# Patient Record
Sex: Female | Born: 1955 | Race: White | Hispanic: No | Marital: Married | Smoking: Former smoker
Health system: Southern US, Community
[De-identification: ages and names within clinical notes are randomized; demographics above are authoritative.]

## PROBLEM LIST (undated history)

## (undated) DIAGNOSIS — R011 Cardiac murmur, unspecified: Secondary | ICD-10-CM

## (undated) DIAGNOSIS — I219 Acute myocardial infarction, unspecified: Secondary | ICD-10-CM

## (undated) DIAGNOSIS — I2510 Atherosclerotic heart disease of native coronary artery without angina pectoris: Secondary | ICD-10-CM

## (undated) DIAGNOSIS — I208 Other forms of angina pectoris: Secondary | ICD-10-CM

## (undated) HISTORY — PX: STENT PLACEMENT VASCULAR (ARMC HX): HXRAD1737

## (undated) HISTORY — PX: ABDOMINAL HYSTERECTOMY: SHX81

## (undated) HISTORY — PX: BACK SURGERY: SHX140

---

## 2001-02-18 ENCOUNTER — Encounter: Payer: Self-pay | Admitting: Orthopaedic Surgery

## 2001-02-18 ENCOUNTER — Encounter: Admission: RE | Admit: 2001-02-18 | Discharge: 2001-02-18 | Payer: Self-pay | Admitting: Orthopaedic Surgery

## 2001-03-04 ENCOUNTER — Encounter: Admission: RE | Admit: 2001-03-04 | Discharge: 2001-03-04 | Payer: Self-pay | Admitting: Orthopaedic Surgery

## 2001-03-04 ENCOUNTER — Encounter: Payer: Self-pay | Admitting: Orthopaedic Surgery

## 2001-03-25 ENCOUNTER — Encounter: Payer: Self-pay | Admitting: Orthopaedic Surgery

## 2001-03-25 ENCOUNTER — Encounter: Admission: RE | Admit: 2001-03-25 | Discharge: 2001-03-25 | Payer: Self-pay | Admitting: Orthopaedic Surgery

## 2001-09-09 ENCOUNTER — Encounter: Payer: Self-pay | Admitting: Neurosurgery

## 2001-09-09 ENCOUNTER — Encounter: Admission: RE | Admit: 2001-09-09 | Discharge: 2001-09-09 | Payer: Self-pay | Admitting: Neurosurgery

## 2001-11-06 ENCOUNTER — Encounter: Payer: Self-pay | Admitting: Neurosurgery

## 2001-11-11 ENCOUNTER — Inpatient Hospital Stay (HOSPITAL_COMMUNITY): Admission: RE | Admit: 2001-11-11 | Discharge: 2001-11-12 | Payer: Self-pay | Admitting: Neurosurgery

## 2001-11-11 ENCOUNTER — Encounter: Payer: Self-pay | Admitting: Neurosurgery

## 2001-12-24 ENCOUNTER — Encounter: Payer: Self-pay | Admitting: Neurosurgery

## 2001-12-24 ENCOUNTER — Encounter: Admission: RE | Admit: 2001-12-24 | Discharge: 2001-12-24 | Payer: Self-pay | Admitting: Neurosurgery

## 2002-02-24 ENCOUNTER — Encounter: Admission: RE | Admit: 2002-02-24 | Discharge: 2002-02-24 | Payer: Self-pay | Admitting: Neurosurgery

## 2002-02-24 ENCOUNTER — Encounter: Payer: Self-pay | Admitting: Neurosurgery

## 2002-03-21 ENCOUNTER — Emergency Department (HOSPITAL_COMMUNITY): Admission: EM | Admit: 2002-03-21 | Discharge: 2002-03-21 | Payer: Self-pay | Admitting: Emergency Medicine

## 2002-03-24 ENCOUNTER — Emergency Department (HOSPITAL_COMMUNITY): Admission: EM | Admit: 2002-03-24 | Discharge: 2002-03-24 | Payer: Self-pay | Admitting: *Deleted

## 2002-03-24 ENCOUNTER — Encounter: Payer: Self-pay | Admitting: *Deleted

## 2002-04-30 ENCOUNTER — Inpatient Hospital Stay (HOSPITAL_COMMUNITY): Admission: EM | Admit: 2002-04-30 | Discharge: 2002-05-01 | Payer: Self-pay | Admitting: Cardiology

## 2002-04-30 HISTORY — PX: CORONARY ANGIOPLASTY: SHX604

## 2002-05-05 ENCOUNTER — Inpatient Hospital Stay (HOSPITAL_COMMUNITY): Admission: EM | Admit: 2002-05-05 | Discharge: 2002-05-06 | Payer: Self-pay | Admitting: Internal Medicine

## 2002-07-09 ENCOUNTER — Encounter: Admission: RE | Admit: 2002-07-09 | Discharge: 2002-07-09 | Payer: Self-pay | Admitting: Neurosurgery

## 2002-07-09 ENCOUNTER — Encounter: Payer: Self-pay | Admitting: Neurosurgery

## 2003-05-13 ENCOUNTER — Encounter: Admission: RE | Admit: 2003-05-13 | Discharge: 2003-05-13 | Payer: Self-pay | Admitting: Orthopedic Surgery

## 2003-12-16 ENCOUNTER — Ambulatory Visit (HOSPITAL_COMMUNITY): Admission: RE | Admit: 2003-12-16 | Discharge: 2003-12-16 | Payer: Self-pay | Admitting: Orthopedic Surgery

## 2004-05-09 ENCOUNTER — Encounter
Admission: RE | Admit: 2004-05-09 | Discharge: 2004-05-09 | Payer: Self-pay | Admitting: Physical Medicine and Rehabilitation

## 2010-07-07 ENCOUNTER — Ambulatory Visit
Admission: RE | Admit: 2010-07-07 | Discharge: 2010-07-07 | Disposition: A | Discharge status: Home / Self Care | Payer: Self-pay | Source: Ambulatory Visit | Attending: Neurosurgery | Admitting: Neurosurgery

## 2010-07-07 ENCOUNTER — Other Ambulatory Visit: Payer: Self-pay | Admitting: Neurosurgery

## 2010-07-07 DIAGNOSIS — M549 Dorsalgia, unspecified: Secondary | ICD-10-CM

## 2021-08-27 ENCOUNTER — Encounter (HOSPITAL_COMMUNITY)
Admission: EM | Disposition: A | Discharge status: Rehabilitation Facility | Payer: Self-pay | Source: Home / Self Care | Attending: Neurosurgery

## 2021-08-27 ENCOUNTER — Inpatient Hospital Stay (HOSPITAL_COMMUNITY): Payer: Medicare Other

## 2021-08-27 ENCOUNTER — Encounter (HOSPITAL_COMMUNITY): Payer: Self-pay | Admitting: Anesthesiology

## 2021-08-27 ENCOUNTER — Emergency Department (HOSPITAL_COMMUNITY): Payer: Medicare Other

## 2021-08-27 ENCOUNTER — Encounter (HOSPITAL_COMMUNITY): Payer: Self-pay | Admitting: Certified Registered Nurse Anesthetist

## 2021-08-27 ENCOUNTER — Encounter (HOSPITAL_COMMUNITY): Payer: Self-pay

## 2021-08-27 ENCOUNTER — Inpatient Hospital Stay (HOSPITAL_COMMUNITY): Payer: Medicare Other | Admitting: Anesthesiology

## 2021-08-27 ENCOUNTER — Other Ambulatory Visit: Payer: Self-pay

## 2021-08-27 ENCOUNTER — Inpatient Hospital Stay (HOSPITAL_COMMUNITY)
Admission: EM | Admit: 2021-08-27 | Discharge: 2021-09-12 | DRG: 020 | Disposition: A | Discharge status: Rehabilitation Facility | Payer: Medicare Other | Attending: Neurosurgery | Admitting: Neurosurgery

## 2021-08-27 DIAGNOSIS — Z880 Allergy status to penicillin: Secondary | ICD-10-CM

## 2021-08-27 DIAGNOSIS — E8729 Other acidosis: Secondary | ICD-10-CM | POA: Diagnosis not present

## 2021-08-27 DIAGNOSIS — Z9911 Dependence on respirator [ventilator] status: Secondary | ICD-10-CM

## 2021-08-27 DIAGNOSIS — I6201 Nontraumatic acute subdural hemorrhage: Secondary | ICD-10-CM | POA: Diagnosis present

## 2021-08-27 DIAGNOSIS — R410 Disorientation, unspecified: Secondary | ICD-10-CM

## 2021-08-27 DIAGNOSIS — D539 Nutritional anemia, unspecified: Secondary | ICD-10-CM | POA: Diagnosis not present

## 2021-08-27 DIAGNOSIS — E669 Obesity, unspecified: Secondary | ICD-10-CM | POA: Diagnosis present

## 2021-08-27 DIAGNOSIS — E878 Other disorders of electrolyte and fluid balance, not elsewhere classified: Secondary | ICD-10-CM | POA: Diagnosis present

## 2021-08-27 DIAGNOSIS — R1310 Dysphagia, unspecified: Secondary | ICD-10-CM | POA: Diagnosis present

## 2021-08-27 DIAGNOSIS — E875 Hyperkalemia: Secondary | ICD-10-CM | POA: Diagnosis not present

## 2021-08-27 DIAGNOSIS — Z7982 Long term (current) use of aspirin: Secondary | ICD-10-CM

## 2021-08-27 DIAGNOSIS — E162 Hypoglycemia, unspecified: Secondary | ICD-10-CM | POA: Diagnosis not present

## 2021-08-27 DIAGNOSIS — F5101 Primary insomnia: Secondary | ICD-10-CM | POA: Diagnosis not present

## 2021-08-27 DIAGNOSIS — I209 Angina pectoris, unspecified: Secondary | ICD-10-CM

## 2021-08-27 DIAGNOSIS — R7989 Other specified abnormal findings of blood chemistry: Secondary | ICD-10-CM | POA: Diagnosis not present

## 2021-08-27 DIAGNOSIS — Z9181 History of falling: Secondary | ICD-10-CM

## 2021-08-27 DIAGNOSIS — G936 Cerebral edema: Secondary | ICD-10-CM | POA: Diagnosis present

## 2021-08-27 DIAGNOSIS — I6032 Nontraumatic subarachnoid hemorrhage from left posterior communicating artery: Secondary | ICD-10-CM | POA: Diagnosis present

## 2021-08-27 DIAGNOSIS — S065XAA Traumatic subdural hemorrhage with loss of consciousness status unknown, initial encounter: Secondary | ICD-10-CM

## 2021-08-27 DIAGNOSIS — R0602 Shortness of breath: Secondary | ICD-10-CM | POA: Diagnosis not present

## 2021-08-27 DIAGNOSIS — T17908A Unspecified foreign body in respiratory tract, part unspecified causing other injury, initial encounter: Secondary | ICD-10-CM

## 2021-08-27 DIAGNOSIS — J690 Pneumonitis due to inhalation of food and vomit: Secondary | ICD-10-CM | POA: Diagnosis not present

## 2021-08-27 DIAGNOSIS — J9601 Acute respiratory failure with hypoxia: Secondary | ICD-10-CM

## 2021-08-27 DIAGNOSIS — I639 Cerebral infarction, unspecified: Secondary | ICD-10-CM | POA: Diagnosis not present

## 2021-08-27 DIAGNOSIS — R569 Unspecified convulsions: Secondary | ICD-10-CM | POA: Diagnosis present

## 2021-08-27 DIAGNOSIS — G8191 Hemiplegia, unspecified affecting right dominant side: Secondary | ICD-10-CM | POA: Diagnosis present

## 2021-08-27 DIAGNOSIS — Z79899 Other long term (current) drug therapy: Secondary | ICD-10-CM

## 2021-08-27 DIAGNOSIS — D6959 Other secondary thrombocytopenia: Secondary | ICD-10-CM | POA: Diagnosis not present

## 2021-08-27 DIAGNOSIS — I609 Nontraumatic subarachnoid hemorrhage, unspecified: Secondary | ICD-10-CM | POA: Diagnosis present

## 2021-08-27 DIAGNOSIS — R1312 Dysphagia, oropharyngeal phase: Secondary | ICD-10-CM | POA: Diagnosis not present

## 2021-08-27 DIAGNOSIS — G40909 Epilepsy, unspecified, not intractable, without status epilepticus: Secondary | ICD-10-CM | POA: Diagnosis not present

## 2021-08-27 DIAGNOSIS — Z6829 Body mass index (BMI) 29.0-29.9, adult: Secondary | ICD-10-CM

## 2021-08-27 DIAGNOSIS — J9621 Acute and chronic respiratory failure with hypoxia: Secondary | ICD-10-CM | POA: Diagnosis not present

## 2021-08-27 DIAGNOSIS — R197 Diarrhea, unspecified: Secondary | ICD-10-CM | POA: Diagnosis not present

## 2021-08-27 DIAGNOSIS — G9349 Other encephalopathy: Secondary | ICD-10-CM | POA: Diagnosis present

## 2021-08-27 DIAGNOSIS — G441 Vascular headache, not elsewhere classified: Secondary | ICD-10-CM | POA: Diagnosis not present

## 2021-08-27 DIAGNOSIS — R001 Bradycardia, unspecified: Secondary | ICD-10-CM | POA: Diagnosis not present

## 2021-08-27 DIAGNOSIS — G935 Compression of brain: Secondary | ICD-10-CM | POA: Diagnosis present

## 2021-08-27 DIAGNOSIS — S066XAA Traumatic subarachnoid hemorrhage with loss of consciousness status unknown, initial encounter: Secondary | ICD-10-CM

## 2021-08-27 DIAGNOSIS — R630 Anorexia: Secondary | ICD-10-CM | POA: Diagnosis not present

## 2021-08-27 DIAGNOSIS — R739 Hyperglycemia, unspecified: Secondary | ICD-10-CM | POA: Diagnosis present

## 2021-08-27 DIAGNOSIS — D638 Anemia in other chronic diseases classified elsewhere: Secondary | ICD-10-CM | POA: Diagnosis present

## 2021-08-27 DIAGNOSIS — I608 Other nontraumatic subarachnoid hemorrhage: Secondary | ICD-10-CM | POA: Diagnosis not present

## 2021-08-27 DIAGNOSIS — I619 Nontraumatic intracerebral hemorrhage, unspecified: Secondary | ICD-10-CM

## 2021-08-27 DIAGNOSIS — G479 Sleep disorder, unspecified: Secondary | ICD-10-CM | POA: Diagnosis not present

## 2021-08-27 DIAGNOSIS — E876 Hypokalemia: Secondary | ICD-10-CM | POA: Diagnosis not present

## 2021-08-27 DIAGNOSIS — F17210 Nicotine dependence, cigarettes, uncomplicated: Secondary | ICD-10-CM | POA: Diagnosis present

## 2021-08-27 DIAGNOSIS — R29733 NIHSS score 33: Secondary | ICD-10-CM | POA: Diagnosis present

## 2021-08-27 DIAGNOSIS — I610 Nontraumatic intracerebral hemorrhage in hemisphere, subcortical: Secondary | ICD-10-CM | POA: Diagnosis not present

## 2021-08-27 DIAGNOSIS — R451 Restlessness and agitation: Secondary | ICD-10-CM | POA: Diagnosis not present

## 2021-08-27 DIAGNOSIS — Z781 Physical restraint status: Secondary | ICD-10-CM

## 2021-08-27 DIAGNOSIS — Z885 Allergy status to narcotic agent status: Secondary | ICD-10-CM

## 2021-08-27 DIAGNOSIS — R443 Hallucinations, unspecified: Secondary | ICD-10-CM | POA: Diagnosis not present

## 2021-08-27 DIAGNOSIS — G4089 Other seizures: Secondary | ICD-10-CM | POA: Diagnosis not present

## 2021-08-27 DIAGNOSIS — F05 Delirium due to known physiological condition: Secondary | ICD-10-CM | POA: Diagnosis not present

## 2021-08-27 DIAGNOSIS — I611 Nontraumatic intracerebral hemorrhage in hemisphere, cortical: Secondary | ICD-10-CM | POA: Diagnosis not present

## 2021-08-27 DIAGNOSIS — L270 Generalized skin eruption due to drugs and medicaments taken internally: Secondary | ICD-10-CM | POA: Diagnosis not present

## 2021-08-27 HISTORY — PX: IR ANGIOGRAM FOLLOW UP STUDY: IMG697

## 2021-08-27 HISTORY — PX: RADIOLOGY WITH ANESTHESIA: SHX6223

## 2021-08-27 HISTORY — PX: IR ANGIO INTRA EXTRACRAN SEL COM CAROTID INNOMINATE UNI R MOD SED: IMG5359

## 2021-08-27 HISTORY — PX: IR TRANSCATH/EMBOLIZ: IMG695

## 2021-08-27 HISTORY — PX: CRANIOTOMY: SHX93

## 2021-08-27 HISTORY — DX: Other forms of angina pectoris: I20.8

## 2021-08-27 HISTORY — DX: Nontraumatic intracerebral hemorrhage, unspecified: I61.9

## 2021-08-27 HISTORY — PX: IR CT HEAD LTD: IMG2386

## 2021-08-27 LAB — RAPID URINE DRUG SCREEN, HOSP PERFORMED
Amphetamines: NOT DETECTED
Barbiturates: NOT DETECTED
Benzodiazepines: NOT DETECTED
Cocaine: NOT DETECTED
Opiates: NOT DETECTED
Tetrahydrocannabinol: NOT DETECTED

## 2021-08-27 LAB — COMPREHENSIVE METABOLIC PANEL
ALT: 23 U/L (ref 0–44)
AST: 29 U/L (ref 15–41)
Albumin: 4 g/dL (ref 3.5–5.0)
Alkaline Phosphatase: 79 U/L (ref 38–126)
Anion gap: 11 (ref 5–15)
BUN: 15 mg/dL (ref 8–23)
CO2: 22 mmol/L (ref 22–32)
Calcium: 8.7 mg/dL — ABNORMAL LOW (ref 8.9–10.3)
Chloride: 104 mmol/L (ref 98–111)
Creatinine, Ser: 0.77 mg/dL (ref 0.44–1.00)
GFR, Estimated: >60 mL/min (ref >60)
Glucose, Bld: 246 mg/dL — ABNORMAL HIGH (ref 70–99)
Potassium: 3.7 mmol/L (ref 3.5–5.1)
Sodium: 137 mmol/L (ref 135–145)
Total Bilirubin: 0.7 mg/dL (ref 0.3–1.2)
Total Protein: 6.9 g/dL (ref 6.5–8.1)

## 2021-08-27 LAB — CBC WITH DIFFERENTIAL/PLATELET
Abs Immature Granulocytes: 0.11 10*3/uL — ABNORMAL HIGH (ref 0.00–0.07)
Basophils Absolute: 0.1 10*3/uL (ref 0.0–0.1)
Basophils Relative: 0 %
Eosinophils Absolute: 0 10*3/uL (ref 0.0–0.5)
Eosinophils Relative: 0 %
HCT: 46.7 % — ABNORMAL HIGH (ref 36.0–46.0)
Hemoglobin: 15.7 g/dL — ABNORMAL HIGH (ref 12.0–15.0)
Immature Granulocytes: 1 %
Lymphocytes Relative: 12 %
Lymphs Abs: 1.4 10*3/uL (ref 0.7–4.0)
MCH: 32.2 pg (ref 26.0–34.0)
MCHC: 33.6 g/dL (ref 30.0–36.0)
MCV: 95.7 fL (ref 80.0–100.0)
Monocytes Absolute: 0.4 10*3/uL (ref 0.1–1.0)
Monocytes Relative: 3 %
Neutro Abs: 9.3 10*3/uL — ABNORMAL HIGH (ref 1.7–7.7)
Neutrophils Relative %: 84 %
Platelets: 233 10*3/uL (ref 150–400)
RBC: 4.88 MIL/uL (ref 3.87–5.11)
RDW: 12.4 % (ref 11.5–15.5)
WBC: 11.2 10*3/uL — ABNORMAL HIGH (ref 4.0–10.5)
nRBC: 0 % (ref 0.0–0.2)

## 2021-08-27 LAB — CBG MONITORING, ED
Glucose-Capillary: 214 mg/dL — ABNORMAL HIGH (ref 70–99)
Glucose-Capillary: 193 mg/dL — ABNORMAL HIGH (ref 70–99)
Glucose-Capillary: 154 mg/dL — ABNORMAL HIGH (ref 70–99)

## 2021-08-27 LAB — URINALYSIS, ROUTINE W REFLEX MICROSCOPIC
Bacteria, UA: NONE SEEN
Bilirubin Urine: NEGATIVE
Glucose, UA: 150 mg/dL — AB
Ketones, ur: 20 mg/dL — AB
Leukocytes,Ua: NEGATIVE
Nitrite: NEGATIVE
Protein, ur: 30 mg/dL — AB
Specific Gravity, Urine: 1.019 (ref 1.005–1.030)
pH: 6 (ref 5.0–8.0)

## 2021-08-27 LAB — TROPONIN I (HIGH SENSITIVITY)
Troponin I (High Sensitivity): 5 ng/L (ref <18)
Troponin I (High Sensitivity): 8 ng/L (ref <18)

## 2021-08-27 LAB — POCT ACTIVATED CLOTTING TIME: Activated Clotting Time: 197 seconds

## 2021-08-27 LAB — TYPE AND SCREEN
ABO/RH(D): B POS
Antibody Screen: NEGATIVE

## 2021-08-27 LAB — SODIUM: Sodium: 138 mmol/L (ref 135–145)

## 2021-08-27 LAB — PROTIME-INR
INR: 1.3 — ABNORMAL HIGH (ref 0.8–1.2)
Prothrombin Time: 16.4 seconds — ABNORMAL HIGH (ref 11.4–15.2)

## 2021-08-27 LAB — HIV ANTIBODY (ROUTINE TESTING W REFLEX): HIV Screen 4th Generation wRfx: NONREACTIVE

## 2021-08-27 SURGERY — RADIOLOGY WITH ANESTHESIA
Anesthesia: General

## 2021-08-27 SURGERY — IR WITH ANESTHESIA
Anesthesia: Choice

## 2021-08-27 SURGERY — CRANIOTOMY HEMATOMA EVACUATION SUBDURAL
Anesthesia: General | Site: Head | Laterality: Left

## 2021-08-27 MED ORDER — SODIUM CHLORIDE 3 % IV BOLUS
250.0000 mL | Freq: Once | INTRAVENOUS | Status: AC
  Administered 2021-08-27: 250 mL via INTRAVENOUS
  Filled 2021-08-27: qty 500

## 2021-08-27 MED ORDER — CLEVIDIPINE BUTYRATE 0.5 MG/ML IV EMUL: 0.0000 mg/h | INTRAVENOUS | Status: DC

## 2021-08-27 MED ORDER — STROKE: EARLY STAGES OF RECOVERY BOOK
Freq: Once | Status: AC
  Filled 2021-08-27: qty 1

## 2021-08-27 MED ORDER — FENTANYL CITRATE PF 50 MCG/ML IJ SOSY: 50.0000 ug | PREFILLED_SYRINGE | INTRAMUSCULAR | Status: DC | PRN

## 2021-08-27 MED ORDER — NIMODIPINE 6 MG/ML PO SOLN
60.0000 mg | ORAL | Status: DC
  Administered 2021-08-28 (×2): 60 mg
  Filled 2021-08-27 (×2): qty 10

## 2021-08-27 MED ORDER — IOHEXOL 350 MG/ML SOLN
100.0000 mL | Freq: Once | INTRAVENOUS | Status: AC | PRN
  Administered 2021-08-27: 100 mL via INTRAVENOUS

## 2021-08-27 MED ORDER — ONDANSETRON HCL 4 MG/2ML IJ SOLN
INTRAMUSCULAR | Status: AC
  Filled 2021-08-27: qty 2

## 2021-08-27 MED ORDER — HEPARIN SODIUM (PORCINE) 1000 UNIT/ML IJ SOLN
INTRAMUSCULAR | Status: DC | PRN
  Administered 2021-08-27 (×2): 1000 [IU] via INTRAVENOUS

## 2021-08-27 MED ORDER — ONDANSETRON HCL 4 MG/2ML IJ SOLN
INTRAMUSCULAR | Status: DC | PRN
  Administered 2021-08-27: 4 mg via INTRAVENOUS

## 2021-08-27 MED ORDER — ROCURONIUM BROMIDE 10 MG/ML (PF) SYRINGE
PREFILLED_SYRINGE | INTRAVENOUS | Status: AC
  Filled 2021-08-27: qty 10

## 2021-08-27 MED ORDER — SUCCINYLCHOLINE CHLORIDE 200 MG/10ML IV SOSY
PREFILLED_SYRINGE | INTRAVENOUS | Status: AC
  Administered 2021-08-27: 120 mg via INTRAVENOUS
  Filled 2021-08-27: qty 10

## 2021-08-27 MED ORDER — MANNITOL 20 % IV SOLN
80.0000 g | Freq: Once | INTRAVENOUS | Status: DC
  Filled 2021-08-27: qty 500

## 2021-08-27 MED ORDER — KETAMINE HCL 50 MG/5ML IJ SOSY
PREFILLED_SYRINGE | INTRAMUSCULAR | Status: AC
  Filled 2021-08-27: qty 5

## 2021-08-27 MED ORDER — FENTANYL CITRATE PF 50 MCG/ML IJ SOSY
50.0000 ug | PREFILLED_SYRINGE | Freq: Once | INTRAMUSCULAR | Status: AC
  Administered 2021-08-27: 50 ug via INTRAVENOUS
  Filled 2021-08-27: qty 1

## 2021-08-27 MED ORDER — CLEVIDIPINE BUTYRATE 0.5 MG/ML IV EMUL
INTRAVENOUS | Status: AC
  Filled 2021-08-27: qty 50

## 2021-08-27 MED ORDER — PROPOFOL 10 MG/ML IV BOLUS
INTRAVENOUS | Status: DC | PRN
  Administered 2021-08-27: 50 mg via INTRAVENOUS

## 2021-08-27 MED ORDER — PANTOPRAZOLE 2 MG/ML SUSPENSION
40.0000 mg | Freq: Every day | ORAL | Status: DC
  Filled 2021-08-27: qty 20

## 2021-08-27 MED ORDER — SODIUM CHLORIDE 3 % IV SOLN
INTRAVENOUS | Status: AC
  Filled 2021-08-27: qty 500

## 2021-08-27 MED ORDER — IOHEXOL 300 MG/ML  SOLN
100.0000 mL | Freq: Once | INTRAMUSCULAR | Status: AC | PRN
  Administered 2021-08-27: 10 mL via INTRA_ARTERIAL

## 2021-08-27 MED ORDER — LEVETIRACETAM IN NACL 1000 MG/100ML IV SOLN
1000.0000 mg | Freq: Two times a day (BID) | INTRAVENOUS | Status: DC
  Administered 2021-08-28 – 2021-09-02 (×11): 1000 mg via INTRAVENOUS
  Filled 2021-08-27 (×11): qty 100

## 2021-08-27 MED ORDER — FENTANYL CITRATE (PF) 250 MCG/5ML IJ SOLN
INTRAMUSCULAR | Status: AC
  Filled 2021-08-27: qty 5

## 2021-08-27 MED ORDER — LORAZEPAM 2 MG/ML IJ SOLN
INTRAMUSCULAR | Status: AC
  Filled 2021-08-27: qty 1

## 2021-08-27 MED ORDER — LIDOCAINE-EPINEPHRINE 1 %-1:100000 IJ SOLN
INTRAMUSCULAR | Status: DC | PRN
  Administered 2021-08-27: 10 mL

## 2021-08-27 MED ORDER — ROCURONIUM BROMIDE 10 MG/ML (PF) SYRINGE
PREFILLED_SYRINGE | INTRAVENOUS | Status: DC | PRN
  Administered 2021-08-27: 50 mg via INTRAVENOUS
  Administered 2021-08-27: 30 mg via INTRAVENOUS
  Administered 2021-08-27: 50 mg via INTRAVENOUS

## 2021-08-27 MED ORDER — IOHEXOL 300 MG/ML  SOLN
100.0000 mL | Freq: Once | INTRAMUSCULAR | Status: AC | PRN
  Administered 2021-08-27: 60 mL via INTRA_ARTERIAL

## 2021-08-27 MED ORDER — PROPOFOL 1000 MG/100ML IV EMUL
5.0000 ug/kg/min | INTRAVENOUS | Status: DC
  Administered 2021-08-27: 5 ug/kg/min via INTRAVENOUS
  Administered 2021-08-28: 40 ug/kg/min via INTRAVENOUS
  Administered 2021-08-28 (×2): 70 ug/kg/min via INTRAVENOUS
  Administered 2021-08-28: 20 ug/kg/min via INTRAVENOUS
  Administered 2021-08-28: 70 ug/kg/min via INTRAVENOUS
  Administered 2021-08-28: 40 ug/kg/min via INTRAVENOUS
  Administered 2021-08-29: 50 ug/kg/min via INTRAVENOUS
  Filled 2021-08-27 (×9): qty 100

## 2021-08-27 MED ORDER — GELATIN ABSORBABLE 100 EX MISC: CUTANEOUS | Status: DC | PRN

## 2021-08-27 MED ORDER — SODIUM CHLORIDE 0.9 % IV SOLN: INTRAVENOUS | Status: DC | PRN

## 2021-08-27 MED ORDER — BACITRACIN ZINC 500 UNIT/GM EX OINT
TOPICAL_OINTMENT | CUTANEOUS | Status: DC | PRN
  Administered 2021-08-27: 1 via TOPICAL

## 2021-08-27 MED ORDER — PHENYLEPHRINE 80 MCG/ML (10ML) SYRINGE FOR IV PUSH (FOR BLOOD PRESSURE SUPPORT)
PREFILLED_SYRINGE | INTRAVENOUS | Status: AC
  Filled 2021-08-27: qty 10

## 2021-08-27 MED ORDER — ETOMIDATE 2 MG/ML IV SOLN
INTRAVENOUS | Status: AC
  Administered 2021-08-27: 20 mg via INTRAVENOUS
  Filled 2021-08-27: qty 20

## 2021-08-27 MED ORDER — SODIUM CHLORIDE 0.9 % IV SOLN: INTRAVENOUS | Status: DC

## 2021-08-27 MED ORDER — MIDAZOLAM HCL 2 MG/2ML IJ SOLN
INTRAMUSCULAR | Status: AC
  Filled 2021-08-27: qty 2

## 2021-08-27 MED ORDER — LEVETIRACETAM IN NACL 1000 MG/100ML IV SOLN
INTRAVENOUS | Status: AC
  Administered 2021-08-27: 1000 mg via INTRAVENOUS
  Filled 2021-08-27: qty 200

## 2021-08-27 MED ORDER — IOHEXOL 300 MG/ML  SOLN
100.0000 mL | Freq: Once | INTRAMUSCULAR | Status: AC | PRN
  Administered 2021-08-27: 70 mL via INTRA_ARTERIAL

## 2021-08-27 MED ORDER — FENTANYL CITRATE PF 50 MCG/ML IJ SOSY
50.0000 ug | PREFILLED_SYRINGE | Freq: Once | INTRAMUSCULAR | Status: AC
  Administered 2021-08-27: 50 ug via INTRAVENOUS

## 2021-08-27 MED ORDER — ETOMIDATE 2 MG/ML IV SOLN: 20.0000 mg | Freq: Once | INTRAVENOUS | Status: AC

## 2021-08-27 MED ORDER — VANCOMYCIN HCL 1000 MG IV SOLR
INTRAVENOUS | Status: DC | PRN
  Administered 2021-08-27: 1000 mg via INTRAVENOUS

## 2021-08-27 MED ORDER — SUCCINYLCHOLINE CHLORIDE 200 MG/10ML IV SOSY: 120.0000 mg | PREFILLED_SYRINGE | Freq: Once | INTRAVENOUS | Status: AC

## 2021-08-27 MED ORDER — NIMODIPINE 30 MG PO CAPS: 60.0000 mg | ORAL_CAPSULE | ORAL | Status: DC

## 2021-08-27 MED ORDER — PHENYLEPHRINE 80 MCG/ML (10ML) SYRINGE FOR IV PUSH (FOR BLOOD PRESSURE SUPPORT)
PREFILLED_SYRINGE | INTRAVENOUS | Status: DC | PRN
  Administered 2021-08-27 (×2): 80 ug via INTRAVENOUS

## 2021-08-27 MED ORDER — FENTANYL CITRATE PF 50 MCG/ML IJ SOSY
PREFILLED_SYRINGE | INTRAMUSCULAR | Status: AC
  Filled 2021-08-27: qty 2

## 2021-08-27 MED ORDER — ACETAMINOPHEN 325 MG PO TABS: 650.0000 mg | ORAL_TABLET | ORAL | Status: DC | PRN

## 2021-08-27 MED ORDER — SODIUM CHLORIDE 3 % IV SOLN
INTRAVENOUS | Status: DC
  Administered 2021-08-27: 75 mL/h via INTRAVENOUS
  Filled 2021-08-27 (×4): qty 500

## 2021-08-27 MED ORDER — VANCOMYCIN HCL IN DEXTROSE 1-5 GM/200ML-% IV SOLN
INTRAVENOUS | Status: AC
  Filled 2021-08-27: qty 200

## 2021-08-27 MED ORDER — FENTANYL CITRATE (PF) 250 MCG/5ML IJ SOLN
INTRAMUSCULAR | Status: DC | PRN
  Administered 2021-08-27: 50 ug via INTRAVENOUS

## 2021-08-27 MED ORDER — ONDANSETRON HCL 4 MG/2ML IJ SOLN
4.0000 mg | Freq: Once | INTRAMUSCULAR | Status: AC
  Administered 2021-08-27: 4 mg via INTRAVENOUS
  Filled 2021-08-27: qty 2

## 2021-08-27 MED ORDER — CLEVIDIPINE BUTYRATE 0.5 MG/ML IV EMUL
INTRAVENOUS | Status: AC
  Administered 2021-08-27: 2 mg/h via INTRAVENOUS
  Filled 2021-08-27: qty 50

## 2021-08-27 MED ORDER — LEVETIRACETAM IN NACL 1000 MG/100ML IV SOLN
1000.0000 mg | Freq: Once | INTRAVENOUS | Status: AC
  Administered 2021-08-27: 1000 mg via INTRAVENOUS

## 2021-08-27 MED ORDER — ONDANSETRON HCL 4 MG/2ML IJ SOLN: 4.0000 mg | Freq: Four times a day (QID) | INTRAMUSCULAR | Status: DC | PRN

## 2021-08-27 MED ORDER — 0.9 % SODIUM CHLORIDE (POUR BTL) OPTIME
TOPICAL | Status: DC | PRN
Start: 2021-08-27 — End: 2021-08-27
  Administered 2021-08-27: 3000 mL

## 2021-08-27 MED ORDER — HEMOSTATIC AGENTS (NO CHARGE) OPTIME
TOPICAL | Status: DC | PRN
  Administered 2021-08-27: 1 via TOPICAL

## 2021-08-27 MED ORDER — LEVETIRACETAM IN NACL 1000 MG/100ML IV SOLN: 1000.0000 mg | Freq: Once | INTRAVENOUS | Status: AC

## 2021-08-27 MED ORDER — INSULIN ASPART 100 UNIT/ML IJ SOLN
2.0000 [IU] | INTRAMUSCULAR | Status: DC
  Administered 2021-08-28 (×5): 2 [IU] via SUBCUTANEOUS
  Administered 2021-08-28 – 2021-08-29 (×4): 4 [IU] via SUBCUTANEOUS
  Administered 2021-08-29: 2 [IU] via SUBCUTANEOUS
  Administered 2021-08-29: 4 [IU] via SUBCUTANEOUS

## 2021-08-27 SURGICAL SUPPLY — 60 items
ADH SKN CLS APL DERMABOND .7 (GAUZE/BANDAGES/DRESSINGS) ×1
BAG COUNTER SPONGE SURGICOUNT (BAG) ×4 IMPLANT
BAG SPNG CNTER NS LX DISP (BAG) ×2
BLADE CLIPPER SURG (BLADE) ×1 IMPLANT
BLADE SURG 11 STRL SS (BLADE) ×3 IMPLANT
BNDG CMPR 75X41 PLY ABS (GAUZE/BANDAGES/DRESSINGS) ×1
BNDG GAUZE ELAST 4 BULKY (GAUZE/BANDAGES/DRESSINGS) ×1 IMPLANT
BNDG STRETCH 4X75 NS LF (GAUZE/BANDAGES/DRESSINGS) ×1 IMPLANT
BUR ACORN 9.0 PRECISION (BURR) ×3 IMPLANT
BUR SPIRAL ROUTER 2.3 (BUR) ×1 IMPLANT
CANISTER SUCT 3000ML PPV (MISCELLANEOUS) ×3 IMPLANT
CARTRIDGE OIL MAESTRO DRILL (MISCELLANEOUS) ×2 IMPLANT
DERMABOND ADVANCED (GAUZE/BANDAGES/DRESSINGS) ×1
DERMABOND ADVANCED .7 DNX12 (GAUZE/BANDAGES/DRESSINGS) ×2 IMPLANT
DIFFUSER DRILL AIR PNEUMATIC (MISCELLANEOUS) ×3 IMPLANT
DRAIN JACKSON PRT FLT 7MM (DRAIN) ×1 IMPLANT
DRAPE NEUROLOGICAL W/INCISE (DRAPES) ×3 IMPLANT
DRAPE SURG 17X23 STRL (DRAPES) IMPLANT
DRAPE WARM FLUID 44X44 (DRAPES) ×3 IMPLANT
DRSG OPSITE 4X5.5 SM (GAUZE/BANDAGES/DRESSINGS) ×1 IMPLANT
DRSG OPSITE POSTOP 4X6 (GAUZE/BANDAGES/DRESSINGS) ×1 IMPLANT
ELECT REM PT RETURN 9FT ADLT (ELECTROSURGICAL) ×2
ELECTRODE REM PT RTRN 9FT ADLT (ELECTROSURGICAL) ×2 IMPLANT
EVACUATOR SILICONE 100CC (DRAIN) ×1 IMPLANT
GAUZE SPONGE 4X4 12PLY STRL (GAUZE/BANDAGES/DRESSINGS) IMPLANT
GAUZE SPONGE 4X4 12PLY STRL LF (GAUZE/BANDAGES/DRESSINGS) ×1 IMPLANT
GLOVE BIO SURGEON STRL SZ7 (GLOVE) ×1 IMPLANT
GLOVE BIO SURGEON STRL SZ8 (GLOVE) ×3 IMPLANT
GLOVE BIOGEL PI IND STRL 7.0 (GLOVE) IMPLANT
GLOVE BIOGEL PI INDICATOR 7.0 (GLOVE) ×1
GLOVE INDICATOR 8.5 STRL (GLOVE) ×3 IMPLANT
GLOVE SURG UNDER POLY LF SZ8.5 (GLOVE) ×3 IMPLANT
GOWN STRL REUS W/ TWL LRG LVL3 (GOWN DISPOSABLE) ×2 IMPLANT
GOWN STRL REUS W/ TWL XL LVL3 (GOWN DISPOSABLE) ×2 IMPLANT
GOWN STRL REUS W/TWL 2XL LVL3 (GOWN DISPOSABLE) IMPLANT
GOWN STRL REUS W/TWL LRG LVL3 (GOWN DISPOSABLE) ×6
GOWN STRL REUS W/TWL XL LVL3 (GOWN DISPOSABLE) ×2
HEMOSTAT SURGICEL 2X14 (HEMOSTASIS) ×1 IMPLANT
KIT BASIN OR (CUSTOM PROCEDURE TRAY) ×3 IMPLANT
KIT TURNOVER KIT B (KITS) ×3 IMPLANT
NDL HYPO 25X1 1.5 SAFETY (NEEDLE) ×2 IMPLANT
NEEDLE HYPO 25X1 1.5 SAFETY (NEEDLE) ×2 IMPLANT
NS IRRIG 1000ML POUR BTL (IV SOLUTION) ×5 IMPLANT
OIL CARTRIDGE MAESTRO DRILL (MISCELLANEOUS) ×2
PACK CRANIOTOMY CUSTOM (CUSTOM PROCEDURE TRAY) ×3 IMPLANT
PATTIES SURGICAL .25X.25 (GAUZE/BANDAGES/DRESSINGS) IMPLANT
PATTIES SURGICAL .5 X.5 (GAUZE/BANDAGES/DRESSINGS) IMPLANT
PATTIES SURGICAL .5 X3 (DISPOSABLE) IMPLANT
PATTIES SURGICAL 1X1 (DISPOSABLE) IMPLANT
PIN MAYFIELD SKULL DISP (PIN) IMPLANT
SPONGE NEURO XRAY DETECT 1X3 (DISPOSABLE) IMPLANT
SPONGE SURGIFOAM ABS GEL 100 (HEMOSTASIS) ×2 IMPLANT
STAPLER VISISTAT 35W (STAPLE) ×3 IMPLANT
SUT ETHILON 3 0 FSL (SUTURE) IMPLANT
SUT NURALON 4 0 TR CR/8 (SUTURE) ×6 IMPLANT
SUT VIC AB 2-0 CT1 18 (SUTURE) ×4 IMPLANT
TOWEL GREEN STERILE (TOWEL DISPOSABLE) ×3 IMPLANT
TOWEL GREEN STERILE FF (TOWEL DISPOSABLE) ×3 IMPLANT
UNDERPAD 30X36 HEAVY ABSORB (UNDERPADS AND DIAPERS) IMPLANT
WATER STERILE IRR 1000ML POUR (IV SOLUTION) ×3 IMPLANT

## 2021-08-27 NOTE — Anesthesia Preprocedure Evaluation (Addendum)
Anesthesia Evaluation  ?Patient identified by MRN, date of birth, ID band ?Patient unresponsive ? ? ? ?Reviewed: ?Allergy & Precautions, Patient's Chart, lab work & pertinent test results, Unable to perform ROS - Chart review onlyPreop documentation limited or incomplete due to emergent nature of procedure. ? ?Airway ?Mallampati: Intubated ? ? ? ? ? ? Dental ?  ?Pulmonary ?Current Smoker,  ?  ?breath sounds clear to auscultation ? ? ? ? ? ? Cardiovascular ?+ angina at rest  ?Rhythm:Regular Rate:Normal ? ? ?  ?Neuro/Psych ?  ? GI/Hepatic ?  ?Endo/Other  ? ? Renal/GU ?  ? ?  ?Musculoskeletal ? ? Abdominal ?Normal abdominal exam  (+)   ?Peds ? Hematology ?  ?Anesthesia Other Findings ? ? Reproductive/Obstetrics ? ?  ? ? ? ? ? ? ? ? ? ? ? ? ? ?  ?  ? ? ? ? ? ? ? ?Anesthesia Physical ?Anesthesia Plan ? ?ASA: 4 and emergent ? ?Anesthesia Plan: General  ? ?Post-op Pain Management:   ? ?Induction: Inhalational ? ?PONV Risk Score and Plan: 2 and Ondansetron and Dexamethasone ? ?Airway Management Planned: Oral ETT ? ?Additional Equipment: Arterial line ? ?Intra-op Plan:  ? ?Post-operative Plan: Post-operative intubation/ventilation ? ?Informed Consent:  ? ? ? ?History available from chart only and Only emergency history available ? ?Plan Discussed with: CRNA ? ?Anesthesia Plan Comments:   ? ? ? ? ? ?Anesthesia Quick Evaluation ? ?

## 2021-08-27 NOTE — Procedures (Signed)
INR. ?Bilateral common carotid and right vertebral arteriograms. ? ?Right CFA approach. ?Findings. 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 ?Dominant left posterior communicating artery patent. ?Post CT of the brain no worsening of subarachnoid hemorrhage, or of the left convexity subdural hematoma. ?Manual pressure held with quick clot at the right groin puncture site for hemostasis.  Distal pulses all intact bilaterally. ?Neurologically and hemodynamically stable. ? ?Fatima Sanger MD ?

## 2021-08-27 NOTE — ED Notes (Signed)
Dr Denese Killings preparing for central line placement ?

## 2021-08-27 NOTE — Assessment & Plan Note (Signed)
Due to combination of SAH and SDH.  ?Received 250 bolus of 3% saline at [facility] ? ?- Start maintenance 3% infusion, target 150-155 ?- Monitor sodium per protocol. ?

## 2021-08-27 NOTE — ED Notes (Signed)
Patient to CT at this time

## 2021-08-27 NOTE — ED Provider Notes (Signed)
1,224 mg ?Procedure Name: Intubation ?Date/Time: 08/27/2021 4:08 PM ?Performed by: Milton Ferguson, MD ?Pre-anesthesia Checklist: Patient identified ?Oxygen Delivery Method: Non-rebreather mask ?Preoxygenation: Pre-oxygenation with 100% oxygen ?Induction Type: Rapid sequence ?Ventilation: Mask ventilation without difficulty ?Laryngoscope Size: Glidescope ?Grade View: Grade II ?Tube size: 7.5 mm ?Number of attempts: 1 ?Placement Confirmation: ETT inserted through vocal cords under direct vision ?Difficulty Due To: Difficult Airway- due to immobile epiglottis ?Comments:    Pt was intubated with a 7.5 cm et tube.  One attempt.  No difficulties ? ? ?  ?  ?Milton Ferguson, MD ?08/27/21 1611 ? ?

## 2021-08-27 NOTE — ED Notes (Signed)
Dr. Almira Bar at bedside ? ?

## 2021-08-27 NOTE — ED Notes (Signed)
Per [place], [place] patient is to be transferred [facility]. Attempted to call [facility] charge nurse x1.  ?

## 2021-08-27 NOTE — Assessment & Plan Note (Addendum)
For airway protection due to coma from [facility]. ? ?- Full mechanical ventilatory support with lung protective strategy. Limit PEEP to <10 to avoid increasing ICP ?- Titrate ventilation to normal ABG.  ?- Use propofol infusion, prn fentanyl for comfort during procedure.  ?- Consider change to Precedex following surgery if longer-term sedation is needed and not seizing on EEG ?

## 2021-08-27 NOTE — Assessment & Plan Note (Addendum)
Hunt Hess closer to 2-3 initially, mental status worse since seizure. Ongoing non-convulsive status or post-ictal state may be contributing to worse examination.  ? ?- Continue Keppra post load.  ?- Propofol for sedation until status epilepticus ruled out.  ?

## 2021-08-27 NOTE — Assessment & Plan Note (Signed)
Hunt-Hess: 5 following seizure, but initially following commands (rebleed?)  ?Fisher III  - 35 % chance of vasospasm. ? ?- Titrate clevidipine to SBP 120-140 until secured. ?- Catheter aneurysm for possible coiling ?- Following intervention/OR - attempt to wean sedation and obtain best examination.  ?- Start nimodipine to prevent vasospasm ?- Serial TCD's  ?- Maintain euvolemia.  ? ? ?

## 2021-08-27 NOTE — Progress Notes (Signed)
PT bagged to 4N22 by OR staff. Placed pt on vent. Tube secure with bilateral breath sounds. Will continue to monitor.  ?

## 2021-08-27 NOTE — ED Notes (Signed)
Noted patient to have seizure like activity. PA called to bedside. NRB placed on patient, suction and turned on side. Pads previously placed on patient due to her becoming bradycardic with heart rate of 30. ?

## 2021-08-27 NOTE — ED Notes (Signed)
[facility] at bedside 

## 2021-08-27 NOTE — ED Notes (Signed)
Pt to room 27 from ct.  ? ?

## 2021-08-27 NOTE — ED Provider Notes (Signed)
?  Physical Exam  ?BP 133/89   Pulse 94   Temp (!) 92.5 ?F (33.6 ?C)   Resp 20   Ht 5\' 6"  (1.676 m)   Wt 81.6 kg   SpO2 100%   BMI 29.05 kg/m?  ? ?Physical Exam ?Vitals and nursing note reviewed.  ?Constitutional:   ?   General: She is not in acute distress. ?   Appearance: She is well-developed. She is toxic-appearing.  ?HENT:  ?   Head: Normocephalic and atraumatic.  ?Eyes:  ?   Conjunctiva/sclera: Conjunctivae normal.  ?   Comments: Pupils fixed  ?Cardiovascular:  ?   Rate and Rhythm: Normal rate and regular rhythm.  ?   Heart sounds: No murmur heard. ?Pulmonary:  ?   Comments: intubated ?Musculoskeletal:     ?   General: No swelling.  ?   Cervical back: Neck supple.  ?Skin: ?   General: Skin is warm and dry.  ?   Capillary Refill: Capillary refill takes less than 2 seconds.  ?Neurological:  ?   Mental Status: She is alert.  ? ? ?Procedures  ?Marland KitchenCritical Care ?Performed by: Teressa Lower, MD ?Authorized by: Teressa Lower, MD  ? ?Critical care provider statement:  ?  Critical care time (minutes):  30 ?  Critical care was necessary to treat or prevent imminent or life-threatening deterioration of the following conditions: Head bleed with herniation, osmotic agent. ?  Critical care was time spent personally by me on the following activities:  Development of treatment plan with patient or surrogate, discussions with consultants, evaluation of patient's response to treatment, examination of patient, ordering and review of laboratory studies, ordering and review of radiographic studies, ordering and performing treatments and interventions, pulse oximetry, re-evaluation of patient's condition and review of old charts ? ?ED Course / MDM  ? ?Clinical Course as of 08/27/21 1749  ?Sat Aug 27, 2021  ?[place] [HS]  ?  ?Clinical Course User Index ?[HS] Sherrill Raring, PA-C  ? ?Medical Decision Making ?Amount and/or Complexity of Data Reviewed ?Labs: ordered. ?Radiology: ordered. ? ?Risk ?Prescription drug  management. ? ? ?Patient received in handoff.  Persistent seizure activity and headache for 2 weeks.  CT imaging showing intracranial hemorrhage secondary to subarachnoid and subdural hematoma.  Given hypotension and bradycardia, 3% normal saline administered, head of bed raised, clevidipine initiated with target blood pressure 130-150.  Neurology and neurosurgery consulted.  Recommends transfer to [facility].  Patient transferred to the OR at [facility]. ? ? ? ? ?  ?Teressa Lower, MD ?08/27/21 1751 ? ?

## 2021-08-27 NOTE — Progress Notes (Signed)
Patient ID: Kayla Maynard, female   DOB: 1956-03-02, 66 y.o.   MRN: [id number] ?INR. ?66 year old right-handed lady who was found to be unresponsive this morning.  She subsequently proceeded to have at least 3 episodes jerking with subsequent improvement of her level consciousness.  She underwent a CT of the brain which demonstrated diffuse subarachnoid hemorrhage primarily in the left perisylvian and the left perimesencephalic subarachnoid space associated with a prominent acute left cerebral convexity subdural hematoma associated with mass effect and effacement of the underlying sulci.  CT angiogram of the head and neck confirmed the presence of an approximately 7.3 mm x 3.7 mm by approximately 5 mm irregular left posterior communicating artery region aneurysm.Marland Kitchen ?After stabilization, the patient presented to the angio suite for diagnostic cath arteriogram with planned endovascular obliteration of the left P-comm artery aneurysm. ? ?Informed consent was obtained from the patient's son and sister.  The procedure, the reasons, and potential for complications was discussed in detail with both of them.  Risk for rebleeding during treatment with potential for worsening neurological status and fatality was discussed fully.  Questions were answered to their satisfaction.  They expressed understanding of the patient's medical condition. ? ?Informed consent for the treatment was obtained from the son. ? ?Fatima Sanger MD. ?

## 2021-08-27 NOTE — Progress Notes (Signed)
Patient ID: Kayla Maynard, female   DOB: 12-May-1955, 66 y.o.   MRN: [id number] ?CT head and CT angio head and neck show probable aneurysm in the region of the P-comm or possibly paraclinoid carotid.  Subdural is stable patient's exam she was started to come out of her sedatives and being intubated she was briskly localizing and trying to remove the tube in the left arm she has a runny Paracets pupils were equal and reactive.  Due to her stable clinical exam stable size of subdural, I think it is most important to protect the aneurysm prior to performing a craniotomy for evacuation of the subdural.  So I extensively talked about this with the patient's family and we have recommended proceeding forward with interventional radiology to perform angiography and possible embolization of this aneurysm prior to [place] performing craniotomy for evacuation of the subdural.  I explained to them the risk of rehemorrhage in addition patient has significant cardiac history has had stents has been taking aspirin and has history of unstable angina.  So she is on be high risk for any procedure.  But we feel this is in her best interest to doing the aneurysm first and then the subdural.  I will await the results of angiography and hopefully successful embolization from Dr. Tollie Eth and then we will plan on proceeding forward with craniotomy. ?

## 2021-08-27 NOTE — ED Notes (Signed)
[facility] leaving with patient at this time. [facility] nurse discontinued CLEVIPREX due to parameters. ?

## 2021-08-27 NOTE — ED Notes (Signed)
Pt taken directly to CT

## 2021-08-27 NOTE — Progress Notes (Signed)
Patient was successfully treated and angiography with embolized left posterior communicating artery aneurysm.  Patient is now being transferred up into the operating room for a left-sided craniotomy for subdural hematoma.  I have extensively gone over the risks and benefits with the patient's family as well as perioperative course expectations of outcome and alternatives of surgery and they understand and agree to proceed forward. ?

## 2021-08-27 NOTE — Procedures (Signed)
Central Venous Catheter Insertion Procedure Note ? ?Kayla Maynard  ?[id number]  ?Jul 12, 1955 ? ?Date:08/27/21  ?Time:7:08 PM  ? ?Provider Performing:Bryn Perkin  ? ?Procedure: Insertion of Non-tunneled Central Venous Catheter(36556) with US guidance (55732)  ? ?Indication(s) ?Medication administration and Difficult access ? ?Consent ?Unable to obtain consent due to emergent nature of procedure. ? ?Anesthesia ?Topical only with 1% lidocaine  ? ?Timeout ?Verified patient identification, verified procedure, site/side was marked, verified correct patient position, special equipment/implants available, medications/allergies/relevant history reviewed, required imaging and test results available. ? ?Sterile Technique ?Maximal sterile technique including full sterile barrier drape, hand hygiene, sterile gown, sterile gloves, mask, hair covering, sterile ultrasound probe cover (if used). ? ?Procedure Description ?Area of catheter insertion was cleaned with chlorhexidine and draped in sterile fashion.  With real-time ultrasound guidance a 98F 20cm triple lumen central venous catheter was placed into the left subclavian vein. Nonpulsatile blood flow and easy flushing noted in all ports.  The catheter was sutured in place and sterile dressing applied. ? ?Complications/Tolerance ?None; patient tolerated the procedure well. ?Chest X-ray is ordered to verify placement for internal jugular or subclavian cannulation.   Chest x-ray is not ordered for femoral cannulation. ? ?EBL ?Minimal ? ?Lynnell Catalan, MD FRCPC ?ICU Physician ?[facility]  ?Pager: [phone] ?Or Epic Secure Chat ?After hours: [phone]. ? ?08/27/2021, 7:09 PM ? ? ? ? ?

## 2021-08-27 NOTE — Op Note (Signed)
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 ?Postoperative diagnosis: Same ? ?Procedure: Left-sided pterional craniotomy for evacuation of left-sided subdural hematoma with implantation of the bone flap in the left abdominal wall ? ?Surgeon: Dominica Severin Maxamillion Banas ? ?Assistant: Nash Shearer ? ?Anesthesia: General ? ?EBL: Minimal ? ?HPI: 66 year old female presented with altered mental status and 2 weeks of headache and work-up revealed subarachnoid hemorrhage and a large left subdural hematoma.  Follow-up CT angiogram showed probable posterior communicating artery aneurysm.  Patient was recommended angiography and endovascular treatment of the aneurysm with subsequent craniotomy for evacuation of subdural.  I extensively over the risks and benefits of both procedures with the patient's family as well as perioperative course expectations of outcome and alternatives to surgery and they understood and agreed to proceed forward. ? ?Operative procedure: After endovascular treatment patient was brought up in the oral OR was positioned for a left-sided pterional craniotomy shoulder bump under her left shoulder head turned the right her head was shaved and prepped and draped in routine sterile fashion.  Also prepping out the left side abdominal wall.  Inverted?  Incision was drawn and the temporalis was divided craniotomy flap was turned the dura was then opened up and flapped over and a large acute hematoma was immediately identified came out under pressure.  Both myself and my assistant irrigated the 3 and 6 degree orientation and ensured all the hematoma had been removed from what was visible along the hemisphere the brain was swollen but was not herniating through the dural defect.  We did elect to implant the craniotomy flap in the abdominal wall so my assistant proceeded to open up the abdominal wall incision created pocket and implant the bone flap in the pocket  then that incision was closed with interrupted Vicryl's and a running 4 subcuticular.  The dura was reapproximated over a 7 mm JP drain and the temporalis was then closed Gelfoam was overlaid on top of the dura and underneath the temporalis and the scalp was closed with interrupted Vicryl and staples.  Wound was dressed patient went to the ICU in stable condition.  At the end the case all needle count sponge counts were correct. ?

## 2021-08-27 NOTE — ED Notes (Addendum)
Patient vomiting at this time. PA made aware. See new orders.  ?

## 2021-08-27 NOTE — Anesthesia Procedure Notes (Signed)
Arterial Line Insertion ?Start/End4/29/2023 7:17 PM, 08/27/2021 7:20 PM ?Performed by: Effie Berkshire, MD, anesthesiologist ? Patient location: Pre-op. ?Preanesthetic checklist: patient identified, IV checked, site marked, risks and benefits discussed, surgical consent, monitors and equipment checked, pre-op evaluation, timeout performed and anesthesia consent ?Lidocaine 1% used for infiltration ?Left, radial was placed ?Catheter size: 20 G ?Hand hygiene performed  and maximum sterile barriers used  ? ?Attempts: 3 ?Procedure performed without using ultrasound guided technique. ?Following insertion, dressing applied and Biopatch. ?Post procedure assessment: normal and unchanged ? ?Post procedure complications: second provider assisted. ?Patient tolerated the procedure well with no immediate complications. ?Additional procedure comments: MDA x1. ? ? ? ?

## 2021-08-27 NOTE — ED Triage Notes (Signed)
Pt arrives via [employer] from [facility]  ?

## 2021-08-27 NOTE — Progress Notes (Signed)
Patient came intubated from OSH with a 7.5 ETT taped at 21 cm at the lip, placed on above vent settings, patient tolerated well. ?

## 2021-08-27 NOTE — ED Triage Notes (Addendum)
Patient to ED via EMS after family reports patient unresponsive. States that they started CPR. Fire on scene and obtained CBG "zero." Reports that patient had pulse. D10 given. Patient alert and oriented, complaining of nausea at this time. CBG 212. Reports that she has headache from neck pain that started several weeks ago. PA in room at time of triage for MSE. NIH performed by PA. ?

## 2021-08-27 NOTE — ED Provider Notes (Signed)
?[facility] ?Provider Note ? ? ?CSN: [id number] ?Arrival date & time: 08/27/21  1401 ? ?  ? ?History ? ?Chief Complaint  ?Patient presents with  ? Loss of Consciousness  ? ? ?Kayla Maynard is a 66 y.o. female. ? ? ?Hypoglycemia ? ?Patient with medical history notable for unstable angina presents today due to loss of consciousness.  Patient woke up this morning and was working on a rocking chair, she started feeling dizzy described as lightheaded.  Felt nauseated had an episode of emesis had to lie down.  She called her sister-in-law who arrived and found her unresponsive.  She called EMS who advised her to do CPR as the patient was not waking up and looked blue.  During that time patient had upper extremity twitching, she pulled her hands into fists and was shaking.  Family rolled the patient to her side, when EMS arrived she was awake.  They gave her 250 of D10 which improved her symptoms, they were unable to check her glucometer and said the machine read 0.  Patient does not have a history of diabetes.   ? ?Patient remembers what occurred, she denies any history of seizures or syncopal episodes.  Denies any recent changes in medication.  She currently feels nauseated, she tells me she has been having neck pain and a headache for the last 2 weeks which have been worsening.  Denies any fevers or vision changes.  She specifically says she is not having any chest pain, she does endorse intermittent shortness of breath. ? ?Patient was feeling nauseated complaining about head pain and had a third seizure of the day first witnessed here in the emergency department. ? ?Home Medications ?Prior to Admission medications   ?Medication Sig Start Date End Date Taking? Authorizing Provider  ?Magnesium 200 MG TABS Take 1 tablet by mouth daily.   Yes [provider]  ?nitroGLYCERIN (NITROSTAT) 0.4 MG SL tablet Place 0.4 mg under the tongue every 5 (five) minutes as needed for chest pain.   Yes  [provider]  ?OVER THE COUNTER MEDICATION Take 1 tablet by mouth daily. Super Beets   Yes [provider]  ?zinc gluconate 50 MG tablet Take 50 mg by mouth daily.   Yes [provider]  ?   ? ?Allergies    ?Morphine and Penicillins   ? ?Review of Systems   ?Review of Systems ? ?Physical Exam ?Updated Vital Signs ?BP 133/89   Pulse 94   Temp (!) 92.5 ?F (33.6 ?C)   Resp 20   Ht 5\' 6"  (1.676 m)   Wt 81.6 kg   SpO2 100%   BMI 29.05 kg/m?  ?Physical Exam ?Constitutional:   ?   Appearance: She is obese. She is ill-appearing.  ?HENT:  ?   Head: Normocephalic.  ?   Mouth/Throat:  ?   Mouth: Mucous membranes are dry.  ?Eyes:  ?   Extraocular Movements: Extraocular movements intact.  ?   Pupils: Pupils are equal, round, and reactive to light.  ?   Comments: Constricted pupils, roughly symmetric bilaterally  ?Cardiovascular:  ?   Rate and Rhythm: Bradycardia present. Rhythm irregular.  ?Pulmonary:  ?   Effort: Pulmonary effort is normal.  ?   Breath sounds: Normal breath sounds.  ?Abdominal:  ?   General: Abdomen is flat.  ?   Tenderness: There is no abdominal tenderness.  ?Musculoskeletal:  ?   Cervical back: Normal range of motion. Tenderness present.  ?Skin: ?  Coloration: Skin is pale.  ?Neurological:  ?   Mental Status: She is alert.  ?   Comments: Patient initially follow commands, grip strength is good bilaterally and cranial nerves III through XII are grossly intact without any facial droop.  No dysarthria.  Able to raise upper and lower extremities.  ? ? ?ED Results / Procedures / Treatments   ?Labs ?(all labs ordered are listed, but only abnormal results are displayed) ?Labs Reviewed  ?COMPREHENSIVE METABOLIC PANEL - Abnormal; Notable for the following components:  ?    Result Value  ? Glucose, Bld 246 (*)   ? Calcium 8.7 (*)   ? All other components within normal limits  ?CBC WITH DIFFERENTIAL/PLATELET - Abnormal; Notable for the following components:  ? WBC 11.2 (*)   ?  Hemoglobin 15.7 (*)   ? HCT 46.7 (*)   ? Neutro Abs 9.3 (*)   ? Abs Immature Granulocytes 0.11 (*)   ? All other components within normal limits  ?URINALYSIS, ROUTINE W REFLEX MICROSCOPIC - Abnormal; Notable for the following components:  ? Glucose, UA 150 (*)   ? Hgb urine dipstick SMALL (*)   ? Ketones, ur 20 (*)   ? Protein, ur 30 (*)   ? All other components within normal limits  ?CBG MONITORING, ED - Abnormal; Notable for the following components:  ? Glucose-Capillary 214 (*)   ? All other components within normal limits  ?CBG MONITORING, ED - Abnormal; Notable for the following components:  ? Glucose-Capillary 193 (*)   ? All other components within normal limits  ?RAPID URINE DRUG SCREEN, HOSP PERFORMED  ?TROPONIN I (HIGH SENSITIVITY)  ?TROPONIN I (HIGH SENSITIVITY)  ? ? ?EKG ?None ? ?Radiology ?CT Head Wo Contrast ? ?Result Date: 08/27/2021 ?CLINICAL DATA:  Sudden severe headache EXAM: CT HEAD WITHOUT CONTRAST CT CERVICAL SPINE WITHOUT CONTRAST TECHNIQUE: Multidetector CT imaging of the head and cervical spine was performed following the standard protocol without intravenous contrast. Multiplanar CT image reconstructions of the cervical spine were also generated. RADIATION DOSE REDUCTION: This exam was performed according to the departmental dose-optimization program which includes automated exposure control, adjustment of the mA and/or kV according to patient size and/or use of iterative reconstruction technique. COMPARISON:  None FINDINGS: CT HEAD FINDINGS Brain: Abnormal exam. Extensive subarachnoid hemorrhage at basilar cisterns, interhemispheric fissure, BILATERAL sylvian fissures greater on LEFT. Additional large acute subdural hematoma at LEFT frontal, parietal and temporal regions measuring up to 14 mm thick at the frontal parietal region. Large collection of extra-axial high attenuation blood identified inferior to the LEFT temporal lobe 20 mm thick. Marked mass effect upon the LEFT hemisphere with  13 mm of the LEFT to RIGHT mediastinal shift. Marked compression of both lateral ventricles. No intraparenchymal hemorrhage or intraventricular hemorrhage identified. No evidence of acute infarction. Vascular: No definite abnormalities identified Skull: Intact Sinuses/Orbits: Clear Other: N/A CT CERVICAL SPINE FINDINGS Alignment: Normal Skull base and vertebrae: Osseous mineralization normal. Skull base intact. Vertebral body heights maintained. Slight disc space narrowing C5-C6. No fracture, subluxation, or bone destruction. Minor scattered facet degenerative changes. Soft tissues and spinal canal: Prevertebral soft tissues normal thickness. Endotracheal and nasogastric tubes traverse cervical region. Disc levels:  Unremarkable Upper chest: Lung apices clear Other: N/A IMPRESSION: Extensive subarachnoid hemorrhage at basilar cisterns, interhemispheric fissure, BILATERAL Sylvian fissures greater on LEFT. Additional large subdural hematoma at LEFT frontal, parietal and temporal regions measuring up to 14 mm thick LEFT frontoparietal and and 20 mm thick inferior to LEFT temporal  lobe. Findings are concerning for ruptured intracranial aneurysm. Marked mass effect upon the LEFT hemisphere with 13 mm of LEFT to RIGHT mediastinal shift and marked compression of both lateral ventricles. No acute cervical spine abnormalities. Critical Value/emergent results were called by telephone at the time of interpretation on 08/27/2021 at 3:41 pm to provider Dr. Posey Rea, who verbally acknowledged these results. Electronically Signed   By: Ulyses Southward M.D.   On: 08/27/2021 15:57  ? ?CT Cervical Spine Wo Contrast ? ?Result Date: 08/27/2021 ?CLINICAL DATA:  Sudden severe headache EXAM: CT HEAD WITHOUT CONTRAST CT CERVICAL SPINE WITHOUT CONTRAST TECHNIQUE: Multidetector CT imaging of the head and cervical spine was performed following the standard protocol without intravenous contrast. Multiplanar CT image reconstructions of the cervical  spine were also generated. RADIATION DOSE REDUCTION: This exam was performed according to the departmental dose-optimization program which includes automated exposure control, adjustment of the mA and/or kV according to pa

## 2021-08-27 NOTE — Transfer of Care (Signed)
Immediate Anesthesia Transfer of Care Note ? ?Patient: Kayla Maynard ? ?Procedure(s) Performed: RADIOLOGY WITH ANESTHESIA ? ?Patient Location: PACU and ICU ? ?Anesthesia Type:General ? ?Level of Consciousness: sedated and Patient remains intubated per anesthesia plan ? ?Airway & Oxygen Therapy: Patient remains intubated per anesthesia plan and Patient placed on Ventilator (see vital sign flow sheet for setting) ? ?Post-op Assessment: Report given to RN and Post -op Vital signs reviewed and stable ? ?Post vital signs: Reviewed and stable ? ?Last Vitals:  ?Vitals Value Taken Time  ?BP 137/77 (96)   ?Temp    ?Pulse 77   ?Resp    ?SpO2 100   ? ? ?Last Pain:  ?Vitals:  ? 08/27/21 1553  ?TempSrc: Rectal  ?PainSc:   ?   ? ?  ? ?Complications: No notable events documented. ?

## 2021-08-27 NOTE — ED Notes (Signed)
Intubated by Dr. Estell Harpin at this time. 7.5 ET tube, 20 at lip. + breath sounds and C02 color change.  ?

## 2021-08-27 NOTE — ED Notes (Signed)
PA made aware of BP. PA stated if BP drops lower than 129 to go down on CLEVIPREX. ?

## 2021-08-27 NOTE — Anesthesia Preprocedure Evaluation (Deleted)
Anesthesia Evaluation  ? ? ?Reviewed: ?Allergy & Precautions, Patient's Chart, lab work & pertinent test results, Unable to perform ROS - Chart review onlyPreop documentation limited or incomplete due to emergent nature of procedure. ? ?Airway ? ? ? ? ? ? ? Dental ?  ?Pulmonary ?Current Smoker,  ?  ? ? ? ? ? ? ? Cardiovascular ?+ angina at rest  ? ? ?  ?Neuro/Psych ?  ? GI/Hepatic ?negative GI ROS, Neg liver ROS,   ?Endo/Other  ?negative endocrine ROS ? Renal/GU ?negative Renal ROS  ? ?  ?Musculoskeletal ? ? Abdominal ?  ?Peds ? Hematology ?negative hematology ROS ?(+)   ?Anesthesia Other Findings ? ? Reproductive/Obstetrics ? ?  ? ? ? ? ? ? ? ? ? ? ? ? ? ?  ?  ? ? ? ? ? ? ? ? ?Anesthesia Physical ?Anesthesia Plan ? ?ASA: 4 and emergent ? ?Anesthesia Plan: General  ? ?Post-op Pain Management:   ? ?Induction: Inhalational ? ?PONV Risk Score and Plan: 2 and Ondansetron and Dexamethasone ? ?Airway Management Planned: Oral ETT ? ?Additional Equipment: Arterial line ? ?Intra-op Plan:  ? ?Post-operative Plan: Post-operative intubation/ventilation ? ?Informed Consent:  ? ? ? ?Only emergency history available and History available from chart only ? ?Plan Discussed with: CRNA ? ?Anesthesia Plan Comments:   ? ? ? ? ? ? ?Anesthesia Quick Evaluation ? ?

## 2021-08-27 NOTE — Consult Note (Signed)
? ?NAME:  Kayla Maynard, MRN:  [id number], DOB:  Feb 21, 1956, LOS: 0 ?ADMISSION DATE:  08/27/2021, CONSULTATION DATE:  08/27/21 ?REFERRING MD:  Saintclair Halsted - NSGY, CHIEF COMPLAINT:  SAH, endotracheally intubated  ? ?History of Present Illness:  ?66 yo F PMH angina presented to [facility] 4/29 after loss of consciousness at home. Family actually started CPR per 911 advice though unclear if pulses were actually lost -- sounds like there was hypertonicity and myoclonus, more concerning for seizure. On EMS arrival pt awake, hypoglycemic, got d10, was awake ? ?In ED denied hx sz. Endorsed HA and neck pain, worse x 2 wk. Had 2 additional sz in ED. Intubated for airway protection. Found to have ICH -- SAH, SDH. Became bradycardic, Started on 3% and cleviprex  ? ?[facility] for [facility] care ? ?Pertinent  Medical History  ?Angina  ? ?Significant Hospital Events: ?Including procedures, antibiotic start and stop dates in addition to other pertinent events   ?Multiple seizures. Hypoglycemia. Intubated. Found to have SAH, SDH. Cleviprex, hypertonic  ? ?Interim History / Subjective:  ?On 50 prop, cleviprex  ? ?Moving L side  ? ?Objective   ?Blood pressure 133/89, pulse 94, temperature (!) 92.5 ?F (33.6 ?C), resp. rate 20, height 5\' 6"  (1.676 m), weight 81.6 kg, SpO2 100 %. ?   ?Vent Mode: PRVC ?FiO2 (%):  [50 %] 50 % ?Set Rate:  [20 bmp] 20 bmp ?Vt Set:  [500 mL] 500 mL ?PEEP:  [5 cmH20] 5 cmH20 ?Plateau Pressure:  [17 cmH20] 17 cmH20  ? ?Intake/Output Summary (Last 24 hours) at 08/27/2021 1807 ?Last data filed at 08/27/2021 1710 ?Gross per 24 hour  ?Intake 458.91 ml  ?Output --  ?Net 458.91 ml  ? ?Filed Weights  ? 08/27/21 1411  ?Weight: 81.6 kg  ? ? ?Examination: ?General: Critically ill adult F intubated sedated  ?HENT: NCAT pink mm ETT secure  ?Lungs: CTAb mechanically ventilated  ?Cardiovascular: rrr s1s2 cap refill < 3 ?Abdomen: soft ndnt + bowel sounds  ?Extremities: no acute jo ?Neuro: Sedated 50 prop. Pupils are 82mm.  Slightly disconjugate gaze. Not following commands. Moving LUE purposefully. BLE flexion to pain ?GU: foley, clear urine  ? ?Resolved Hospital Problem list   ? ? ?Assessment & Plan:  ? 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 ?Acute respiratory failure requiring intubation ?P ?-cont MV support  ?-VAP, PAD, pulm hygiene ? ?Hypoglycemia -- improved with d10 correction, now hyperglycemic  ?P ?-trend closely, goal normoglycemia  ? ?Hx unstable angina ?-takes ASA at home  ? ?Best Practice (right click and "Reselect all SmartList Selections" daily)  ? ?Diet/type: NPO ?DVT prophylaxis: SCD ?GI prophylaxis: PPI ?Lines: N/A ?Foley:  Yes, and it is still needed ?Code Status:  full code ?Last date of multidisciplinary goals of care discussion [-- ] ? ?Labs   ?CBC: ?Recent Labs  ?Lab 08/27/21 ?1443  ?WBC 11.2*  ?NEUTROABS 9.3*  ?HGB 15.7*  ?HCT 46.7*  ?MCV 95.7  ?PLT 233  ? ? ?Basic Metabolic Panel: ?Recent Labs  ?Lab 08/27/21 ?1443  ?NA 137  ?K 3.7  ?CL 104  ?CO2 22  ?GLUCOSE 246*  ?BUN 15  ?CREATININE 0.77  ?CALCIUM 8.7*  ? ?  GFR: ?Estimated Creatinine Clearance: 77.5 mL/min (by C-G formula based on SCr of 0.77 mg/dL). ?Recent Labs  ?Lab 08/27/21 ?1443  ?WBC 11.2*  ? ? ?Liver Function Tests: ?Recent Labs  ?Lab 08/27/21 ?1443  ?AST 29  ?ALT 23  ?ALKPHOS 79  ?BILITOT 0.7  ?PROT 6.9  ?ALBUMIN 4.0  ? ?No results for input(s): LIPASE, AMYLASE in the last 168 hours. ?No results for input(s): AMMONIA in the last 168 hours. ? ?ABG ?No results found for: PHART, PCO2ART, PO2ART, HCO3, TCO2, ACIDBASEDEF, O2SAT  ? ?Coagulation Profile: ?No results for input(s): INR, PROTIME in the last 168 hours. ? ?Cardiac Enzymes: ?No results for input(s): CKTOTAL, CKMB, CKMBINDEX,  TROPONINI in the last 168 hours. ? ?HbA1C: ?No results found for: HGBA1C ? ?CBG: ?Recent Labs  ?Lab 08/27/21 ?1406 08/27/21 ?1444  ?GLUCAP 214* 193*  ? ? ?Review of Systems:   ?Unable to obtain intubated sedated  ?Past Medical History:  ?She,  has a past medical history of Angina at rest [facility]).  ? ?Surgical History:  ? ?Past Surgical History:  ?Procedure Laterality Date  ? STENT PLACEMENT VASCULAR ([facility] HX)    ?  ? ?Social History:  ? reports that she has been smoking cigarettes. She has been smoking an average of 1 pack per day. She has never used smokeless tobacco. She reports current alcohol use. She reports that she does not use drugs.  ? ?Family History:  ?Her family history is not on file.  ? ?Allergies ?Allergies  ?Allergen Reactions  ? Morphine   ? Penicillins Hives  ?  ? ?Home Medications  ?Prior to Admission medications   ?Medication Sig Start Date End Date Taking? Authorizing Provider  ?Magnesium 200 MG TABS Take 1 tablet by mouth daily.   Yes [provider]  ?nitroGLYCERIN (NITROSTAT) 0.4 MG SL tablet Place 0.4 mg under the tongue every 5 (five) minutes as needed for chest pain.   Yes [provider]  ?OVER THE COUNTER MEDICATION Take 1 tablet by mouth daily. Super Beets   Yes [provider]  ?zinc gluconate 50 MG tablet Take 50 mg by mouth daily.   Yes [provider]  ?  ? ?Critical care time: 40 min ?  ? ? ?CRITICAL CARE ?Performed by: Cristal Generous ? ? ?Total critical care time: 40 minutes ? ?Critical care time was exclusive of separately billable procedures and treating other patients. ?Critical care was necessary to treat or prevent imminent or life-threatening deterioration. ? ?Critical care was time spent personally by me on the following activities: development of treatment plan with patient and/or surrogate as well as nursing, discussions with consultants, evaluation of patient's response to treatment, examination of patient, obtaining history from patient  or surrogate, ordering and performing treatments and interventions, ordering and review of laboratory studies, ordering and review of radiographic studies, pulse oximetry and re-evaluation of patient's condition. ? ?Eliseo Gum MSN, AGACNP-BC ?[facility] ?Amion for pager  ?08/27/2021, 6:51 PM ? ? ? ?

## 2021-08-27 NOTE — ED Notes (Addendum)
Tessie Fass, NP at bedside ? ?

## 2021-08-27 NOTE — Progress Notes (Deleted)
Patient intubated for emergency surgery ?

## 2021-08-27 NOTE — ED Notes (Signed)
Pt to IR

## 2021-08-27 NOTE — Assessment & Plan Note (Signed)
Due to Pcomm aneurysm ? ?- For operative evacuation. ?

## 2021-08-28 ENCOUNTER — Inpatient Hospital Stay (HOSPITAL_COMMUNITY): Payer: Medicare Other

## 2021-08-28 DIAGNOSIS — R1310 Dysphagia, unspecified: Secondary | ICD-10-CM | POA: Diagnosis not present

## 2021-08-28 DIAGNOSIS — I608 Other nontraumatic subarachnoid hemorrhage: Secondary | ICD-10-CM

## 2021-08-28 DIAGNOSIS — I609 Nontraumatic subarachnoid hemorrhage, unspecified: Principal | ICD-10-CM

## 2021-08-28 DIAGNOSIS — R0602 Shortness of breath: Secondary | ICD-10-CM

## 2021-08-28 DIAGNOSIS — R001 Bradycardia, unspecified: Secondary | ICD-10-CM

## 2021-08-28 LAB — COMPREHENSIVE METABOLIC PANEL
ALT: 19 U/L (ref 0–44)
AST: 26 U/L (ref 15–41)
Albumin: 2.8 g/dL — ABNORMAL LOW (ref 3.5–5.0)
Alkaline Phosphatase: 54 U/L (ref 38–126)
Anion gap: 4 — ABNORMAL LOW (ref 5–15)
BUN: 6 mg/dL — ABNORMAL LOW (ref 8–23)
CO2: 22 mmol/L (ref 22–32)
Calcium: 7.6 mg/dL — ABNORMAL LOW (ref 8.9–10.3)
Chloride: 116 mmol/L — ABNORMAL HIGH (ref 98–111)
Creatinine, Ser: 0.7 mg/dL (ref 0.44–1.00)
GFR, Estimated: >60 mL/min (ref >60)
Glucose, Bld: 119 mg/dL — ABNORMAL HIGH (ref 70–99)
Potassium: 3.7 mmol/L (ref 3.5–5.1)
Sodium: 142 mmol/L (ref 135–145)
Total Bilirubin: 0.2 mg/dL — ABNORMAL LOW (ref 0.3–1.2)
Total Protein: 5.4 g/dL — ABNORMAL LOW (ref 6.5–8.1)

## 2021-08-28 LAB — POCT I-STAT 7, (LYTES, BLD GAS, ICA,H+H)
Acid-base deficit: 4 mmol/L — ABNORMAL HIGH (ref 0.0–2.0)
Acid-base deficit: 4 mmol/L — ABNORMAL HIGH (ref 0.0–2.0)
Bicarbonate: 20.4 mmol/L (ref 20.0–28.0)
Bicarbonate: 21.2 mmol/L (ref 20.0–28.0)
Calcium, Ion: 1.04 mmol/L — ABNORMAL LOW (ref 1.15–1.40)
Calcium, Ion: 1.12 mmol/L — ABNORMAL LOW (ref 1.15–1.40)
HCT: 37 % (ref 36.0–46.0)
HCT: 38 % (ref 36.0–46.0)
Hemoglobin: 12.6 g/dL (ref 12.0–15.0)
Hemoglobin: 12.9 g/dL (ref 12.0–15.0)
O2 Saturation: 98 %
O2 Saturation: 99 %
Patient temperature: 34.3
Patient temperature: 99.5
Potassium: 3.8 mmol/L (ref 3.5–5.1)
Potassium: 3.9 mmol/L (ref 3.5–5.1)
Sodium: 144 mmol/L (ref 135–145)
Sodium: 146 mmol/L — ABNORMAL HIGH (ref 135–145)
TCO2: 21 mmol/L — ABNORMAL LOW (ref 22–32)
TCO2: 22 mmol/L (ref 22–32)
pCO2 arterial: 29.9 mmHg — ABNORMAL LOW (ref 32–48)
pCO2 arterial: 38.8 mmHg (ref 32–48)
pH, Arterial: 7.347 — ABNORMAL LOW (ref 7.35–7.45)
pH, Arterial: 7.429 (ref 7.35–7.45)
pO2, Arterial: 132 mmHg — ABNORMAL HIGH (ref 83–108)
pO2, Arterial: 93 mmHg (ref 83–108)

## 2021-08-28 LAB — ECHOCARDIOGRAM COMPLETE
AR max vel: 2.53 cm2
AV Area VTI: 2.75 cm2
AV Area mean vel: 2.43 cm2
AV Mean grad: 5 mmHg
AV Peak grad: 10.2 mmHg
Ao pk vel: 1.6 m/s
Area-P 1/2: 3.85 cm2
Calc EF: 84.7 %
Height: 66 in
S' Lateral: 2.1 cm
Single Plane A2C EF: 89.3 %
Single Plane A4C EF: 79.1 %
Weight: 2899.49 oz

## 2021-08-28 LAB — CBC WITH DIFFERENTIAL/PLATELET
Abs Immature Granulocytes: 0.16 10*3/uL — ABNORMAL HIGH (ref 0.00–0.07)
Basophils Absolute: 0 10*3/uL (ref 0.0–0.1)
Basophils Relative: 0 %
Eosinophils Absolute: 0 10*3/uL (ref 0.0–0.5)
Eosinophils Relative: 0 %
HCT: 38 % (ref 36.0–46.0)
Hemoglobin: 12.8 g/dL (ref 12.0–15.0)
Immature Granulocytes: 1 %
Lymphocytes Relative: 10 %
Lymphs Abs: 2 10*3/uL (ref 0.7–4.0)
MCH: 32.5 pg (ref 26.0–34.0)
MCHC: 33.7 g/dL (ref 30.0–36.0)
MCV: 96.4 fL (ref 80.0–100.0)
Monocytes Absolute: 1.3 10*3/uL — ABNORMAL HIGH (ref 0.1–1.0)
Monocytes Relative: 6 %
Neutro Abs: 17.4 10*3/uL — ABNORMAL HIGH (ref 1.7–7.7)
Neutrophils Relative %: 83 %
Platelets: 264 10*3/uL (ref 150–400)
RBC: 3.94 MIL/uL (ref 3.87–5.11)
RDW: 12.6 % (ref 11.5–15.5)
WBC: 20.9 10*3/uL — ABNORMAL HIGH (ref 4.0–10.5)
nRBC: 0 % (ref 0.0–0.2)

## 2021-08-28 LAB — BASIC METABOLIC PANEL
Anion gap: 7 (ref 5–15)
BUN: 6 mg/dL — ABNORMAL LOW (ref 8–23)
CO2: 22 mmol/L (ref 22–32)
Calcium: 7.8 mg/dL — ABNORMAL LOW (ref 8.9–10.3)
Chloride: 115 mmol/L — ABNORMAL HIGH (ref 98–111)
Creatinine, Ser: 0.69 mg/dL (ref 0.44–1.00)
GFR, Estimated: >60 mL/min (ref >60)
Glucose, Bld: 122 mg/dL — ABNORMAL HIGH (ref 70–99)
Potassium: 3.7 mmol/L (ref 3.5–5.1)
Sodium: 144 mmol/L (ref 135–145)

## 2021-08-28 LAB — CBC
HCT: 39.6 % (ref 36.0–46.0)
Hemoglobin: 13.2 g/dL (ref 12.0–15.0)
MCH: 32.3 pg (ref 26.0–34.0)
MCHC: 33.3 g/dL (ref 30.0–36.0)
MCV: 96.8 fL (ref 80.0–100.0)
Platelets: 261 K/uL (ref 150–400)
RBC: 4.09 MIL/uL (ref 3.87–5.11)
RDW: 12.7 % (ref 11.5–15.5)
WBC: 20.7 K/uL — ABNORMAL HIGH (ref 4.0–10.5)
nRBC: 0 % (ref 0.0–0.2)

## 2021-08-28 LAB — ABO/RH: ABO/RH(D): B POS

## 2021-08-28 LAB — GLUCOSE, CAPILLARY
Glucose-Capillary: 141 mg/dL — ABNORMAL HIGH (ref 70–99)
Glucose-Capillary: 127 mg/dL — ABNORMAL HIGH (ref 70–99)
Glucose-Capillary: 140 mg/dL — ABNORMAL HIGH (ref 70–99)
Glucose-Capillary: 153 mg/dL — ABNORMAL HIGH (ref 70–99)
Glucose-Capillary: 123 mg/dL — ABNORMAL HIGH (ref 70–99)
Glucose-Capillary: 123 mg/dL — ABNORMAL HIGH (ref 70–99)

## 2021-08-28 LAB — SODIUM
Sodium: 145 mmol/L (ref 135–145)
Sodium: 143 mmol/L (ref 135–145)

## 2021-08-28 LAB — MRSA NEXT GEN BY PCR, NASAL: MRSA by PCR Next Gen: NOT DETECTED

## 2021-08-28 LAB — PHOSPHORUS
Phosphorus: 2.4 mg/dL — ABNORMAL LOW (ref 2.5–4.6)
Phosphorus: 2.6 mg/dL (ref 2.5–4.6)

## 2021-08-28 LAB — MAGNESIUM
Magnesium: 1.8 mg/dL (ref 1.7–2.4)
Magnesium: 1.9 mg/dL (ref 1.7–2.4)

## 2021-08-28 MED ORDER — SODIUM CHLORIDE 0.9 % IV SOLN: INTRAVENOUS | Status: DC

## 2021-08-28 MED ORDER — CHLORHEXIDINE GLUCONATE 0.12% ORAL RINSE (MEDLINE KIT)
15.0000 mL | Freq: Two times a day (BID) | OROMUCOSAL | Status: DC
  Administered 2021-08-28 – 2021-08-30 (×6): 15 mL via OROMUCOSAL

## 2021-08-28 MED ORDER — NIMODIPINE 30 MG PO CAPS: 30.0000 mg | ORAL_CAPSULE | ORAL | Status: DC

## 2021-08-28 MED ORDER — PANTOPRAZOLE SODIUM 40 MG IV SOLR
40.0000 mg | Freq: Every day | INTRAVENOUS | Status: DC
  Administered 2021-08-28 – 2021-08-29 (×3): 40 mg via INTRAVENOUS
  Filled 2021-08-28 (×3): qty 10

## 2021-08-28 MED ORDER — ONDANSETRON HCL 4 MG/2ML IJ SOLN
4.0000 mg | INTRAMUSCULAR | Status: DC | PRN
  Administered 2021-08-30 – 2021-09-03 (×3): 4 mg via INTRAVENOUS
  Filled 2021-08-28 (×3): qty 2

## 2021-08-28 MED ORDER — VANCOMYCIN HCL IN DEXTROSE 1-5 GM/200ML-% IV SOLN
1000.0000 mg | Freq: Once | INTRAVENOUS | Status: AC
  Administered 2021-08-28: 1000 mg via INTRAVENOUS
  Filled 2021-08-28: qty 200

## 2021-08-28 MED ORDER — SODIUM CHLORIDE 0.9 % IV SOLN
INTRAVENOUS | Status: DC | PRN
Start: 2021-08-28 — End: 2021-09-12

## 2021-08-28 MED ORDER — VITAL HIGH PROTEIN PO LIQD
1000.0000 mL | ORAL | Status: DC
  Administered 2021-08-28 – 2021-08-29 (×2): 1000 mL

## 2021-08-28 MED ORDER — CLEVIDIPINE BUTYRATE 0.5 MG/ML IV EMUL: 0.0000 mg/h | INTRAVENOUS | Status: AC

## 2021-08-28 MED ORDER — PHENYLEPHRINE HCL-NACL 20-0.9 MG/250ML-% IV SOLN
0.0000 ug/min | INTRAVENOUS | Status: DC
  Administered 2021-08-28: 110 ug/min via INTRAVENOUS
  Administered 2021-08-28: 170 ug/min via INTRAVENOUS
  Administered 2021-08-28: 160 ug/min via INTRAVENOUS
  Administered 2021-08-28: 190 ug/min via INTRAVENOUS
  Administered 2021-08-28: 200 ug/min via INTRAVENOUS
  Administered 2021-08-28: 70 ug/min via INTRAVENOUS
  Administered 2021-08-28: 140 ug/min via INTRAVENOUS
  Administered 2021-08-29: 110 ug/min via INTRAVENOUS
  Administered 2021-08-29: 80 ug/min via INTRAVENOUS
  Administered 2021-08-29: 160 ug/min via INTRAVENOUS
  Administered 2021-08-29: 55 ug/min via INTRAVENOUS
  Administered 2021-08-29: 160 ug/min via INTRAVENOUS
  Filled 2021-08-28 (×11): qty 250

## 2021-08-28 MED ORDER — HEPARIN SODIUM (PORCINE) 5000 UNIT/ML IJ SOLN
5000.0000 [IU] | Freq: Three times a day (TID) | INTRAMUSCULAR | Status: DC
  Administered 2021-08-29 – 2021-09-12 (×44): 5000 [IU] via SUBCUTANEOUS
  Filled 2021-08-28 (×42): qty 1

## 2021-08-28 MED ORDER — ALBUMIN HUMAN 5 % IV SOLN
12.5000 g | Freq: Once | INTRAVENOUS | Status: AC
  Administered 2021-08-28: 12.5 g via INTRAVENOUS
  Filled 2021-08-28: qty 250

## 2021-08-28 MED ORDER — LABETALOL HCL 5 MG/ML IV SOLN
10.0000 mg | INTRAVENOUS | Status: DC | PRN
  Administered 2021-08-29 – 2021-09-09 (×5): 20 mg via INTRAVENOUS
  Filled 2021-08-28 (×5): qty 4

## 2021-08-28 MED ORDER — ACETAMINOPHEN 325 MG PO TABS: 650.0000 mg | ORAL_TABLET | ORAL | Status: DC | PRN

## 2021-08-28 MED ORDER — LEVETIRACETAM IN NACL 500 MG/100ML IV SOLN: 500.0000 mg | Freq: Two times a day (BID) | INTRAVENOUS | Status: DC

## 2021-08-28 MED ORDER — NIMODIPINE 6 MG/ML PO SOLN
30.0000 mg | ORAL | Status: DC
  Administered 2021-08-28 – 2021-08-30 (×23): 30 mg
  Filled 2021-08-28 (×12): qty 10

## 2021-08-28 MED ORDER — HYDROMORPHONE HCL 1 MG/ML IJ SOLN
0.5000 mg | INTRAMUSCULAR | Status: DC | PRN
  Administered 2021-08-28: 0.5 mg via INTRAVENOUS
  Filled 2021-08-28: qty 1

## 2021-08-28 MED ORDER — ACETAMINOPHEN 160 MG/5ML PO SOLN
650.0000 mg | ORAL | Status: DC | PRN
  Administered 2021-08-28 – 2021-08-31 (×7): 650 mg
  Filled 2021-08-28 (×7): qty 20.3

## 2021-08-28 MED ORDER — ORAL CARE MOUTH RINSE
15.0000 mL | OROMUCOSAL | Status: DC
  Administered 2021-08-28 – 2021-08-30 (×24): 15 mL via OROMUCOSAL

## 2021-08-28 MED ORDER — ACETAMINOPHEN 650 MG RE SUPP: 650.0000 mg | RECTAL | Status: DC | PRN

## 2021-08-28 MED ORDER — NIMODIPINE 6 MG/ML PO SOLN
30.0000 mg | ORAL | Status: AC
  Administered 2021-08-28: 30 mg
  Filled 2021-08-28: qty 10

## 2021-08-28 MED ORDER — PROMETHAZINE HCL 25 MG PO TABS
12.5000 mg | ORAL_TABLET | ORAL | Status: DC | PRN
  Administered 2021-09-11 (×2): 25 mg via ORAL
  Filled 2021-08-28 (×3): qty 1

## 2021-08-28 MED ORDER — PHENYLEPHRINE HCL-NACL 20-0.9 MG/250ML-% IV SOLN
INTRAVENOUS | Status: AC
  Filled 2021-08-28: qty 250

## 2021-08-28 MED ORDER — DOCUSATE SODIUM 50 MG/5ML PO LIQD
100.0000 mg | Freq: Two times a day (BID) | ORAL | Status: DC
  Administered 2021-08-28 – 2021-08-29 (×3): 100 mg
  Filled 2021-08-28 (×3): qty 10

## 2021-08-28 MED ORDER — HYDROMORPHONE HCL 1 MG/ML IJ SOLN
0.2500 mg | INTRAMUSCULAR | Status: DC | PRN
Start: 2021-08-28 — End: 2021-09-03
  Administered 2021-08-28 – 2021-09-03 (×11): 0.25 mg via INTRAVENOUS
  Filled 2021-08-28 (×10): qty 1

## 2021-08-28 MED ORDER — CHLORHEXIDINE GLUCONATE CLOTH 2 % EX PADS
6.0000 | MEDICATED_PAD | Freq: Every day | CUTANEOUS | Status: DC
  Administered 2021-08-28 – 2021-09-11 (×18): 6 via TOPICAL

## 2021-08-28 MED ORDER — VANCOMYCIN HCL IN DEXTROSE 1-5 GM/200ML-% IV SOLN
1000.0000 mg | Freq: Two times a day (BID) | INTRAVENOUS | Status: DC
  Administered 2021-08-28 – 2021-08-29 (×2): 1000 mg via INTRAVENOUS
  Filled 2021-08-28 (×2): qty 200

## 2021-08-28 MED ORDER — PROSOURCE TF PO LIQD
45.0000 mL | Freq: Two times a day (BID) | ORAL | Status: DC
  Administered 2021-08-28 (×2): 45 mL
  Filled 2021-08-28 (×2): qty 45

## 2021-08-28 MED ORDER — DOCUSATE SODIUM 100 MG PO CAPS
100.0000 mg | ORAL_CAPSULE | Freq: Two times a day (BID) | ORAL | Status: DC
  Filled 2021-08-28: qty 1

## 2021-08-28 MED ORDER — ONDANSETRON HCL 4 MG PO TABS: 4.0000 mg | ORAL_TABLET | ORAL | Status: DC | PRN

## 2021-08-28 MED ORDER — POTASSIUM CHLORIDE IN NACL 20-0.9 MEQ/L-% IV SOLN
INTRAVENOUS | Status: DC
  Filled 2021-08-28: qty 1000

## 2021-08-28 NOTE — Progress Notes (Signed)
Phoned RN. Have to wait for doctors to make their rounds before we can move forward.  ?

## 2021-08-28 NOTE — Progress Notes (Signed)
LTM order was cancelled by Agarwala. He didn't cancel the routine. Messaged RN to see if that needs to be cancelled as well ?

## 2021-08-28 NOTE — Progress Notes (Signed)
Transcranial Doppler ? ?Date POD PCO2 HCT BP  MCA ACA PCA OPHT SIPH VERT Basilar  ?4/30 ?CK     Right  ?Left  ? *  ?*  ? *  ?*  ? *  ?*  ? 30  ?37  ? *  ?*  ? 11  ?16  ? *  ?  ?  ?     Right  ?Left  ?   ?  ?   ?  ?   ?  ?   ?  ?   ?  ?   ?  ?   ?  ?  ?     Right  ?Left  ?   ?  ?   ?  ?   ?  ?   ?  ?   ?  ?   ?  ?   ?  ?  ? ?     Right  ?Left  ?   ?  ?   ?  ?   ?  ?   ?  ?   ?  ?   ?  ?   ?  ?  ? ?     Right  ?Left  ?   ?  ?   ?  ?   ?  ?   ?  ?   ?  ?   ?  ?   ?  ?  ?     Right  ?Left  ?   ?  ?   ?  ?   ?  ?   ?  ?   ?  ?   ?  ?   ?  ?  ?     Right  ?Left  ?   ?  ?   ?  ?   ?  ?   ?  ?   ?  ?   ?  ?   ?  ?  ? ?MCA = Middle Cerebral Artery      OPHT = Opthalmic Artery     BASILAR = Basilar Artery   ?ACA = Anterior Cerebral Artery     SIPH = Carotid Siphon ?PCA = Posterior Cerebral Artery   VERT = Verterbral Artery                  ? ?Normal ?MCA = 62+\-12 ?ACA = 50+\-12 ?PCA = 42+\-23  ? ? ? ? ?

## 2021-08-28 NOTE — Progress Notes (Signed)
? ? ?Referring Physician(s): Code stroke/CCM ? ?Supervising Physician: Julieanne Cotton ? ?Patient Status:  [facility] - In-pt ? 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 ?Subjective: ? ?Patient seen with Dr. Corliss Skains, remains heavily sedated/intubated. Son at bedside who reports that she has not opened her eyes or responded to his voice, he has noticed some jerking motions intermittently that do not seem to correspond with voice or touch. He notes that she has responded to painful stimuli but was told she is heavily sedated so they cannot get a good exam until her sedation is weaned. ? ?Allergies: ?Morphine and Penicillins ? ?Medications: ?Prior to Admission medications   ?Medication Sig Start Date End Date Taking? Authorizing Provider  ?Magnesium 200 MG TABS Take 1 tablet by mouth daily.   Yes [provider]  ?nitroGLYCERIN (NITROSTAT) 0.4 MG SL tablet Place 0.4 mg under the tongue every 5 (five) minutes as needed for chest pain.   Yes [provider]  ?OVER THE COUNTER MEDICATION Take 1 tablet by mouth daily. Super Beets   Yes [provider]  ?zinc gluconate 50 MG tablet Take 50 mg by mouth daily.   Yes [provider]  ? ? ? ?Vital Signs: ?BP 127/72   Pulse 80   Temp 99.1 ?F (37.3 ?C)   Resp 20   Ht 5\' 6"  (1.676 m)   Wt 181 lb 3.5 oz (82.2 kg)   SpO2 100%   BMI 29.25 kg/m?  ? ?Physical Exam ?Vitals and nursing note reviewed.  ?Constitutional:   ?   Comments: Sedated, intubated. Does not arouse to verbal/tactile cues or sternal rub.  ?HENT:  ?   Head:  ?   Comments: (+) left head JP with bright red blood present, numerous bandages present without blood noted ?Cardiovascular:  ?   Rate and Rhythm: Normal rate.  ?   Comments: (+) Right CFA puncture site with syringe and multiple pieces of gauze/tape present - all removed at bedside during without active bleeding noted. Soft, non pulsatile. ?Pulmonary:  ?   Comments: Vent sounds ?Abdominal:   ?   Palpations: Abdomen is soft.  ?Skin: ?   General: Skin is warm and dry.  ?Neurological:  ?   Comments: Does not arouse to sternal rub, does not blink to threat, pupils equal and round, does not track when eyes held open. Do not respond to verbal cues.  ? ? ? ?Imaging: ?CT ANGIO HEAD W OR WO CONTRAST ? ?Result Date: 08/27/2021 ?CLINICAL DATA:  Intracranial hemorrhage EXAM: CT ANGIOGRAPHY HEAD AND NECK TECHNIQUE: Multidetector CT imaging of the head and neck was performed using the standard protocol during bolus administration of intravenous contrast. Multiplanar CT image reconstructions and MIPs were obtained to evaluate the vascular anatomy. Carotid stenosis measurements (when applicable) are obtained utilizing NASCET criteria, using the distal internal carotid diameter as the denominator. RADIATION DOSE REDUCTION: This exam was performed according to the departmental dose-optimization program which includes automated exposure control, adjustment of the mA and/or kV according to patient size and/or use of iterative reconstruction technique. CONTRAST:  08/29/2021 OMNIPAQUE IOHEXOL 350 MG/ML SOLN COMPARISON:  CT head earlier same day FINDINGS: CT HEAD Brain: Extensive subarachnoid hemorrhage is again identified. Stable left cerebral convexity subdural hematoma. Rightward midline shift presently measures 8 mm. There is may be early trapping of the right lateral ventricle. Minimal layering intraventricular hemorrhage reflecting recirculation. Gray-white differentiation is preserved. Vascular: Better evaluated on CTA portion. Skull: Calvarium is unremarkable. Sinuses/Orbits: No  acute finding. Other: None. Review of the MIP images confirms the above findings CTA NECK Aortic arch: Trace calcified plaque. Great vessel origins are patent Right carotid system: Patent. Mild calcified plaque at the bifurcation. No stenosis. Left carotid system: Patent. Mild calcified plaque at the bifurcation and along the proximal internal  carotid. No stenosis. Vertebral arteries: Patent and relatively small in caliber due to prominent posterior communicating arteries intracranially. No stenosis. Skeleton: Mild cervical spine degenerative changes. Other neck: Endotracheal and enteric tubes are present. Upper chest: No acute abnormality. Review of the MIP images confirms the above findings CTA HEAD Aneurysm: Arising at the origin of the left posterior communicating artery, there is a posteriorly directed aneurysm. There is a narrow neck at the origin measuring about 2 mm. Aneurysm widens to about 3.4 mm and measures about 7 mm from base to apex. Anterior circulation: Intracranial internal carotid arteries are patent with trace calcified plaque. Anterior cerebral arteries are patent. 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 Anterior communicating artery is present. Middle cerebral arteries are patent. Posterior circulation: Intracranial vertebral arteries, basilar artery, and posterior cerebral arteries are patent. Bilateral posterior communicating arteries are present. Venous sinuses: Patent as allowed by contrast bolus timing. Review of the MIP images confirms the above findings IMPRESSION: Stable large volume subarachnoid hemorrhage and left cerebral convexity subdural hematoma. Rightward midline shift presently measures 8 mm with possible early trapping of the right lateral ventricle. 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 Electronically Signed   By: Guadlupe SpanishPraneil  Patel M.D.   On: 08/27/2021 18:28  ? ?DG Abd 1 View ? ?Result Date: 08/28/2021 ?CLINICAL DATA:  Check gastric catheter placement EXAM: ABDOMEN - 1 VIEW COMPARISON:  None. FINDINGS: Gastric catheter is noted extending into the distal stomach. No free air is seen. No other focal abnormality is noted. IMPRESSION: Gastric catheter within the distal stomach. Electronically Signed   By: Alcide CleverMark  Lukens M.D.   On: 08/28/2021 01:32  ? ?CT HEAD WO CONTRAST ? ?Result  Date: 08/28/2021 ?CLINICAL DATA:  66 year old female with acute subarachnoid hemorrhage and left subdural hematoma, ruptured left posterior communicating artery aneurysm. Postoperative day 1 status post endovascular coil embolization and craniotomy and evacuation of subdural hematoma EXAM: CT HEAD WITHOUT CONTRAST TECHNIQUE: Contiguous axial images were obtained from the base of the skull through the vertex without intravenous contrast. RADIATION DOSE REDUCTION: This exam was performed according to the departmental dose-optimization program which includes automated exposure control, adjustment of the mA and/or kV according to patient size and/or use of iterative reconstruction technique. COMPARISON:  None. FINDINGS: Brain: Streak artifact left posterior communicating artery region embolization coil pack now. Status post left subdural hematoma evacuation with subdural drain in place. Largely resolved midline shift and mass effect on the lateral ventricles (series 3, image 20). Basilar cistern patency appears improved. Residual subarachnoid hemorrhage left greater than right. Small volume IVH. No significant ventriculomegaly. No acute cortically based infarct identified. Vascular: Left posterior communicating artery region embolization coil pack. Skull: New left side craniectomy. Sinuses/Orbits: Visualized paranasal sinuses and mastoids are stable and well aerated. Other: Postoperative changes to the left scalp. Skin staples in place. Negative orbits. Intubated. Fluid in the pharynx. IMPRESSION: 1. Improved status post embolization of ruptured left Pcomm aneurysm, left subdural hematoma evacuation and craniectomy. 2. Largely resolved midline shift. No significant ventriculomegaly. Small volume intraventricular hemorrhage. 3. Residual subarachnoid hemorrhage. No acute infarct identified by plain CT. Electronically Signed   By: Althea GrimmerH  Hall M.D.  On: 08/28/2021 05:48  ? ?CT Head Wo Contrast ? ?Result Date:  08/27/2021 ?CLINICAL DATA:  Sudden severe headache EXAM: CT HEAD WITHOUT CONTRAST CT CERVICAL SPINE WITHOUT CONTRAST TECHNIQUE: Multidetector CT imaging of the head and cervical spine was performed following the standard protocol

## 2021-08-28 NOTE — Progress Notes (Signed)
Subjective: ?Patient reports  remains intubated and sedated ? ?Objective: ?Vital signs in last 24 hours: ?Temp:  [92.5 ?F (33.6 ?C)-99.3 ?F (37.4 ?C)] 99.1 ?F (37.3 ?C) (04/30 6226) ?Pulse Rate:  [33-96] 80 (04/30 0727) ?Resp:  [12-25] 20 (04/30 0727) ?BP: (95-237)/(59-188) 127/72 (04/30 0727) ?SpO2:  [81 %-100 %] 100 % (04/30 0728) ?Arterial Line BP: (108-160)/(49-77) 146/65 (04/30 0700) ?FiO2 (%):  [40 %-50 %] 40 % (04/30 0728) ?Weight:  [81.6 kg-82.2 kg] 82.2 kg (04/29 2348) ? ?Intake/Output from previous day: ?04/29 0701 - 04/30 0700 ?In: 3207.7 [I.V.:2207.9; IV Piggyback:999.8] ?Out: 3450 [Urine:3230; Drains:45; Blood:175] ?Intake/Output this shift: ?No intake/output data recorded. ? ?Pupils 3-2 right hemiparesis but briskly purposeful on the left dressing dry ? ?Lab Results: ?Recent Labs  ?  08/28/21 ?0336 08/28/21 ?0430  ?WBC 20.7* 20.9*  ?HGB 13.2 12.8  ?HCT 39.6 38.0  ?PLT 261 264  ? ?BMET ?Recent Labs  ?  08/28/21 ?0336 08/28/21 ?0430  ?NA 144 142  ?K 3.7 3.7  ?CL 115* 116*  ?CO2 22 22  ?GLUCOSE 122* 119*  ?BUN 6* 6*  ?CREATININE 0.69 0.70  ?CALCIUM 7.8* 7.6*  ? ? ?Studies/Results: ?CT ANGIO HEAD W OR WO CONTRAST ? ?Result Date: 08/27/2021 ?CLINICAL DATA:  Intracranial hemorrhage EXAM: CT ANGIOGRAPHY HEAD AND NECK TECHNIQUE: Multidetector CT imaging of the head and neck was performed using the standard protocol during bolus administration of intravenous contrast. Multiplanar CT image reconstructions and MIPs were obtained to evaluate the vascular anatomy. Carotid stenosis measurements (when applicable) are obtained utilizing NASCET criteria, using the distal internal carotid diameter as the denominator. RADIATION DOSE REDUCTION: This exam was performed according to the departmental dose-optimization program which includes automated exposure control, adjustment of the mA and/or kV according to patient size and/or use of iterative reconstruction technique. CONTRAST:  OMNIPAQUE IOHEXOL 350 MG/ML SOLN  COMPARISON:  CT head earlier same day FINDINGS: CT HEAD Brain: Extensive subarachnoid hemorrhage is again identified. Stable left cerebral convexity subdural hematoma. Rightward midline shift presently measures 8 mm. There is may be early trapping of the right lateral ventricle. Minimal layering intraventricular hemorrhage reflecting recirculation. Gray-white differentiation is preserved. Vascular: Better evaluated on CTA portion. Skull: Calvarium is unremarkable. Sinuses/Orbits: No acute finding. Other: None. Review of the MIP images confirms the above findings CTA NECK Aortic arch: Trace calcified plaque. Great vessel origins are patent Right carotid system: Patent. Mild calcified plaque at the bifurcation. No stenosis. Left carotid system: Patent. Mild calcified plaque at the bifurcation and along the proximal internal carotid. No stenosis. Vertebral arteries: Patent and relatively small in caliber due to prominent posterior communicating arteries intracranially. No stenosis. Skeleton: Mild cervical spine degenerative changes. Other neck: Endotracheal and enteric tubes are present. Upper chest: No acute abnormality. Review of the MIP images confirms the above findings CTA HEAD Aneurysm: Arising at the origin of the left posterior communicating artery, there is a posteriorly directed aneurysm. There is a narrow neck at the origin measuring about 2 mm. Aneurysm widens to about 3.4 mm and measures about 7 mm from base to apex. Anterior circulation: Intracranial internal carotid arteries are patent with trace calcified plaque. Anterior cerebral arteries are patent. 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 Anterior communicating artery is present. Middle cerebral arteries are patent. Posterior circulation: Intracranial vertebral arteries, basilar artery, and posterior cerebral arteries are patent. Bilateral posterior communicating arteries are present. Venous sinuses: Patent as allowed by contrast bolus timing. Review of  the MIP images confirms the above findings IMPRESSION: Stable  large volume subarachnoid hemorrhage and left cerebral convexity subdural hematoma. Rightward midline shift presently measures 8 mm with possible early trapping of the right lateral ventricle. 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 Electronically Signed   By: Guadlupe Spanish M.D.   On: 08/27/2021 18:28  ? ?DG Abd 1 View ? ?Result Date: 08/28/2021 ?CLINICAL DATA:  Check gastric catheter placement EXAM: ABDOMEN - 1 VIEW COMPARISON:  None. FINDINGS: Gastric catheter is noted extending into the distal stomach. No free air is seen. No other focal abnormality is noted. IMPRESSION: Gastric catheter within the distal stomach. Electronically Signed   By: Alcide Clever M.D.   On: 08/28/2021 01:32  ? ?CT HEAD WO CONTRAST ? ?Result Date: 08/28/2021 ?CLINICAL DATA:  66 year old female with acute subarachnoid hemorrhage and left subdural hematoma, ruptured left posterior communicating artery aneurysm. Postoperative day 1 status post endovascular coil embolization and craniotomy and evacuation of subdural hematoma EXAM: CT HEAD WITHOUT CONTRAST TECHNIQUE: Contiguous axial images were obtained from the base of the skull through the vertex without intravenous contrast. RADIATION DOSE REDUCTION: This exam was performed according to the departmental dose-optimization program which includes automated exposure control, adjustment of the mA and/or kV according to patient size and/or use of iterative reconstruction technique. COMPARISON:  None. FINDINGS: Brain: Streak artifact left posterior communicating artery region embolization coil pack now. Status post left subdural hematoma evacuation with subdural drain in place. Largely resolved midline shift and mass effect on the lateral ventricles (series 3, image 20). Basilar cistern patency appears improved. Residual subarachnoid hemorrhage left greater than right. Small volume IVH. No  significant ventriculomegaly. No acute cortically based infarct identified. Vascular: Left posterior communicating artery region embolization coil pack. Skull: New left side craniectomy. Sinuses/Orbits: Visualized paranasal sinuses and mastoids are stable and well aerated. Other: Postoperative changes to the left scalp. Skin staples in place. Negative orbits. Intubated. Fluid in the pharynx. IMPRESSION: 1. Improved status post embolization of ruptured left Pcomm aneurysm, left subdural hematoma evacuation and craniectomy. 2. Largely resolved midline shift. No significant ventriculomegaly. Small volume intraventricular hemorrhage. 3. Residual subarachnoid hemorrhage. No acute infarct identified by plain CT. Electronically Signed   By: Odessa Fleming M.D.   On: 08/28/2021 05:48  ? ?CT Head Wo Contrast ? ?Result Date: 08/27/2021 ?CLINICAL DATA:  Sudden severe headache EXAM: CT HEAD WITHOUT CONTRAST CT CERVICAL SPINE WITHOUT CONTRAST TECHNIQUE: Multidetector CT imaging of the head and cervical spine was performed following the standard protocol without intravenous contrast. Multiplanar CT image reconstructions of the cervical spine were also generated. RADIATION DOSE REDUCTION: This exam was performed according to the departmental dose-optimization program which includes automated exposure control, adjustment of the mA and/or kV according to patient size and/or use of iterative reconstruction technique. COMPARISON:  None FINDINGS: CT HEAD FINDINGS Brain: Abnormal exam. Extensive subarachnoid hemorrhage at basilar cisterns, interhemispheric fissure, BILATERAL sylvian fissures greater on LEFT. Additional large acute subdural hematoma at LEFT frontal, parietal and temporal regions measuring up to 14 mm thick at the frontal parietal region. Large collection of extra-axial high attenuation blood identified inferior to the LEFT temporal lobe 20 mm thick. Marked mass effect upon the LEFT hemisphere with 13 mm of the LEFT to RIGHT  mediastinal shift. Marked compression of both lateral ventricles. No intraparenchymal hemorrhage or intraventricular hemorrhage identified. No evidence of acute infarction. Vascular: No definite abnormalities id

## 2021-08-28 NOTE — Progress Notes (Signed)
Pharmacy Antibiotic Note ? ?Kayla Maynard is a 66 y.o. female admitted on 08/27/2021 with  SAH/SDH s/p coiling and crani .  Pharmacy has been consulted for vancomycin dosing for surgical prophylaxis. Given 1x dose overnight. Patient has JP drain in place. SCr 0.77 stable. ? ?Plan: ?Continue vancomycin 1g IV q12h with drain in place per protocol ?Monitor SCr trend closely, vancomycin levels as indicated ?D/c when drain removed ? ?Height: 5\' 6"  (167.6 cm) ?Weight: 82.2 kg (181 lb 3.5 oz) ?IBW/kg (Calculated) : 59.3 ? ?Temp (24hrs), Avg:97.4 ?F (36.3 ?C), Min:92.5 ?F (33.6 ?C), Max:100.2 ?F (37.9 ?C) ? ?Recent Labs  ?Lab 08/27/21 ?1443 08/28/21 ?0336 08/28/21 ?0430  ?WBC 11.2* 20.7* 20.9*  ?CREATININE 0.77 0.69 0.70  ?  ?Estimated Creatinine Clearance: 77.8 mL/min (by C-G formula based on SCr of 0.7 mg/dL).   ? ?Allergies  ?Allergen Reactions  ? Morphine   ? Penicillins Hives  ? ? ?Arturo Morton, PharmD, BCPS ?Please check AMION for all [place] contact numbers ?Clinical Pharmacist ?08/28/2021 12:56 PM ? ? ?

## 2021-08-28 NOTE — Consult Note (Signed)
? ?NAME:  Kayla Maynard, MRN:  [id number], DOB:  May 03, 1955, LOS: 1 ?ADMISSION DATE:  08/27/2021, CONSULTATION DATE:  08/27/21 ?REFERRING MD:  Saintclair Halsted - NSGY, CHIEF COMPLAINT:  SAH, endotracheally intubated  ? ?History of Present Illness:  ?66 yo F PMH angina presented to [place] 4/29 after loss of consciousness at home. Family actually started CPR per 911 advice though unclear if pulses were actually lost -- sounds like there was hypertonicity and myoclonus, more concerning for seizure. On EMS arrival pt awake, hypoglycemic, got d10, was awake ? ?In ED denied hx sz. Endorsed HA and neck pain, worse x 2 wk. Had 2 additional sz in ED. Intubated for airway protection. Found to have ICH -- SAH, SDH. Became bradycardic, Started on 3% and cleviprex  ? ?Transferred [facility] to [facility] for further care ? ?Pertinent  Medical History  ?Angina  ? ?Significant Hospital Events: ?Including procedures, antibiotic start and stop dates in addition to other pertinent events   ?4/29  multiple seizures. Hypoglycemia (factitious). Intubated. Found to have SAH, SDH. 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  Dominant left posterior communicating artery patent ?4/29 Left-sided pterional craniotomy for evacuation of left-sided subdural hematoma with implantation of the bone flap in the left abdominal wall ?4/30 considerable improvement postintervention with near complete resolution of midline shift. ? ?Interim History / Subjective:  ? ?With sedation paused patient will move all limbs purposefully. ? ?Objective   ?Blood pressure (!) 87/52, pulse 73, temperature 100.2 ?F (37.9 ?C), temperature source Bladder, resp. rate 20, height 5\' 6"  (1.676 m), weight 82.2 kg, SpO2 98 %. ?   ?Vent Mode: PRVC ?FiO2 (%):  [40 %-50 %] 40 % ?Set Rate:  [20 bmp] 20 bmp ?Vt Set:  [480 mL-500 mL]  480 mL ?PEEP:  [5 cmH20] 5 cmH20 ?Plateau Pressure:  [11 cmH20-17 cmH20] 11 cmH20  ? ?Intake/Output Summary (Last 24 hours) at 08/28/2021 1320 ?Last data filed at 08/28/2021 1300 ?Gross per 24 hour  ?Intake 4397.11 ml  ?Output 4550 ml  ?Net -152.89 ml  ? ? ?Filed Weights  ? 08/27/21 1411 08/27/21 2348  ?Weight: 81.6 kg 82.2 kg  ? ? ?Examination: ?General: Critically ill adult F intubated sedated  ?HENT: NCAT pink mm ETT secure.  OG tube in place.  Left craniotomy incision well opposed soft.  Minimal facial swelling.  Subgaleal drain with sanguinous fluid. ?Lungs: CTAb mechanically ventilated  ?Cardiovascular: rrr s1s2 cap refill < 3 ?Abdomen: soft ndnt + bowel sounds  ?Extremities: no acute joints.  No edema. ?Neuro: On my exam, sedated on 80 propofol.  Minimal response to pain on left side. ?GU: foley, clear urine  ? ?Ancillary tests personally reviewed  ?ABG 7. 34/38.8/132 ?Mild hyperchloremia ?Leukocytosis 20.9 ? ?Assessment & Plan:  ?On mechanically assisted ventilation ([place]) ?For airway protection due to coma from [facility]. ? ?- Full mechanical ventilatory support with lung protective strategy. Limit PEEP to <10 to avoid increasing ICP ?- Titrate ventilation to normal ABG.  ?- Use propofol infusion, prn fentanyl for comfort during procedure.  ?- Consider change to Precedex ? ?Compression of brain due to nontraumatic subarachnoid hemorrhage ([place]) ?Due to combination of SAH and SDH.  ?Received 250 bolus of 3% saline at [facility] ?Now resolved. ? ?- Off hypertonic saline ?-Continue to monitor sodium every 6 hours for next 24 hours as high urine output.  At risk  for cerebral salt wasting. ? ?Subdural hematoma, acute ([place]) ?Due to Pcomm aneurysm successful surgical drainage. ? ?-Subgaleal drain management per neurosurgery ? ?Nontraumatic subarach bleed from left posterior communicating artery ([place]) ?Hunt-Hess: 5 following seizure, but initially following commands (rebleed?)  ?Fisher III  - 35 % chance of vasospasm. ? ?-Allow  blood pressure to rise, with target 1 30-1 50. ?-Moves all limbs purposefully on best exam ?-Minimize sedation to maintain best exam.  Low-dose propofol with as needed Dilaudid. ?- Start nimodipine to prevent vasospasm, dose 30 every 2 to minimize hypotension. ?- Serial TCD's  ?- Maintain euvolemia.  ? ?Seizure ([place]) ?Hunt Hess closer to 2-3 initially, mental status worse since seizure. Ongoing non-convulsive status or post-ictal state may be contributing to worse examination.  ? ?- Continue Keppra post load.  ?-Can wean propofol.  Patient is moving all limbs so ongoing seizures unlikely. ? ? ?Best Practice (right click and "Reselect all SmartList Selections" daily)  ? ?Diet/type: NPO Will start tube feeds ?DVT prophylaxis: SCD start chemical prophylaxis tomorrow ?GI prophylaxis: PPI ?Lines: N/A Central line is still require ?Foley:  Yes, and it is still needed ?Code Status:  full code ?Last date of multidisciplinary goals of care discussion [patient's son was updated at bedside 4/30] ? ?CRITICAL CARE ?Performed by: Kipp Brood ? ? ?Total critical care time: 45 minutes ? ?Critical care time was exclusive of separately billable procedures and treating other patients. ? ?Critical care was necessary to treat or prevent imminent or life-threatening deterioration. ? ?Critical care was time spent personally by me on the following activities: development of treatment plan with patient and/or surrogate as well as nursing, discussions with consultants, evaluation of patient's response to treatment, examination of patient, obtaining history from patient or surrogate, ordering and performing treatments and interventions, ordering and review of laboratory studies, ordering and review of radiographic studies, pulse oximetry, re-evaluation of patient's condition and participation in multidisciplinary rounds. ? ?Kipp Brood, MD FRCPC ?ICU Physician ?[place]  ?Pager: [phone] ?Mobile: [phone] ?After  hours: [phone]. ? ? ?08/28/2021, 1:20 PM ? ? ? ?

## 2021-08-28 NOTE — Plan of Care (Signed)
?  Problem: Spontaneous Subarachnoid Hemorrhage Tissue Perfusion: ?Goal: Complications of Spontaneous Subarachnoid Hemorrhage will be minimized ?Outcome: Progressing ?  ?Problem: Education: ?Goal: Knowledge of patient specific risk factors will improve (INDIVIDUALIZE FOR PATIENT) ?Outcome: Not Progressing - intubated and sedated ?  ?

## 2021-08-28 NOTE — Evaluation (Signed)
SLP Cancellation Note ? ?Patient Details ?Name: Kayla Maynard ?MRN: [id number] ?DOB: Jul 24, 1955 ? ? ?Cancelled treatment:       Reason Eval/Treat Not Completed: Patient not medically ready (Intubated,) ? ?Zonie Crutcher MA, CCC-SLP ? ?Marenda Accardi Meryl ?08/28/2021, 1:14 PM ? ? ?

## 2021-08-28 NOTE — Progress Notes (Signed)
Contacted Leo Grosser, NP who was on call for neurosurgery to confirm fluid orders.  Pt had an order for 3% and came up on a 3% gtt from OR.  Pt also had new order for 0.9% NaCl with KCl 20 mEq/L running at 74 mL/hr.  RN clarified orders.  Meyran said to stop the 3% and only run the NS with KCl.  3% stopped at 0143 and NS with KCl started.   ?

## 2021-08-28 NOTE — Progress Notes (Signed)
PT Cancellation Note ? ?Patient Details ?Name: Kayla Maynard ?MRN: [id number] ?DOB: March 21, 1956 ? ? ?Cancelled Treatment:    Reason Eval/Treat Not Completed: Medical issues which prohibited therapy today. Pt remains intubated with poor BP stability and RN asking PT to hold on evaluation at this time. Will return tomorrow and attempt evaluation as appropriate.  ? ?Vickki Muff, PT, DPT  ? ?Acute Rehabilitation Department ?Pager #: [phone] - [phone] ? ? ?[name] ?08/28/2021, 3:29 PM ?

## 2021-08-28 NOTE — Progress Notes (Signed)
Transported Pt to CT on vent then back to room 4N 22. No complications.  ?

## 2021-08-28 NOTE — Anesthesia Postprocedure Evaluation (Signed)
Anesthesia Post Note ? ?Patient: Kayla Maynard ? ?Procedure(s) Performed: RADIOLOGY WITH ANESTHESIA ? ?  ? ?Patient location during evaluation: SICU ?Anesthesia Type: General ?Level of consciousness: sedated ?Pain management: pain level controlled ?Vital Signs Assessment: post-procedure vital signs reviewed and stable ?Respiratory status: patient remains intubated per anesthesia plan ?Cardiovascular status: stable ?Postop Assessment: no apparent nausea or vomiting ?Anesthetic complications: no ? ? ?No notable events documented. ? ?Last Vitals:  ?Vitals:  ? 08/27/21 2337 08/27/21 2348  ?BP: (!) 156/79   ?Pulse: 76 70  ?Resp: 20 (!) 21  ?Temp:  (!) 34.2 ?C  ?SpO2: 98% 100%  ?  ?Last Pain:  ?Vitals:  ? 08/27/21 1553  ?TempSrc: Rectal  ?PainSc:   ? ? ?  ?  ?  ?  ?  ?  ? ?Shelton Silvas ? ? ? ? ?

## 2021-08-28 NOTE — Progress Notes (Signed)
eLink Physician-Brief Progress Note ?Patient Name: Kayla Maynard ?DOB: 10-29-1955 ?MRN: [id number] ? ? ?Date of Service ? 08/28/2021  ?HPI/Events of Note ? 66 yr old female presented with recurrent seizures that required intubation and found to have SAH.  S/p PCA aneurysm coiling and Left-sided craniotomy for evacuation of left-sided subdural hematoma with implantation of the bone flap in the left abdominal wall  ?eICU Interventions ? Chart reviewed  ? ? ? ?Intervention Category ?Evaluation Type: New Patient Evaluation ? Henry Russel, P ?08/28/2021, 12:49 AM ?

## 2021-08-29 ENCOUNTER — Encounter (HOSPITAL_COMMUNITY): Payer: Self-pay | Admitting: Neurosurgery

## 2021-08-29 ENCOUNTER — Inpatient Hospital Stay (HOSPITAL_COMMUNITY): Payer: Medicare Other

## 2021-08-29 DIAGNOSIS — I6032 Nontraumatic subarachnoid hemorrhage from left posterior communicating artery: Principal | ICD-10-CM

## 2021-08-29 DIAGNOSIS — R1310 Dysphagia, unspecified: Secondary | ICD-10-CM | POA: Diagnosis not present

## 2021-08-29 DIAGNOSIS — J9601 Acute respiratory failure with hypoxia: Secondary | ICD-10-CM

## 2021-08-29 DIAGNOSIS — Z9911 Dependence on respirator [ventilator] status: Secondary | ICD-10-CM

## 2021-08-29 LAB — CBC WITH DIFFERENTIAL/PLATELET
Abs Immature Granulocytes: 0.1 10*3/uL — ABNORMAL HIGH (ref 0.00–0.07)
Basophils Absolute: 0 10*3/uL (ref 0.0–0.1)
Basophils Relative: 0 %
Eosinophils Absolute: 0 10*3/uL (ref 0.0–0.5)
Eosinophils Relative: 0 %
HCT: 33 % — ABNORMAL LOW (ref 36.0–46.0)
Hemoglobin: 10.8 g/dL — ABNORMAL LOW (ref 12.0–15.0)
Immature Granulocytes: 1 %
Lymphocytes Relative: 14 %
Lymphs Abs: 2.3 10*3/uL (ref 0.7–4.0)
MCH: 32.9 pg (ref 26.0–34.0)
MCHC: 32.7 g/dL (ref 30.0–36.0)
MCV: 100.6 fL — ABNORMAL HIGH (ref 80.0–100.0)
Monocytes Absolute: 1.3 10*3/uL — ABNORMAL HIGH (ref 0.1–1.0)
Monocytes Relative: 8 %
Neutro Abs: 13 10*3/uL — ABNORMAL HIGH (ref 1.7–7.7)
Neutrophils Relative %: 77 %
Platelets: 185 10*3/uL (ref 150–400)
RBC: 3.28 MIL/uL — ABNORMAL LOW (ref 3.87–5.11)
RDW: 13.4 % (ref 11.5–15.5)
WBC: 16.7 10*3/uL — ABNORMAL HIGH (ref 4.0–10.5)
nRBC: 0 % (ref 0.0–0.2)

## 2021-08-29 LAB — GLUCOSE, CAPILLARY
Glucose-Capillary: 140 mg/dL — ABNORMAL HIGH (ref 70–99)
Glucose-Capillary: 145 mg/dL — ABNORMAL HIGH (ref 70–99)
Glucose-Capillary: 153 mg/dL — ABNORMAL HIGH (ref 70–99)
Glucose-Capillary: 160 mg/dL — ABNORMAL HIGH (ref 70–99)
Glucose-Capillary: 162 mg/dL — ABNORMAL HIGH (ref 70–99)
Glucose-Capillary: 169 mg/dL — ABNORMAL HIGH (ref 70–99)
Glucose-Capillary: 172 mg/dL — ABNORMAL HIGH (ref 70–99)

## 2021-08-29 LAB — MAGNESIUM
Magnesium: 2 mg/dL (ref 1.7–2.4)
Magnesium: 1.9 mg/dL (ref 1.7–2.4)

## 2021-08-29 LAB — TRIGLYCERIDES: Triglycerides: 106 mg/dL (ref <150)

## 2021-08-29 LAB — COMPREHENSIVE METABOLIC PANEL
ALT: 16 U/L (ref 0–44)
AST: 20 U/L (ref 15–41)
Albumin: 2.6 g/dL — ABNORMAL LOW (ref 3.5–5.0)
Alkaline Phosphatase: 61 U/L (ref 38–126)
Anion gap: 5 (ref 5–15)
BUN: 7 mg/dL — ABNORMAL LOW (ref 8–23)
CO2: 20 mmol/L — ABNORMAL LOW (ref 22–32)
Calcium: 7.7 mg/dL — ABNORMAL LOW (ref 8.9–10.3)
Chloride: 119 mmol/L — ABNORMAL HIGH (ref 98–111)
Creatinine, Ser: 0.63 mg/dL (ref 0.44–1.00)
GFR, Estimated: >60 mL/min (ref >60)
Glucose, Bld: 150 mg/dL — ABNORMAL HIGH (ref 70–99)
Potassium: 3.4 mmol/L — ABNORMAL LOW (ref 3.5–5.1)
Sodium: 144 mmol/L (ref 135–145)
Total Bilirubin: 0.6 mg/dL (ref 0.3–1.2)
Total Protein: 4.9 g/dL — ABNORMAL LOW (ref 6.5–8.1)

## 2021-08-29 LAB — PHOSPHORUS
Phosphorus: 2.1 mg/dL — ABNORMAL LOW (ref 2.5–4.6)
Phosphorus: 2.3 mg/dL — ABNORMAL LOW (ref 2.5–4.6)

## 2021-08-29 LAB — SODIUM
Sodium: 142 mmol/L (ref 135–145)
Sodium: 142 mmol/L (ref 135–145)
Sodium: 143 mmol/L (ref 135–145)

## 2021-08-29 MED ORDER — POTASSIUM PHOSPHATES 15 MMOLE/5ML IV SOLN
15.0000 mmol | Freq: Once | INTRAVENOUS | Status: AC
  Administered 2021-08-29: 15 mmol via INTRAVENOUS
  Filled 2021-08-29: qty 5

## 2021-08-29 MED ORDER — VITAL 1.5 CAL PO LIQD
1000.0000 mL | ORAL | Status: AC
  Administered 2021-08-29 – 2021-09-01 (×5): 1000 mL

## 2021-08-29 MED ORDER — POTASSIUM & SODIUM PHOSPHATES 280-160-250 MG PO PACK
2.0000 | PACK | Freq: Once | ORAL | Status: AC
  Administered 2021-08-29: 2
  Filled 2021-08-29: qty 2

## 2021-08-29 MED ORDER — PROSOURCE TF PO LIQD
45.0000 mL | Freq: Three times a day (TID) | ORAL | Status: DC
  Administered 2021-08-29 – 2021-09-12 (×42): 45 mL
  Filled 2021-08-29 (×42): qty 45

## 2021-08-29 NOTE — Progress Notes (Signed)
Subjective: ?Patient intubate and sedated  ? ?Objective: ?Vital signs in last 24 hours: ?Temp:  [98.8 ?F (37.1 ?C)-100.2 ?F (37.9 ?C)] 98.8 ?F (37.1 ?C) (05/01 0800) ?Pulse Rate:  [57-90] 61 (05/01 0900) ?Resp:  [15-23] 17 (05/01 0900) ?BP: (87-135)/(51-98) 106/55 (05/01 0900) ?SpO2:  [95 %-100 %] 96 % (05/01 0900) ?Arterial Line BP: (94-162)/(44-79) 130/56 (05/01 0900) ?FiO2 (%):  [30 %-40 %] 30 % (05/01 0736) ? ?Intake/Output from previous day: ?04/30 0701 - 05/01 0700 ?In: 5832.6 [I.V.:4486.7; NG/GT:808; IV Piggyback:537.9] ?Out: 3140 [Urine:2985; Drains:155] ?Intake/Output this shift: ?Total I/O ?In: 351 [I.V.:271; NG/GT:80] ?Out: -  ? ? ? ?Lab Results: ?Lab Results  ?Component Value Date  ? WBC 16.7 (H) 08/29/2021  ? HGB 10.8 (L) 08/29/2021  ? HCT 33.0 (L) 08/29/2021  ? MCV 100.6 (H) 08/29/2021  ? PLT 185 08/29/2021  ? ?Lab Results  ?Component Value Date  ? INR 1.3 (H) 08/27/2021  ? ?BMET ?Lab Results  ?Component Value Date  ? NA 144 08/29/2021  ? K 3.4 (L) 08/29/2021  ? CL 119 (H) 08/29/2021  ? CO2 20 (L) 08/29/2021  ? GLUCOSE 150 (H) 08/29/2021  ? BUN 7 (L) 08/29/2021  ? CREATININE 0.63 08/29/2021  ? CALCIUM 7.7 (L) 08/29/2021  ? ? ?Studies/Results: ?CT ANGIO HEAD W OR WO CONTRAST ? ?Result Date: 08/27/2021 ?CLINICAL DATA:  Intracranial hemorrhage EXAM: CT ANGIOGRAPHY HEAD AND NECK TECHNIQUE: Multidetector CT imaging of the head and neck was performed using the standard protocol during bolus administration of intravenous contrast. Multiplanar CT image reconstructions and MIPs were obtained to evaluate the vascular anatomy. Carotid stenosis measurements (when applicable) are obtained utilizing NASCET criteria, using the distal internal carotid diameter as the denominator. RADIATION DOSE REDUCTION: This exam was performed according to the departmental dose-optimization program which includes automated exposure control, adjustment of the mA and/or kV according to patient size and/or use of iterative  reconstruction technique. CONTRAST:  OMNIPAQUE IOHEXOL 350 MG/ML SOLN COMPARISON:  CT head earlier same day FINDINGS: CT HEAD Brain: Extensive subarachnoid hemorrhage is again identified. Stable left cerebral convexity subdural hematoma. Rightward midline shift presently measures 8 mm. There is may be early trapping of the right lateral ventricle. Minimal layering intraventricular hemorrhage reflecting recirculation. Gray-white differentiation is preserved. Vascular: Better evaluated on CTA portion. Skull: Calvarium is unremarkable. Sinuses/Orbits: No acute finding. Other: None. Review of the MIP images confirms the above findings CTA NECK Aortic arch: Trace calcified plaque. Great vessel origins are patent Right carotid system: Patent. Mild calcified plaque at the bifurcation. No stenosis. Left carotid system: Patent. Mild calcified plaque at the bifurcation and along the proximal internal carotid. No stenosis. Vertebral arteries: Patent and relatively small in caliber due to prominent posterior communicating arteries intracranially. No stenosis. Skeleton: Mild cervical spine degenerative changes. Other neck: Endotracheal and enteric tubes are present. Upper chest: No acute abnormality. Review of the MIP images confirms the above findings CTA HEAD Aneurysm: Arising at the origin of the left posterior communicating artery, there is a posteriorly directed aneurysm. There is a narrow neck at the origin measuring about 2 mm. Aneurysm widens to about 3.4 mm and measures about 7 mm from base to apex. Anterior circulation: Intracranial internal carotid arteries are patent with trace calcified plaque. Anterior cerebral arteries are patent. 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 Anterior communicating artery is present. Middle cerebral arteries are patent. Posterior circulation: Intracranial vertebral arteries, basilar artery, and posterior cerebral arteries are patent. Bilateral posterior communicating arteries are  present. Venous sinuses:  Patent as allowed by contrast bolus timing. Review of the MIP images confirms the above findings IMPRESSION: Stable large volume subarachnoid hemorrhage and left cerebral convexity subdural hematoma. Rightward midline shift presently measures 8 mm with possible early trapping of the right lateral ventricle. 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 Electronically Signed   By: Macy Mis M.D.   On: 08/27/2021 18:28  ? ?DG Abd 1 View ? ?Result Date: 08/28/2021 ?CLINICAL DATA:  Check gastric catheter placement EXAM: ABDOMEN - 1 VIEW COMPARISON:  None. FINDINGS: Gastric catheter is noted extending into the distal stomach. No free air is seen. No other focal abnormality is noted. IMPRESSION: Gastric catheter within the distal stomach. Electronically Signed   By: Inez Catalina M.D.   On: 08/28/2021 01:32  ? ?CT HEAD WO CONTRAST ? ?Result Date: 08/28/2021 ?CLINICAL DATA:  66 year old female with acute subarachnoid hemorrhage and left subdural hematoma, ruptured left posterior communicating artery aneurysm. Postoperative day 1 status post endovascular coil embolization and craniotomy and evacuation of subdural hematoma EXAM: CT HEAD WITHOUT CONTRAST TECHNIQUE: Contiguous axial images were obtained from the base of the skull through the vertex without intravenous contrast. RADIATION DOSE REDUCTION: This exam was performed according to the departmental dose-optimization program which includes automated exposure control, adjustment of the mA and/or kV according to patient size and/or use of iterative reconstruction technique. COMPARISON:  None. FINDINGS: Brain: Streak artifact left posterior communicating artery region embolization coil pack now. Status post left subdural hematoma evacuation with subdural drain in place. Largely resolved midline shift and mass effect on the lateral ventricles (series 3, image 20). Basilar cistern patency appears improved.  Residual subarachnoid hemorrhage left greater than right. Small volume IVH. No significant ventriculomegaly. No acute cortically based infarct identified. Vascular: Left posterior communicating artery region embolization coil pack. Skull: New left side craniectomy. Sinuses/Orbits: Visualized paranasal sinuses and mastoids are stable and well aerated. Other: Postoperative changes to the left scalp. Skin staples in place. Negative orbits. Intubated. Fluid in the pharynx. IMPRESSION: 1. Improved status post embolization of ruptured left Pcomm aneurysm, left subdural hematoma evacuation and craniectomy. 2. Largely resolved midline shift. No significant ventriculomegaly. Small volume intraventricular hemorrhage. 3. Residual subarachnoid hemorrhage. No acute infarct identified by plain CT. Electronically Signed   By: Genevie Ann M.D.   On: 08/28/2021 05:48  ? ?CT Head Wo Contrast ? ?Result Date: 08/27/2021 ?CLINICAL DATA:  Sudden severe headache EXAM: CT HEAD WITHOUT CONTRAST CT CERVICAL SPINE WITHOUT CONTRAST TECHNIQUE: Multidetector CT imaging of the head and cervical spine was performed following the standard protocol without intravenous contrast. Multiplanar CT image reconstructions of the cervical spine were also generated. RADIATION DOSE REDUCTION: This exam was performed according to the departmental dose-optimization program which includes automated exposure control, adjustment of the mA and/or kV according to patient size and/or use of iterative reconstruction technique. COMPARISON:  None FINDINGS: CT HEAD FINDINGS Brain: Abnormal exam. Extensive subarachnoid hemorrhage at basilar cisterns, interhemispheric fissure, BILATERAL sylvian fissures greater on LEFT. Additional large acute subdural hematoma at LEFT frontal, parietal and temporal regions measuring up to 14 mm thick at the frontal parietal region. Large collection of extra-axial high attenuation blood identified inferior to the LEFT temporal lobe 20 mm thick.  Marked mass effect upon the LEFT hemisphere with 13 mm of the LEFT to RIGHT mediastinal shift. Marked compression of both lateral ventricles. No intraparenchymal hemorrhage or intraventricular hemorrhage identified. No ev

## 2021-08-29 NOTE — Procedures (Signed)
Cortrak ? ?Person Inserting Tube:  Arlyss Gandy, RD ?Tube Type:  Cortrak - 43 inches ?Tube Size:  10 ?Tube Location:  Right nare ?Secured by: Flat Lick Callas ?Technique Used to Measure Tube Placement:  Marking at nare/corner of mouth ?Cortrak Secured At:  63 cm ? ?Cortrak Tube Team Note: ? ?Consult received to place a Cortrak feeding tube.  ? ?X-ray is required, abdominal x-ray has been ordered by the Cortrak team. Please confirm tube placement before using the Cortrak tube.  ? ?If the tube becomes dislodged please keep the tube and contact the Cortrak team at [email or web address] (password [email or web address]) for replacement.  ?If after hours and replacement cannot be delayed, place a NG tube and confirm placement with an abdominal x-ray.  ? ? ?Cammy Copa., RD, LDN, CNSC ?See AMiON for contact information  ? ? ?

## 2021-08-29 NOTE — Progress Notes (Signed)
Initial Nutrition Assessment ? ?DOCUMENTATION CODES:  ? ?Not applicable ? ?INTERVENTION:  ? ?Initiate tube feeds via Cortrak: ?- Vital 1.5 @ 50 ml/hr (1200 ml/day) ?- ProSource TF 45 ml TID ? ?Tube feeding regimen provides 1920 kcal, 114 grams of protein, and 917 ml of H2O.  ? ?NUTRITION DIAGNOSIS:  ? ?Inadequate oral intake related to inability to eat as evidenced by NPO status. ? ?GOAL:  ? ?Patient will meet greater than or equal to 90% of their needs ? ?MONITOR:  ? ?Vent status, Labs, Weight trends, TF tolerance, Skin, I & O's ? ?REASON FOR ASSESSMENT:  ? ?Ventilator, Consult ?Enteral/tube feeding initiation and management ? ?ASSESSMENT:  ? ?66 year old female who presented to the ED on 4/29 after being found unresponsive by family. PMH of angina. Pt with AMS in the ED and required intubation. Pt admitted with [facility], SDH. ? ?04/29 - s/p aneurysm coiling, s/p L craniotomy for evacuation of SDH with implantation of bone flap in L abdominal wall ?04/30 - TF started ?05/01 - Cortrak placed (tip in distal stomach) ? ?Consult received for enteral nutrition initiation and management. Cortrak placed this morning. Originally plan was for extubation today but holding off per RN. ? ?Spoke with pt's sister at bedside who reports that pt had a good appetite PTA. She states that pt would "eat when she's hungry." Sister reports pt eats at least 2 meals daily. Sister shares that pt's husband passed away about 1 year ago. After this, pt did not cook as much or make as much food and may have been eating a little less. Sister states pt may have lost a few pounds but nothing significant. She is unsure of pt's UBW. No weight history available in chart. ? ?Pt's sister reports that pt has had issues for years with swallowing certain foods like steak or tough meats. Pt has never had this evaluated and does not like going to the doctor. Pt does not have issues with other foods like starches, fruits, and vegetables. ? ?Admit weight: 81.6  kg ?Current weight: 82.2 kg ? ?Patient is currently intubated on ventilator support ?MV: 8.7 L/min ?Temp (24hrs), Avg:99.3 ?F (37.4 ?C), Min:98.8 ?F (37.1 ?C), Max:99.6 ?F (37.6 ?C) ? ?Drips: ?Propofol: 14.7 ml/hr (provides 388 kcal daily from lipid) ?NS: 75 ml/hr ?Neosynephrine ? ?Medications reviewed and include: colace, SSI q 4 hours, IV protonix, IV abx, IV potassium phosphate 15 mmol once ? ?Labs reviewed: potassium 3.4, phosphorus 2.1, WBC 16.7 ?CBG's: 123-172 x 24 hours ? ?UOP: 2985 ml x 24 hours ?JP drain L scalp: 155 ml x 24 hours ?I/O's: +2.4 L since admit ? ?NUTRITION - FOCUSED PHYSICAL EXAM: ? ?Flowsheet Row Most Recent Value  ?Orbital Region No depletion  ?Upper Arm Region No depletion  ?Thoracic and Lumbar Region No depletion  ?Buccal Region Unable to assess  ?Temple Region No depletion  ?Clavicle Bone Region Mild depletion  ?Clavicle and Acromion Bone Region Mild depletion  ?Scapular Bone Region No depletion  ?Dorsal Hand No depletion  ?Patellar Region No depletion  ?Anterior Thigh Region Mild depletion  ?Posterior Calf Region No depletion  ?Edema (RD Assessment) Mild  [facial]  ?Hair Reviewed  ?Eyes Reviewed  ?Mouth Reviewed  ?Skin Reviewed  ?Nails Reviewed  ? ?  ? ? ?Diet Order:   ?Diet Order   ? ?       ?  Diet NPO time specified  Diet effective now       ?  ? ?  ?  ? ?  ? ? ?  EDUCATION NEEDS:  ? ?No education needs have been identified at this time ? ?Skin:  Skin Assessment: ?Skin Integrity Issues: ?Incisions: groin, abdomen, L head ? ?Last BM:  no documented BM ? ?Height:  ? ?Ht Readings from Last 1 Encounters:  ?08/27/21 5\' 6"  (1.676 m)  ? ? ?Weight:  ? ?Wt Readings from Last 1 Encounters:  ?08/27/21 82.2 kg  ? ? ?BMI:  Body mass index is 29.25 kg/m?. ? ?Estimated Nutritional Needs:  ? ?Kcal:  1800-2000 ? ?Protein:  105-125 grams ? ?Fluid:  1.8-2.0 L ? ? ? ?08/29/21, MS, RD, LDN ?Inpatient Clinical Dietitian ?Please see AMiON for contact information. ? ?

## 2021-08-29 NOTE — Progress Notes (Signed)
eLink Physician-Brief Progress Note ?Patient Name: Kayla Maynard ?DOB: 1956/01/26 ?MRN: [id number] ? ? ?Date of Service ? 08/29/2021  ?HPI/Events of Note ? Patient is at risk of self-extubation.  ?eICU Interventions ? Bilateral soft wrist restraints ordered.  ? ? ? ?  ? ?Frederik Pear ?08/29/2021, 5:01 AM ?

## 2021-08-29 NOTE — Progress Notes (Signed)
Transcranial Doppler ?  ?Date POD PCO2 HCT BP   MCA ACA PCA OPHT SIPH VERT Basilar  ?4/30 ?CK         Right  ?Left  ? *  ?*  ? *  ?*  ? *  ?*  ? 30  ?37  ? *  ?*  ? 11  ?16  ? *  ?   ?  ? 5/1 ?RS         Right  ?Left  ?  *  ? 69  ?  *  ?-82  ?  27  ? *  ?  19  ? 26  ?  38  ? 41  ?  *  ? *  ?  *  ?   ?  ?          Right  ?Left  ?    ?   ?    ?   ?    ?   ?    ?   ?    ?   ?    ?   ?    ?   ?  ?  ?          Right  ?Left  ?    ?   ?    ?   ?    ?   ?    ?   ?    ?   ?    ?   ?    ?   ?  ?  ?          Right  ?Left  ?    ?   ?    ?   ?    ?   ?    ?   ?    ?   ?    ?   ?    ?   ?  ?          Right  ?Left  ?    ?   ?    ?   ?    ?   ?    ?   ?    ?   ?    ?   ?    ?   ?  ?          Right  ?Left  ?    ?   ?    ?   ?    ?   ?    ?   ?    ?   ?    ?   ?    ?   ?  ?  ?*unable to insonate ? ?MCA = Middle Cerebral Artery      OPHT = Opthalmic Artery     BASILAR = Basilar Artery   ?ACA = Anterior Cerebral Artery     SIPH = Carotid Siphon ?PCA = Posterior Cerebral Artery   VERT = Verterbral Artery                  ?  ?Normal ?MCA = 62+\-12 ?ACA = 50+\-12 ?PCA = 42+\-23  ?  ?  ?

## 2021-08-29 NOTE — Progress Notes (Signed)
Transcranial Doppler ? ?Date POD PCO2 HCT BP  MCA ACA PCA OPHT SIPH VERT Basilar  ?4/30 ?CK     Right  ?Left  ? *  ?*  ? *  ?*  ? *  ?*  ? 30  ?37  ? *  ?*  ? 11  ?16  ? *  ?  ?  ?5/1,RS     Right  ?Left  ? *  ?69  ? *  ?-82  ? 27  ?*  ? 19  ?26  ? 38  ?41  ? *  ?*  ? *  ?  ?  ?     Right  ?Left  ?   ?  ?   ?  ?   ?  ?   ?  ?   ?  ?   ?  ?   ?  ?  ? ?     Right  ?Left  ?   ?  ?   ?  ?   ?  ?   ?  ?   ?  ?   ?  ?   ?  ?  ? ?     Right  ?Left  ?   ?  ?   ?  ?   ?  ?   ?  ?   ?  ?   ?  ?   ?  ?  ?     Right  ?Left  ?   ?  ?   ?  ?   ?  ?   ?  ?   ?  ?   ?  ?   ?  ?  ?     Right  ?Left  ?   ?  ?   ?  ?   ?  ?   ?  ?   ?  ?   ?  ?   ?  ?  ? ?MCA = Middle Cerebral Artery      OPHT = Opthalmic Artery     BASILAR = Basilar Artery   ?ACA = Anterior Cerebral Artery     SIPH = Carotid Siphon ?PCA = Posterior Cerebral Artery   VERT = Verterbral Artery                  ? ?Normal ?MCA = 62+\-12 ?ACA = 50+\-12 ?PCA = 42+\-23  ? ?Left Lindergaard Ratio 2.6, (*) not insonated due to poor windows. ?08/29/2021  3:35 PM ?Marilynne Halsted D ? ? ? ? ? ?

## 2021-08-29 NOTE — Progress Notes (Signed)
? ? ?Referring Physician(s): Code stroke/CCM ? ?Supervising Physician: Luanne Bras ? ?Patient Status:  [facility] - In-pt ? 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 ?Subjective: ? ?Patient seen with Dr. Estanislado Pandy, remains sedated/intubated.  ?Sister and RN at bedside, reports patient shown spontaneous movement in all 4 extremities.  ?Moves LEU/LE and RUE well, minimal movement on right UE.  ?Does not follow command, RN states that patient is weaning ventilation and sedation well.  ? ?Allergies: ?Morphine and Penicillins ? ?Medications: ?Prior to Admission medications   ?Medication Sig Start Date End Date Taking? Authorizing Provider  ?Magnesium 200 MG TABS Take 1 tablet by mouth daily.   Yes [provider]  ?nitroGLYCERIN (NITROSTAT) 0.4 MG SL tablet Place 0.4 mg under the tongue every 5 (five) minutes as needed for chest pain.   Yes [provider]  ?OVER THE COUNTER MEDICATION Take 1 tablet by mouth daily. Super Beets   Yes [provider]  ?zinc gluconate 50 MG tablet Take 50 mg by mouth daily.   Yes [provider]  ? ? ? ?Vital Signs: ?BP 120/64   Pulse 72   Temp 98.8 ?F (37.1 ?C) (Axillary)   Resp (!) 21   Ht 5\' 6"  (1.676 m)   Wt 181 lb 3.5 oz (82.2 kg)   SpO2 95%   BMI 29.25 kg/m?  ? ?Physical Exam ?Vitals and nursing note reviewed.  ?Constitutional:   ?   Comments: Sedated, intubated. Does not arouse to verbal/tactile cues or sternal rub.  ?HENT:  ?   Head:  ?   Comments: (+) left head JP with bright red blood present, numerous bandages present without blood noted ?Eyes:  ?   Comments: Swelling on left eye lid  ?Cardiovascular:  ?   Rate and Rhythm: Normal rate.  ?   Comments: (+) Right CFA puncture soft, non tender.  ?Pulmonary:  ?   Comments: On vent  ?Abdominal:  ?   Palpations: Abdomen is soft.  ?Skin: ?   General: Skin is warm and dry.  ?Neurological:  ?   Comments: Moves all 4 ext spontaneously, least movement seen on  right UE.   ? ? ? ?Imaging: ?CT ANGIO HEAD W OR WO CONTRAST ? ?Result Date: 08/27/2021 ?CLINICAL DATA:  Intracranial hemorrhage EXAM: CT ANGIOGRAPHY HEAD AND NECK TECHNIQUE: Multidetector CT imaging of the head and neck was performed using the standard protocol during bolus administration of intravenous contrast. Multiplanar CT image reconstructions and MIPs were obtained to evaluate the vascular anatomy. Carotid stenosis measurements (when applicable) are obtained utilizing NASCET criteria, using the distal internal carotid diameter as the denominator. RADIATION DOSE REDUCTION: This exam was performed according to the departmental dose-optimization program which includes automated exposure control, adjustment of the mA and/or kV according to patient size and/or use of iterative reconstruction technique. CONTRAST:  193mL OMNIPAQUE IOHEXOL 350 MG/ML SOLN COMPARISON:  CT head earlier same day FINDINGS: CT HEAD Brain: Extensive subarachnoid hemorrhage is again identified. Stable left cerebral convexity subdural hematoma. Rightward midline shift presently measures 8 mm. There is may be early trapping of the right lateral ventricle. Minimal layering intraventricular hemorrhage reflecting recirculation. Gray-white differentiation is preserved. Vascular: Better evaluated on CTA portion. Skull: Calvarium is unremarkable. Sinuses/Orbits: No acute finding. Other: None. Review of the MIP images confirms the above findings CTA NECK Aortic arch: Trace calcified plaque. Great vessel origins are patent Right carotid system: Patent. Mild calcified plaque at the bifurcation. No stenosis. Left carotid system:  Patent. Mild calcified plaque at the bifurcation and along the proximal internal carotid. No stenosis. Vertebral arteries: Patent and relatively small in caliber due to prominent posterior communicating arteries intracranially. No stenosis. Skeleton: Mild cervical spine degenerative changes. Other neck: Endotracheal and enteric  tubes are present. Upper chest: No acute abnormality. Review of the MIP images confirms the above findings CTA HEAD Aneurysm: Arising at the origin of the left posterior communicating artery, there is a posteriorly directed aneurysm. There is a narrow neck at the origin measuring about 2 mm. Aneurysm widens to about 3.4 mm and measures about 7 mm from base to apex. Anterior circulation: Intracranial internal carotid arteries are patent with trace calcified plaque. Anterior cerebral arteries are patent. 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 Anterior communicating artery is present. Middle cerebral arteries are patent. Posterior circulation: Intracranial vertebral arteries, basilar artery, and posterior cerebral arteries are patent. Bilateral posterior communicating arteries are present. Venous sinuses: Patent as allowed by contrast bolus timing. Review of the MIP images confirms the above findings IMPRESSION: Stable large volume subarachnoid hemorrhage and left cerebral convexity subdural hematoma. Rightward midline shift presently measures 8 mm with possible early trapping of the right lateral ventricle. 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 Electronically Signed   By: Macy Mis M.D.   On: 08/27/2021 18:28  ? ?DG Abd 1 View ? ?Result Date: 08/28/2021 ?CLINICAL DATA:  Check gastric catheter placement EXAM: ABDOMEN - 1 VIEW COMPARISON:  None. FINDINGS: Gastric catheter is noted extending into the distal stomach. No free air is seen. No other focal abnormality is noted. IMPRESSION: Gastric catheter within the distal stomach. Electronically Signed   By: Inez Catalina M.D.   On: 08/28/2021 01:32  ? ?CT HEAD WO CONTRAST ? ?Result Date: 08/28/2021 ?CLINICAL DATA:  66 year old female with acute subarachnoid hemorrhage and left subdural hematoma, ruptured left posterior communicating artery aneurysm. Postoperative day 1 status post endovascular coil embolization and craniotomy  and evacuation of subdural hematoma EXAM: CT HEAD WITHOUT CONTRAST TECHNIQUE: Contiguous axial images were obtained from the base of the skull through the vertex without intravenous contrast. RADIATION DOSE REDUCTION: This exam was performed according to the departmental dose-optimization program which includes automated exposure control, adjustment of the mA and/or kV according to patient size and/or use of iterative reconstruction technique. COMPARISON:  None. FINDINGS: Brain: Streak artifact left posterior communicating artery region embolization coil pack now. Status post left subdural hematoma evacuation with subdural drain in place. Largely resolved midline shift and mass effect on the lateral ventricles (series 3, image 20). Basilar cistern patency appears improved. Residual subarachnoid hemorrhage left greater than right. Small volume IVH. No significant ventriculomegaly. No acute cortically based infarct identified. Vascular: Left posterior communicating artery region embolization coil pack. Skull: New left side craniectomy. Sinuses/Orbits: Visualized paranasal sinuses and mastoids are stable and well aerated. Other: Postoperative changes to the left scalp. Skin staples in place. Negative orbits. Intubated. Fluid in the pharynx. IMPRESSION: 1. Improved status post embolization of ruptured left Pcomm aneurysm, left subdural hematoma evacuation and craniectomy. 2. Largely resolved midline shift. No significant ventriculomegaly. Small volume intraventricular hemorrhage. 3. Residual subarachnoid hemorrhage. No acute infarct identified by plain CT. Electronically Signed   By: Genevie Ann M.D.   On: 08/28/2021 05:48  ? ?CT Head Wo Contrast ? ?Result Date: 08/27/2021 ?CLINICAL DATA:  Sudden severe headache EXAM: CT HEAD WITHOUT CONTRAST CT CERVICAL SPINE WITHOUT CONTRAST TECHNIQUE: Multidetector CT imaging of the head and cervical spine was  performed following the standard protocol without intravenous contrast.  Multiplanar CT image reconstructions of the cervical spine were also generated. RADIATION DOSE REDUCTION: This exam was performed according to the departmental dose-optimization program which includes automated exposure

## 2021-08-29 NOTE — Progress Notes (Signed)
? ?NAME:  Kayla Maynard, MRN:  [id number], DOB:  1955/05/04, LOS: 2 ?ADMISSION DATE:  08/27/2021, CONSULTATION DATE:  08/27/21 ?REFERRING MD:  Wynetta Emery - NSGY, CHIEF COMPLAINT:  SAH, endotracheally intubated  ? ?History of Present Illness:  ?66 yo F PMH angina presented to [facility] 4/29 after loss of consciousness at home. Family actually started CPR per 911 advice though unclear if pulses were actually lost -- sounds like there was hypertonicity and myoclonus, more concerning for seizure. On EMS arrival pt awake, hypoglycemic, got d10, was awake ? ?In ED denied hx sz. Endorsed HA and neck pain, worse x 2 wk. Had 2 additional sz in ED. Intubated for airway protection. Found to have ICH -- SAH, SDH. Became bradycardic, Started on 3% and cleviprex  ? ?Transferred [facility] to [facility] for further care ? ?Pertinent  Medical History  ?Angina  ? ?Significant Hospital Events: ?Including procedures, antibiotic start and stop dates in addition to other pertinent events   ?4/29  multiple seizures. Hypoglycemia (factitious). Intubated. Found to have SAH, SDH. 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  Dominant left posterior communicating artery patent ?4/29 Left-sided pterional craniotomy for evacuation of left-sided subdural hematoma with implantation of the bone flap in the left abdominal wall ?4/30 considerable improvement postintervention with near complete resolution of midline shift. ? ?Interim History / Subjective:  ?She denies complaints. Weaning on propofol this morning, can follow commands as prop decreased.  Intracranial drain removed by NS this morning. Tmax 99.5. ? ?Objective   ?Blood pressure 126/67, pulse 74, temperature 98.8 ?F (37.1 ?C), temperature source Axillary, resp. rate 18, height 5\' 6"  (1.676 m), weight 82.2 kg, SpO2 96 %. ?   ?Vent Mode:  PSV;CPAP ?FiO2 (%):  [30 %-40 %] 30 % ?Set Rate:  [20 bmp] 20 bmp ?Vt Set:  [480 mL] 480 mL ?PEEP:  [5 cmH20] 5 cmH20 ?Pressure Support:  [10 cmH20] 10 cmH20 ?Plateau Pressure:  [11 cmH20-17 cmH20] 16 cmH20  ? ?Intake/Output Summary (Last 24 hours) at 08/29/2021 1046 ?Last data filed at 08/29/2021 1000 ?Gross per 24 hour  ?Intake 5635.87 ml  ?Output 2855 ml  ?Net 2780.87 ml  ? ? ?Filed Weights  ? 08/27/21 1411 08/27/21 2348  ?Weight: 81.6 kg 82.2 kg  ? ? ?Examination: ?General: critically ill appearing woman lying in bed in NAD ?HENT: Left crani- incision healing well ?Lungs: CTAB, breathing comfortably on PS 8. Minimal tracheal secretions  ?Cardiovascular: S1S2, RRR ?Abdomen:  soft, NT ?Extremities: no edema or cyanosis ?Neuro: arouses with stimulation but falls asleep quickly. Mostly keeping eyes closed, will slightly open R eye with stimulation. Moving extremities, weaker on the R. Slowly responds to commands, but able to help reposition in bed and lift head off pillow. ?GU:  foley with clear yellow urine. ? ?K+ 3.4 ?Bicarb 20 ?BUN 7 ?Cr 0.63 ?Phos 2.1 ? ?Resolved hospital problem list:  ? ? ?Assessment & Plan:  ?Acute respiratory failure with hypoxia requiring MV ?-LTVV ?-VAP prevention protocol ?-PAD protocol; limiting sedation. Mental status remains main barrier to extubation. ?-daily SAT & SBT ? ?Compression of brain due to nontraumatic subarachnoid hemorrhage (HCC)- Due to [facility] and SDH.  ?Nontraumatic subarach bleed from left posterior communicating artery (HCC) ?Subdural hematoma, acute (HCC)- ?-remains off hypertonic saline ?-drain removed today by NS; monitor neuro status ?-hopefully can stop saline infusion soon> keep for now with  stable Na+ ?-TCDs ?-nimotop; cortrak placed today to ensure we can give this med without interruption ?-con't sodium monitoring ?-goal SBP 130-150 to prevent vasospasm or rebleeding ?-minimize sedation as able; right now mental status is main barrier to extubation as she will not  sustain wakefulness when not stimulated. ?- Maintain euvolemia.  ? ?Seizure (HCC) ?-con't keppra ?-propofol PRN, not required unless repeat seizures ? ?Hypokalemia ?-repleted  ? ?At risk for malnutrition ?-con't TF ? ?Sister updated at bedside during rounds. ? ? ?Best Practice (right click and "Reselect all SmartList Selections" daily)  ? ?Diet/type: NPO Will start tube feeds ?DVT prophylaxis: prophylactic heparin   ?GI prophylaxis: PPI ?Lines: Central line and yes and it is still needed ?Foley:  Yes, and it is no longer needed and removal ordered  ?Code Status:  full code ?Last date of multidisciplinary goals of care discussion [daughter updated 5.1] ? ? ?This patient is critically ill with multiple organ system failure which requires frequent high complexity decision making, assessment, support, evaluation, and titration of therapies. This was completed through the application of advanced monitoring technologies and extensive interpretation of multiple databases. During this encounter critical care time was devoted to patient care services described in this note for 40 minutes. ? ?Steffanie Dunn, DO 08/29/21 7:11 PM ?[facility] ? ? ? ? ?

## 2021-08-29 NOTE — Progress Notes (Signed)
PT Cancellation Note ? ?Patient Details ?Name: Kayla Maynard ?MRN: [id number] ?DOB: 1955/06/02 ? ? ?Cancelled Treatment:    Reason Eval/Treat Not Completed: Active bedrest order.  ? ?Leighton Roach, PT  ?Acute Rehab Services ? Pager [phone] ?Office [phone] ? ? ? ?[place] ?08/29/2021, 9:41 AM ?

## 2021-08-29 NOTE — Progress Notes (Signed)
OT Cancellation Note ? ?Patient Details ?Name: Kayla Maynard ?MRN: [id number] ?DOB: 1956-03-28 ? ? ?Cancelled Treatment:    Reason Eval/Treat Not Completed: Active bedrest order (OT evaluation to f/u as activty orders are progressed) ? ?Eamon Tantillo A Calin Ellery ?08/29/2021, 8:08 AM ?

## 2021-08-30 HISTORY — PX: IR NEURO EACH ADD'L AFTER BASIC UNI LEFT (MS): IMG5373

## 2021-08-30 HISTORY — PX: IR ANGIO INTRA EXTRACRAN SEL INTERNAL CAROTID UNI L MOD SED: IMG5361

## 2021-08-30 HISTORY — PX: IR ANGIO VERTEBRAL SEL SUBCLAVIAN INNOMINATE UNI R MOD SED: IMG5365

## 2021-08-30 LAB — CBC WITH DIFFERENTIAL/PLATELET
Abs Immature Granulocytes: 0.09 10*3/uL — ABNORMAL HIGH (ref 0.00–0.07)
Basophils Absolute: 0 10*3/uL (ref 0.0–0.1)
Basophils Relative: 0 %
Eosinophils Absolute: 0 10*3/uL (ref 0.0–0.5)
Eosinophils Relative: 0 %
HCT: 29.8 % — ABNORMAL LOW (ref 36.0–46.0)
Hemoglobin: 9.8 g/dL — ABNORMAL LOW (ref 12.0–15.0)
Immature Granulocytes: 1 %
Lymphocytes Relative: 11 %
Lymphs Abs: 1.3 10*3/uL (ref 0.7–4.0)
MCH: 33.1 pg (ref 26.0–34.0)
MCHC: 32.9 g/dL (ref 30.0–36.0)
MCV: 100.7 fL — ABNORMAL HIGH (ref 80.0–100.0)
Monocytes Absolute: 0.8 10*3/uL (ref 0.1–1.0)
Monocytes Relative: 7 %
Neutro Abs: 9.7 10*3/uL — ABNORMAL HIGH (ref 1.7–7.7)
Neutrophils Relative %: 81 %
Platelets: 129 10*3/uL — ABNORMAL LOW (ref 150–400)
RBC: 2.96 MIL/uL — ABNORMAL LOW (ref 3.87–5.11)
RDW: 13.5 % (ref 11.5–15.5)
WBC: 12 10*3/uL — ABNORMAL HIGH (ref 4.0–10.5)
nRBC: 0 % (ref 0.0–0.2)

## 2021-08-30 LAB — COMPREHENSIVE METABOLIC PANEL
ALT: 17 U/L (ref 0–44)
AST: 17 U/L (ref 15–41)
Albumin: 2.4 g/dL — ABNORMAL LOW (ref 3.5–5.0)
Alkaline Phosphatase: 72 U/L (ref 38–126)
Anion gap: 4 — ABNORMAL LOW (ref 5–15)
BUN: 9 mg/dL (ref 8–23)
CO2: 20 mmol/L — ABNORMAL LOW (ref 22–32)
Calcium: 7.8 mg/dL — ABNORMAL LOW (ref 8.9–10.3)
Chloride: 117 mmol/L — ABNORMAL HIGH (ref 98–111)
Creatinine, Ser: 0.58 mg/dL (ref 0.44–1.00)
GFR, Estimated: >60 mL/min (ref >60)
Glucose, Bld: 164 mg/dL — ABNORMAL HIGH (ref 70–99)
Potassium: 3.3 mmol/L — ABNORMAL LOW (ref 3.5–5.1)
Sodium: 141 mmol/L (ref 135–145)
Total Bilirubin: 0.8 mg/dL (ref 0.3–1.2)
Total Protein: 4.9 g/dL — ABNORMAL LOW (ref 6.5–8.1)

## 2021-08-30 LAB — SODIUM
Sodium: 143 mmol/L (ref 135–145)
Sodium: 145 mmol/L (ref 135–145)
Sodium: 143 mmol/L (ref 135–145)

## 2021-08-30 LAB — GLUCOSE, CAPILLARY
Glucose-Capillary: 109 mg/dL — ABNORMAL HIGH (ref 70–99)
Glucose-Capillary: 133 mg/dL — ABNORMAL HIGH (ref 70–99)
Glucose-Capillary: 138 mg/dL — ABNORMAL HIGH (ref 70–99)
Glucose-Capillary: 143 mg/dL — ABNORMAL HIGH (ref 70–99)
Glucose-Capillary: 148 mg/dL — ABNORMAL HIGH (ref 70–99)
Glucose-Capillary: 151 mg/dL — ABNORMAL HIGH (ref 70–99)

## 2021-08-30 LAB — MAGNESIUM: Magnesium: 2.1 mg/dL (ref 1.7–2.4)

## 2021-08-30 LAB — PHOSPHORUS: Phosphorus: 2.3 mg/dL — ABNORMAL LOW (ref 2.5–4.6)

## 2021-08-30 MED ORDER — ORAL CARE MOUTH RINSE
15.0000 mL | Freq: Two times a day (BID) | OROMUCOSAL | Status: DC
  Administered 2021-08-30 – 2021-09-12 (×20): 15 mL via OROMUCOSAL

## 2021-08-30 MED ORDER — POTASSIUM CHLORIDE 20 MEQ PO PACK
20.0000 meq | PACK | ORAL | Status: AC
  Administered 2021-08-30 (×2): 20 meq
  Filled 2021-08-30 (×2): qty 1

## 2021-08-30 MED ORDER — NIMODIPINE 30 MG PO CAPS
60.0000 mg | ORAL_CAPSULE | ORAL | Status: DC
  Filled 2021-08-30 (×2): qty 2

## 2021-08-30 MED ORDER — PHENYLEPHRINE HCL-NACL 20-0.9 MG/250ML-% IV SOLN
0.0000 ug/min | INTRAVENOUS | Status: DC
  Administered 2021-08-30: 90 ug/min via INTRAVENOUS
  Filled 2021-08-30: qty 250

## 2021-08-30 MED ORDER — CHLORHEXIDINE GLUCONATE 0.12 % MT SOLN
15.0000 mL | Freq: Two times a day (BID) | OROMUCOSAL | Status: DC
  Administered 2021-08-31 – 2021-09-12 (×18): 15 mL via OROMUCOSAL
  Filled 2021-08-30 (×15): qty 15

## 2021-08-30 MED ORDER — POTASSIUM CHLORIDE 10 MEQ/50ML IV SOLN
10.0000 meq | INTRAVENOUS | Status: AC
  Administered 2021-08-30 (×4): 10 meq via INTRAVENOUS
  Filled 2021-08-30 (×4): qty 50

## 2021-08-30 MED ORDER — INSULIN ASPART 100 UNIT/ML IJ SOLN
2.0000 [IU] | INTRAMUSCULAR | Status: DC
  Administered 2021-08-30 – 2021-08-31 (×6): 2 [IU] via SUBCUTANEOUS
  Administered 2021-08-31: 4 [IU] via SUBCUTANEOUS
  Administered 2021-08-31 (×2): 2 [IU] via SUBCUTANEOUS
  Administered 2021-08-31 – 2021-09-01 (×3): 4 [IU] via SUBCUTANEOUS
  Administered 2021-09-01 – 2021-09-02 (×9): 2 [IU] via SUBCUTANEOUS
  Administered 2021-09-02: 4 [IU] via SUBCUTANEOUS
  Administered 2021-09-03: 2 [IU] via SUBCUTANEOUS
  Administered 2021-09-03 (×2): 4 [IU] via SUBCUTANEOUS
  Administered 2021-09-04: 2 [IU] via SUBCUTANEOUS
  Administered 2021-09-04: 4 [IU] via SUBCUTANEOUS
  Administered 2021-09-04 – 2021-09-05 (×2): 2 [IU] via SUBCUTANEOUS
  Administered 2021-09-05 – 2021-09-06 (×3): 4 [IU] via SUBCUTANEOUS
  Administered 2021-09-06 – 2021-09-07 (×5): 2 [IU] via SUBCUTANEOUS
  Administered 2021-09-07: 6 [IU] via SUBCUTANEOUS
  Administered 2021-09-08: 2 [IU] via SUBCUTANEOUS
  Administered 2021-09-08: 4 [IU] via SUBCUTANEOUS
  Administered 2021-09-08 – 2021-09-12 (×11): 2 [IU] via SUBCUTANEOUS

## 2021-08-30 MED ORDER — NIMODIPINE 6 MG/ML PO SOLN
60.0000 mg | ORAL | Status: DC
  Administered 2021-08-30 – 2021-09-12 (×80): 60 mg
  Filled 2021-08-30 (×81): qty 10

## 2021-08-30 NOTE — Evaluation (Signed)
Occupational Therapy Evaluation ?Patient Details ?Name: Kayla Maynard ?MRN: [id number] ?DOB: 1955/09/04 ?Today's Date: 08/30/2021 ? ? ?History of Present Illness Ms. Kayla Maynard is a 66 y/o woman who presented with LOC and was found to have seizures x3 in the ED. CT showed extensive subarachnoid hemorrhage bilaterally and large SDH L frontal/ parietal/ temporal. 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  PMH: angina  ? ?Clinical Impression ?  ?Kayla Maynard was evaluated s/p the above admission list, she is indep at baseline and has supportive family who can assist 24/7 at d/c if needed. Upon evaluation pt reported 9/10 pain and was lethargic, RN notified. However once sitting EOB with max A she became more alert, interactive and followed all simple commands with increased time.  Overall she required min A +2 for simple transfers and mod A +2 for pivotal stepping. Due to pain, weakness, impaired balance and decreased activity tolerance she requires max A for LB ADLs and up to mod A for UB ADLs. Pt will benefit from OT acutely to address the limitations listed below. Recommend AIR at d/c for multidisciplinary approach to maximum functional recovery.  ?   ? ?Recommendations for follow up therapy are one component of a multi-disciplinary discharge planning process, led by the attending physician.  Recommendations may be updated based on patient status, additional functional criteria and insurance authorization.  ? ?Follow Up Recommendations ? Acute inpatient rehab (3hours/day)  ?  ?Assistance Recommended at Discharge Frequent or constant Supervision/Assistance  ?Patient can return home with the following A lot of help with walking and/or transfers;A lot of help with bathing/dressing/bathroom;Assistance with cooking/housework;Assistance with feeding;Direct supervision/assist for medications management;Direct supervision/assist for financial management;Assist for transportation;Help with stairs or ramp  for entrance ? ?  ?Functional Status Assessment ? Patient has had a recent decline in their functional status and demonstrates the ability to make significant improvements in function in a reasonable and predictable amount of time.  ?Equipment Recommendations ? Other (comment) (pending progression)  ?  ?Recommendations for Other Services Rehab consult ? ? ?  ?Precautions / Restrictions Precautions ?Precautions: Fall ?Precaution Comments: L crani, bone flap in L abdominal wall ?Restrictions ?Weight Bearing Restrictions: No  ? ?  ? ?Mobility Bed Mobility ?Overal bed mobility: Needs Assistance ?Bed Mobility: Rolling, Sidelying to Sit ?Rolling: Min assist ?Sidelying to sit: Max assist ?  ?  ?  ?  ?  ? ?Transfers ?Overall transfer level: Needs assistance ?Equipment used: 2 person hand held assist ?Transfers: Sit to/from Stand, Bed to chair/wheelchair/BSC ?Sit to Stand: Min assist, +2 physical assistance, +2 safety/equipment ?  ?  ?Step pivot transfers: Mod assist, +2 physical assistance, +2 safety/equipment ?  ?  ?General transfer comment: cues for sequencing and safety. pt soot 4x this session ?  ? ?  ?Balance Overall balance assessment: Needs assistance ?Sitting-balance support: Feet supported ?Sitting balance-Leahy Scale: Fair ?  ?  ?Standing balance support: Bilateral upper extremity supported, During functional activity ?Standing balance-Leahy Scale: Poor ?  ?  ?  ?  ?  ?  ?  ?  ?  ?  ?  ?  ?   ? ?ADL either performed or assessed with clinical judgement  ? ?ADL Overall ADL's : Needs assistance/impaired ?Eating/Feeding: NPO ?  ?Grooming: Sitting;Moderate assistance ?  ?Upper Body Bathing: Sitting;Moderate assistance ?  ?Lower Body Bathing: Maximal assistance;+2 for physical assistance;+2 for safety/equipment;Sit to/from stand ?  ?Upper Body Dressing : Moderate assistance;Sitting ?  ?Lower Body Dressing: Maximal assistance;+2 for physical assistance;+2 for  safety/equipment ?  ?Toilet Transfer: Moderate assistance;+2  for physical assistance;Stand-pivot;BSC/3in1 ?  ?Toileting- Clothing Manipulation and Hygiene: Maximal assistance;+2 for safety/equipment;+2 for physical assistance ?Toileting - Clothing Manipulation Details (indicate cue type and reason): pt incontinent upon standing, max A +2 for rear peri care in standing ?  ?  ?Functional mobility during ADLs: Moderate assistance;+2 for physical assistance;+2 for safety/equipment ?General ADL Comments: requries cues for safety and positioning throughout. slow and deliberate for all funcitonal tasks  ? ? ? ?Vision Baseline Vision/History: 0 No visual deficits ?Ability to See in Adequate Light: 0 Adequate ?Vision Assessment?: Vision impaired- to be further tested in functional context ?Additional Comments: needs to be further tested  ?   ?Perception   ?  ?Praxis   ?  ? ?Pertinent Vitals/Pain Pain Assessment ?Pain Assessment: 0-10 ?Pain Score: 9  ?Pain Location: head/face ?Pain Descriptors / Indicators: Discomfort, Grimacing, Guarding ?Pain Intervention(s): Patient requesting pain meds-RN notified, Limited activity within patient's tolerance, Monitored during session  ? ? ? ?Hand Dominance Right ?  ?Extremity/Trunk Assessment Upper Extremity Assessment ?Upper Extremity Assessment: RUE deficits/detail;LUE deficits/detail ?RUE Deficits / Details: generally weak, full AAROM available, slow and deliberate movements ?RUE Coordination: decreased fine motor;decreased gross motor ?LUE Deficits / Details: generally weak, full AAROM available, slow and deliberate movements ?LUE Sensation: WNL ?LUE Coordination: decreased fine motor;decreased gross motor ?  ?Lower Extremity Assessment ?Lower Extremity Assessment: Defer to PT evaluation ?  ?Cervical / Trunk Assessment ?Cervical / Trunk Assessment: Other exceptions ?Cervical / Trunk Exceptions: crani & bone flap in abdomen ?  ?Communication Communication ?Communication: Expressive difficulties (minimal verbalizations, soft & effortful speech  for simple words) ?  ?Cognition Arousal/Alertness: Awake/alert, Lethargic ?Behavior During Therapy: Flat affect ?Overall Cognitive Status: Impaired/Different from baseline ?Area of Impairment: Attention, Following commands, Memory, Safety/judgement, Awareness, Problem solving ?  ?  ?  ?  ?  ?  ?  ?  ?  ?Current Attention Level: Sustained ?Memory: Decreased recall of precautions ?Following Commands: Follows one step commands consistently ?Safety/Judgement: Decreased awareness of safety, Decreased awareness of deficits ?Awareness: Emergent ?Problem Solving: Slow processing, Requires verbal cues ?General Comments: pt a bit impulsive for mobilizing. Requires step by step cues fro safety. follows most commands with increased item. ?  ?  ?General Comments  VSS on RA, family present and supportive ? ?  ?Exercises   ?  ?Shoulder Instructions    ? ? ?Home Living Family/patient expects to be discharged to:: Private residence ?Living Arrangements: Alone ?Available Help at Discharge: Family;Available 24 hours/day ?Type of Home: House ?Home Access: Stairs to enter ?Entrance Stairs-Number of Steps: 4 ?Entrance Stairs-Rails: None ?Home Layout: Two level ?Alternate Level Stairs-Number of Steps: 3 ?Alternate Level Stairs-Rails: Right ?Bathroom Shower/Tub: Tub/shower unit (walk in tub) ?  ?Bathroom Toilet: Standard ?  ?  ?Home Equipment: Grab bars - tub/shower;Hand held shower head ?  ?Additional Comments: good family support, on disability for back pain ?  ? ?  ?Prior Functioning/Environment Prior Level of Function : Independent/Modified Independent;Driving ?  ?  ?  ?  ?  ?  ?Mobility Comments: independent ?  ?  ? ?  ?  ?OT Problem List: Decreased strength;Decreased range of motion;Decreased activity tolerance;Impaired balance (sitting and/or standing);Decreased cognition;Decreased safety awareness;Decreased knowledge of precautions;Decreased knowledge of use of DME or AE;Pain ?  ?   ?OT Treatment/Interventions: Self-care/ADL  training;Neuromuscular education;Therapeutic exercise;Balance training;Patient/family education;Cognitive remediation/compensation;Therapeutic activities;DME and/or AE instruction  ?  ?OT Goals(Current goals ca

## 2021-08-30 NOTE — Progress Notes (Signed)
OT Cancellation Note ? ?Patient Details ?Name: Kayla Maynard ?MRN: [id number] ?DOB: 1955-05-09 ? ? ?Cancelled Treatment:    Reason Eval/Treat Not Completed: Active bedrest order (OT evaluation to f/u as activity orders are progressed.) ? ?Jadene Stemmer A Blaize Nipper ?08/30/2021, 8:02 AM ?

## 2021-08-30 NOTE — Progress Notes (Signed)
SLP Cancellation Note ? ?Patient Details ?Name: Kayla Maynard ?MRN: [id number] ?DOB: 01/11/1956 ? ? ?Cancelled treatment:       Reason Eval/Treat Not Completed: Patient not medically ready. Remains on vent. Will continue to follow.  ? ? ? ?Mahala Menghini., M.A. CCC-SLP ?Acute Rehabilitation Services ?Office [phone] ? ?Secure chat preferred ? ?08/30/2021, 7:18 AM ?

## 2021-08-30 NOTE — Progress Notes (Signed)
Clarified BP parameters with Dr. Conchita Paris. Normotensive and SBP<160 ok if patient neuro status is unchanged. ?

## 2021-08-30 NOTE — Progress Notes (Signed)
[facility] ADULT ICU REPLACEMENT PROTOCOL ? ? ?The patient does apply for the [facility] Adult ICU Electrolyte Replacment Protocol based on the criteria listed below:  ? ?1.Exclusion criteria: TCTS patients, ECMO patients, and Dialysis patients ?2. Is GFR >/= 30 ml/min? Yes.    ?Patient's GFR today is >60 ?3. Is SCr </= 2? Yes.   ?Patient's SCr is 0.58 mg/dL ?4. Did SCr increase >/= 0.5 in 24 hours? No. ?5.Pt's weight >40kg  Yes.   ?6. Abnormal electrolyte(s): K 3.3  ?7. Electrolytes replaced per protocol ?8.  Call MD STAT for K+ </= 2.5, Phos </= 1, or Mag </= 1 ?Physician:  Lucile Shutters ? ?[facility], Cheng Dec A 08/30/2021 6:02 AM ? ?

## 2021-08-30 NOTE — Procedures (Signed)
Extubation Procedure Note ? ?Patient Details:   ?Name: Kayla Maynard ?DOB: 12/05/1955 ?MRN: [id number] ?  ?Airway Documentation:  ?Airway 7.5 mm (Active)  ?Secured at (cm) 23 cm 08/30/21 0809  ?Measured From Lips 08/30/21 0809  ?Secured Location Center 08/30/21 0809  ?Secured By [employer] 08/30/21 0809  ?Tube Holder Repositioned Yes 08/30/21 0809  ?Prone position No 08/30/21 0809  ?Cuff Pressure (cm H2O) Clear OR 27-39 CmH2O 08/30/21 0809  ?Site Condition Dry 08/30/21 0809  ? ?Vent end date: (not recorded) Vent end time: (not recorded)  ? ?Evaluation ? O2 sats: stable throughout ?Complications: No apparent complications ?Patient did tolerate procedure well. ?Bilateral Breath Sounds: Clear, Diminished ?  ?Yes ?Patient extubated to 4 L [place] sat 96%, no labored breathing noted at this time. Patient able to say name, no stridor noted, positive cuff leak. Attempted to instruct patient on IS, patient unable to do at this time. Will attempt again. RT will continue to monitor.  ? ?[name] ?08/30/2021, 9:21 AM ? ?

## 2021-08-30 NOTE — Progress Notes (Signed)
? ? ?Referring Physician(s): ?Dr Chestine Spore ? ?Supervising Physician: Julieanne Cotton ? ?Patient Status:  [facility] - In-pt ? ?Chief Complaint: ? 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 ?Subjective: ? ?Still intubated this am ?Much more alert today ?Plans for extubation per Dr Chestine Spore today ?Following most commands ?Family at bedside ? ?Allergies: ?Morphine and Penicillins ? ?Medications: ?Prior to Admission medications   ?Medication Sig Start Date End Date Taking? Authorizing Provider  ?Magnesium 200 MG TABS Take 1 tablet by mouth daily.   Yes [provider]  ?nitroGLYCERIN (NITROSTAT) 0.4 MG SL tablet Place 0.4 mg under the tongue every 5 (five) minutes as needed for chest pain.   Yes [provider]  ?OVER THE COUNTER MEDICATION Take 1 tablet by mouth daily. Super Beets   Yes [provider]  ?zinc gluconate 50 MG tablet Take 50 mg by mouth daily.   Yes [provider]  ? ? ? ?Vital Signs: ?BP (!) 95/50   Pulse 61   Temp 98.5 ?F (36.9 ?C) (Axillary)   Resp 18   Ht 5\' 6"  (1.676 m)   Wt 194 lb 0.1 oz (88 kg)   SpO2 95%   BMI 31.31 kg/m?  ? ?Physical Exam ?Vitals reviewed.  ?Constitutional:   ?   Comments: Intubated   ?Eyes:  ?   Extraocular Movements: Extraocular movements intact.  ?Musculoskeletal:  ?   Comments: Able to move all 4s to command  ?Skin: ?   General: Skin is warm.  ?Neurological:  ?   Mental Status: She is alert.  ?   Comments: Exam with Dr -- improving daily  ? ? ?Imaging: ?CT ANGIO HEAD W OR WO CONTRAST ? ?Result Date: 08/27/2021 ?CLINICAL DATA:  Intracranial hemorrhage EXAM: CT ANGIOGRAPHY HEAD AND NECK TECHNIQUE: Multidetector CT imaging of the head and neck was performed using the standard protocol during bolus administration of intravenous contrast. Multiplanar CT image reconstructions and MIPs were obtained to evaluate the vascular anatomy. Carotid stenosis measurements (when applicable) are obtained utilizing NASCET criteria,  using the distal internal carotid diameter as the denominator. RADIATION DOSE REDUCTION: This exam was performed according to the departmental dose-optimization program which includes automated exposure control, adjustment of the mA and/or kV according to patient size and/or use of iterative reconstruction technique. CONTRAST:  08/29/2021 OMNIPAQUE IOHEXOL 350 MG/ML SOLN COMPARISON:  CT head earlier same day FINDINGS: CT HEAD Brain: Extensive subarachnoid hemorrhage is again identified. Stable left cerebral convexity subdural hematoma. Rightward midline shift presently measures 8 mm. There is may be early trapping of the right lateral ventricle. Minimal layering intraventricular hemorrhage reflecting recirculation. Gray-white differentiation is preserved. Vascular: Better evaluated on CTA portion. Skull: Calvarium is unremarkable. Sinuses/Orbits: No acute finding. Other: None. Review of the MIP images confirms the above findings CTA NECK Aortic arch: Trace calcified plaque. Great vessel origins are patent Right carotid system: Patent. Mild calcified plaque at the bifurcation. No stenosis. Left carotid system: Patent. Mild calcified plaque at the bifurcation and along the proximal internal carotid. No stenosis. Vertebral arteries: Patent and relatively small in caliber due to prominent posterior communicating arteries intracranially. No stenosis. Skeleton: Mild cervical spine degenerative changes. Other neck: Endotracheal and enteric tubes are present. Upper chest: No acute abnormality. Review of the MIP images confirms the above findings CTA HEAD Aneurysm: Arising at the origin of the left posterior communicating artery, there is a posteriorly directed aneurysm. There is a narrow neck at the origin measuring about 2 mm. Aneurysm widens to  about 3.4 mm and measures about 7 mm from base to apex. Anterior circulation: Intracranial internal carotid arteries are patent with trace calcified plaque. Anterior cerebral arteries  are patent. 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 Anterior communicating artery is present. Middle cerebral arteries are patent. Posterior circulation: Intracranial vertebral arteries, basilar artery, and posterior cerebral arteries are patent. Bilateral posterior communicating arteries are present. Venous sinuses: Patent as allowed by contrast bolus timing. Review of the MIP images confirms the above findings IMPRESSION: Stable large volume subarachnoid hemorrhage and left cerebral convexity subdural hematoma. Rightward midline shift presently measures 8 mm with possible early trapping of the right lateral ventricle. 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 Electronically Signed   By: Guadlupe SpanishPraneil  Patel M.D.   On: 08/27/2021 18:28  ? ?DG Abd 1 View ? ?Result Date: 08/28/2021 ?CLINICAL DATA:  Check gastric catheter placement EXAM: ABDOMEN - 1 VIEW COMPARISON:  None. FINDINGS: Gastric catheter is noted extending into the distal stomach. No free air is seen. No other focal abnormality is noted. IMPRESSION: Gastric catheter within the distal stomach. Electronically Signed   By: Alcide CleverMark  Lukens M.D.   On: 08/28/2021 01:32  ? ?CT HEAD WO CONTRAST ? ?Result Date: 08/28/2021 ?CLINICAL DATA:  66 year old female with acute subarachnoid hemorrhage and left subdural hematoma, ruptured left posterior communicating artery aneurysm. Postoperative day 1 status post endovascular coil embolization and craniotomy and evacuation of subdural hematoma EXAM: CT HEAD WITHOUT CONTRAST TECHNIQUE: Contiguous axial images were obtained from the base of the skull through the vertex without intravenous contrast. RADIATION DOSE REDUCTION: This exam was performed according to the departmental dose-optimization program which includes automated exposure control, adjustment of the mA and/or kV according to patient size and/or use of iterative reconstruction technique. COMPARISON:  None. FINDINGS: Brain: Streak  artifact left posterior communicating artery region embolization coil pack now. Status post left subdural hematoma evacuation with subdural drain in place. Largely resolved midline shift and mass effect on the lateral ventricles (series 3, image 20). Basilar cistern patency appears improved. Residual subarachnoid hemorrhage left greater than right. Small volume IVH. No significant ventriculomegaly. No acute cortically based infarct identified. Vascular: Left posterior communicating artery region embolization coil pack. Skull: New left side craniectomy. Sinuses/Orbits: Visualized paranasal sinuses and mastoids are stable and well aerated. Other: Postoperative changes to the left scalp. Skin staples in place. Negative orbits. Intubated. Fluid in the pharynx. IMPRESSION: 1. Improved status post embolization of ruptured left Pcomm aneurysm, left subdural hematoma evacuation and craniectomy. 2. Largely resolved midline shift. No significant ventriculomegaly. Small volume intraventricular hemorrhage. 3. Residual subarachnoid hemorrhage. No acute infarct identified by plain CT. Electronically Signed   By: Odessa FlemingH  Hall M.D.   On: 08/28/2021 05:48  ? ?CT Head Wo Contrast ? ?Result Date: 08/27/2021 ?CLINICAL DATA:  Sudden severe headache EXAM: CT HEAD WITHOUT CONTRAST CT CERVICAL SPINE WITHOUT CONTRAST TECHNIQUE: Multidetector CT imaging of the head and cervical spine was performed following the standard protocol without intravenous contrast. Multiplanar CT image reconstructions of the cervical spine were also generated. RADIATION DOSE REDUCTION: This exam was performed according to the departmental dose-optimization program which includes automated exposure control, adjustment of the mA and/or kV according to patient size and/or use of iterative reconstruction technique. COMPARISON:  None FINDINGS: CT HEAD FINDINGS Brain: Abnormal exam. Extensive subarachnoid hemorrhage at basilar cisterns, interhemispheric fissure, BILATERAL  sylvian fissures greater on LEFT. Additional large acute subdural hematoma at LEFT frontal, parietal and temporal regions measuring up to 14  mm thick at the frontal parietal region. Large collection of extra-axial high att

## 2021-08-30 NOTE — Progress Notes (Signed)
Inpatient Rehab Admissions Coordinator:  ? ?Per therapy recommendations,  patient was screened for CIR candidacy by Vicenta Olds, MS, CCC-SLP. At this time, Pt. Appears to be a a potential candidate for CIR. I will place   order for rehab consult per protocol for full assessment. Please contact me any with questions. ? ?Sheniya Garciaperez, MS, CCC-SLP ?Rehab Admissions Coordinator  ?[phone] (celll) ?[phone] (office) ? ?

## 2021-08-30 NOTE — Progress Notes (Signed)
?  NEUROSURGERY PROGRESS NOTE  ? ?Pt seen and examined. No issues overnight. Extubated this am. C/o HA today ? ?EXAM: ?Temp:  [98.4 ?F (36.9 ?C)-101 ?F (38.3 ?C)] 98.4 ?F (36.9 ?C) (05/02 1200) ?Pulse Rate:  [53-79] 54 (05/02 1415) ?Resp:  [14-25] 17 (05/02 1415) ?BP: (88-140)/(48-79) 123/66 (05/02 1415) ?SpO2:  [90 %-99 %] 91 % (05/02 1415) ?Arterial Line BP: (90-160)/(45-70) 96/70 (05/02 1030) ?FiO2 (%):  [30 %] 30 % (05/02 0809) ?Weight:  [88 kg] 88 kg (05/02 0500) ?Intake/Output   ?   05/01 0701 ?05/02 0700 05/02 0701 ?05/03 0700  ? I.V. (mL/kg) 2288.2 (26) 272.2 (3.1)  ? NG/GT 1008.3 82  ? IV Piggyback 654 51.2  ? Total Intake(mL/kg) 3950.6 (44.9) 405.4 (4.6)  ? Urine (mL/kg/hr) 2250 (1.1)   ? Drains    ? Stool 0   ? Total Output 2250   ? Net +1700.6 +405.4  ?     ? Urine Occurrence 1 x   ? Stool Occurrence 1 x 1 x  ?  ?Awake, alert, oriented ?Hoarse but fluent speech ?Good strength throughout, minimal RUE drift ?Wounds c/d/i ? ?LABS: ?Lab Results  ?Component Value Date  ? CREATININE 0.58 08/30/2021  ? BUN 9 08/30/2021  ? NA 145 08/30/2021  ? K 3.3 (L) 08/30/2021  ? CL 117 (H) 08/30/2021  ? CO2 20 (L) 08/30/2021  ? ?Lab Results  ?Component Value Date  ? WBC 12.0 (H) 08/30/2021  ? HGB 9.8 (L) 08/30/2021  ? HCT 29.8 (L) 08/30/2021  ? MCV 100.7 (H) 08/30/2021  ? PLT 129 (L) 08/30/2021  ? ? ?IMPRESSION: ?- 66 y.o. female SAH d# 4 s/p coil embo L PCom aneurysm, neurologically improving now with nearly non-focal exam. ?- SZ provoked by [facility] ? ?PLAN: ?- Cont ICU observation ?- Can wean vasopressors and monitor neurologic exam ?- Cont Nimotop ?- TCD tomorrow ?- Cont Keppra ?- PT/OT/SLP evals ? ? ?Lisbeth Renshaw, MD ?[facility]  ? ?

## 2021-08-30 NOTE — Evaluation (Signed)
Physical Therapy Evaluation ?Patient Details ?Name: Kayla Maynard ?MRN: [id number] ?DOB: 1955/10/12 ?Today's Date: 08/30/2021 ? ?History of Present Illness ? Kayla Maynard is a 66 y/o woman who presented with LOC and was found to have seizures x3 in the ED. CT showed extensive subarachnoid hemorrhage bilaterally and large SDH L frontal/ parietal/ temporal. 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  PMH: angina, back surgery  ?Clinical Impression ? Pt admitted with above diagnosis. Pt is from home where she lived alone and was independent and driving. She has family nearby and her son and sister state that she could have 24/7 assist if needed. Pt lethargic on eval but able to arouse when stimulated. Needed max A to come to EOB with specific vc's for sequencing. Pt was able to perform sit<>stand multiple times with mod A +2 and pivot to chair with mod HHA +2. Pt had difficulty sequencing steps and needed specific vc's for this as well. Expect good improvement as she is able to tolerate more mobility. Recommend AIR at this time to return to independence.  Pt currently with functional limitations due to the deficits listed below (see PT Problem List). Pt will benefit from skilled PT to increase their independence and safety with mobility to allow discharge to the venue listed below.   ?   ?   ? ?Recommendations for follow up therapy are one component of a multi-disciplinary discharge planning process, led by the attending physician.  Recommendations may be updated based on patient status, additional functional criteria and insurance authorization. ? ?Follow Up Recommendations Acute inpatient rehab (3hours/day) ? ?  ?Assistance Recommended at Discharge Frequent or constant Supervision/Assistance  ?Patient can return home with the following ? A lot of help with walking and/or transfers;A lot of help with bathing/dressing/bathroom;Assistance with cooking/housework;Assistance with feeding;Direct  supervision/assist for medications management;Direct supervision/assist for financial management;Assist for transportation;Help with stairs or ramp for entrance ? ?  ?Equipment Recommendations Other (comment) (TBD)  ?Recommendations for Other Services ? Rehab consult  ?  ?Functional Status Assessment Patient has had a recent decline in their functional status and demonstrates the ability to make significant improvements in function in a reasonable and predictable amount of time.  ? ?  ?Precautions / Restrictions Precautions ?Precautions: Fall ?Precaution Comments: L crani, bone flap in L abdominal wall ?Restrictions ?Weight Bearing Restrictions: No  ? ?  ? ?Mobility ? Bed Mobility ?Overal bed mobility: Needs Assistance ?Bed Mobility: Rolling, Sidelying to Sit ?Rolling: Min assist ?Sidelying to sit: Max assist ?  ?  ?  ?General bed mobility comments: needed vc's for sequencing and max A for LE's off bed and elevation of trunk into sitting ?  ? ?Transfers ?Overall transfer level: Needs assistance ?Equipment used: 2 person hand held assist ?Transfers: Sit to/from Stand, Bed to chair/wheelchair/BSC ?Sit to Stand: Min assist, +2 physical assistance, +2 safety/equipment ?  ?Step pivot transfers: Mod assist, +2 physical assistance, +2 safety/equipment ?  ?  ?  ?General transfer comment: At first took small, shuffling, ineffective steps. Improved with specific vc's for stepping. sit<>stand 4x this session ?  ? ?Ambulation/Gait ?  ?  ?  ?  ?  ?  ?  ?General Gait Details: deferred at thie time due to lethargy and fatigue with standing ? ?Stairs ?  ?  ?  ?  ?  ? ?Wheelchair Mobility ?  ? ?Modified Rankin (Stroke Patients Only) ?Modified Rankin (Stroke Patients Only) ?Pre-Morbid Rankin Score: No symptoms ?Modified Rankin: Moderately severe disability ? ?  ? ?  Balance Overall balance assessment: Needs assistance ?Sitting-balance support: Feet supported ?Sitting balance-Leahy Scale: Fair ?  ?  ?Standing balance support: Bilateral  upper extremity supported, During functional activity ?Standing balance-Leahy Scale: Poor ?  ?  ?  ?  ?  ?  ?  ?  ?  ?  ?  ?  ?   ? ? ? ?Pertinent Vitals/Pain Pain Assessment ?Pain Assessment: 0-10 ?Pain Score: 9  ?Pain Location: head/face ?Pain Descriptors / Indicators: Discomfort, Grimacing, Guarding ?Pain Intervention(s): Patient requesting pain meds-RN notified, Limited activity within patient's tolerance, Monitored during session  ? ? ?Home Living Family/patient expects to be discharged to:: Private residence ?Living Arrangements: Alone ?Available Help at Discharge: Family;Available 24 hours/day ?Type of Home: House ?Home Access: Stairs to enter ?Entrance Stairs-Rails: None ?Entrance Stairs-Number of Steps: 4 ?Alternate Level Stairs-Number of Steps: 3 ?Home Layout: Two level ?Home Equipment: Grab bars - tub/shower;Hand held shower head ?Additional Comments: good family support, on disability for back pain  ?  ?Prior Function Prior Level of Function : Independent/Modified Independent;Driving ?  ?  ?  ?  ?  ?  ?Mobility Comments: independent ?  ?  ? ? ?Hand Dominance  ? Dominant Hand: Right ? ?  ?Extremity/Trunk Assessment  ? Upper Extremity Assessment ?Upper Extremity Assessment: Defer to OT evaluation ?RUE Deficits / Details: generally weak, full AAROM available, slow and deliberate movements ?RUE Coordination: decreased fine motor;decreased gross motor ?LUE Deficits / Details: generally weak, full AAROM available, slow and deliberate movements ?LUE Sensation: WNL ?LUE Coordination: decreased fine motor;decreased gross motor ?  ? ?Lower Extremity Assessment ?Lower Extremity Assessment: Generalized weakness ?  ? ?Cervical / Trunk Assessment ?Cervical / Trunk Assessment: Other exceptions ?Cervical / Trunk Exceptions: crani & bone flap in abdomen  ?Communication  ? Communication: Expressive difficulties (minimal verbalizations, soft & effortful speech for simple words)  ?Cognition Arousal/Alertness: Awake/alert,  Lethargic ?Behavior During Therapy: Flat affect ?Overall Cognitive Status: Impaired/Different from baseline ?Area of Impairment: Attention, Following commands, Memory, Safety/judgement, Awareness, Problem solving ?  ?  ?  ?  ?  ?  ?  ?  ?  ?Current Attention Level: Sustained ?Memory: Decreased recall of precautions ?Following Commands: Follows one step commands consistently ?Safety/Judgement: Decreased awareness of safety, Decreased awareness of deficits ?Awareness: Emergent ?Problem Solving: Slow processing, Requires verbal cues ?General Comments: pt a bit impulsive for mobilizing. Requires step by step cues for safety. follows most commands with increased time. Fell asleep whenever not actively stimulated ?  ?  ? ?  ?General Comments General comments (skin integrity, edema, etc.): pt incontinent of stool in bed, was not aware. Cleaned up with initial standing. Sister and son present on eval. VSS on RA ? ?  ?Exercises    ? ?Assessment/Plan  ?  ?PT Assessment Patient needs continued PT services  ?PT Problem List Decreased strength;Decreased activity tolerance;Decreased balance;Decreased mobility;Decreased coordination;Decreased cognition;Decreased knowledge of use of DME;Decreased safety awareness;Decreased knowledge of precautions;Pain ? ?   ?  ?PT Treatment Interventions DME instruction;Gait training;Stair training;Functional mobility training;Therapeutic activities;Therapeutic exercise;Balance training;Neuromuscular re-education;Cognitive remediation;Patient/family education;Wheelchair mobility training   ? ?PT Goals (Current goals can be found in the Care Plan section)  ?Acute Rehab PT Goals ?Patient Stated Goal: return home ?PT Goal Formulation: With patient ?Time For Goal Achievement: 09/13/21 ?Potential to Achieve Goals: Good ? ?  ?Frequency Min 4X/week ?  ? ? ?Co-evaluation PT/OT/SLP Co-Evaluation/Treatment: Yes ?Reason for Co-Treatment: Complexity of the patient's impairments (multi-system  involvement);Necessary to address cognition/behavior during functional activity;For patient/therapist safety ?  PT goals addressed during session: Mobility/safety with mobility;Balance ?OT goals addressed during session: A

## 2021-08-30 NOTE — Progress Notes (Signed)
? ?NAME:  Kayla Maynard, MRN:  [id number], DOB:  July 30, 1955, LOS: 3 ?ADMISSION DATE:  08/27/2021, CONSULTATION DATE:  08/27/21 ?REFERRING MD:  Wynetta Emery - NSGY, CHIEF COMPLAINT:  SAH, endotracheally intubated  ? ?History of Present Illness:  ?66 yo F PMH angina presented to [facility] 4/29 after loss of consciousness at home. Family actually started CPR per 911 advice though unclear if pulses were actually lost -- sounds like there was hypertonicity and myoclonus, more concerning for seizure. On EMS arrival pt awake, hypoglycemic, got d10, was awake ? ?In ED denied hx sz. Endorsed HA and neck pain, worse x 2 wk. Had 2 additional sz in ED. Intubated for airway protection. Found to have ICH -- SAH, SDH. Became bradycardic, Started on 3% and cleviprex  ? ?Transferred [facility] to [facility] for further care ? ?Pertinent  Medical History  ?Angina  ? ?Significant Hospital Events: ?Including procedures, antibiotic start and stop dates in addition to other pertinent events   ?4/29  multiple seizures. Hypoglycemia (factitious). Intubated. Found to have SAH, SDH. 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  Dominant left posterior communicating artery patent ?4/29 Left-sided pterional craniotomy for evacuation of left-sided subdural hematoma with implantation of the bone flap in the left abdominal wall ?4/30 considerable improvement postintervention with near complete resolution of midline shift. ?5/1 weaned 11hrs but still too sleepy for extubation, Intracranial drain removed by NS this morning. Tmax 99.5. ? ?Interim History / Subjective:  ?Aline dampened reading higher than cuff  ?More awake this am ?Tmax 101 last evening, WBC down today  ? ?Objective   ?Blood pressure (!) 95/50, pulse 61, temperature 98.5 ?F (36.9 ?C), temperature source Axillary, resp. rate 18,  height 5\' 6"  (1.676 m), weight 88 kg, SpO2 95 %. ?   ?Vent Mode: PSV;CPAP ?FiO2 (%):  [30 %] 30 % ?Set Rate:  [20 bmp] 20 bmp ?Vt Set:  [480 mL] 480 mL ?PEEP:  [5 cmH20] 5 cmH20 ?Pressure Support:  [5 cmH20] 5 cmH20 ?Plateau Pressure:  [14 cmH20-17 cmH20] 17 cmH20  ? ?Intake/Output Summary (Last 24 hours) at 08/30/2021 0858 ?Last data filed at 08/30/2021 0700 ?Gross per 24 hour  ?Intake 3664.59 ml  ?Output 2250 ml  ?Net 1414.59 ml  ? ?Filed Weights  ? 08/27/21 1411 08/27/21 2348 08/30/21 0500  ?Weight: 81.6 kg 82.2 kg 88 kg  ? ?Examination: ?Off sedation ?General:  critically ill older female lying in bed in NAD ?HEENT: MM pink/moist, left crani dressing CDI, pupils 3/reactive, some ecchymosis to left eye, cortrak, ETT ?Neuro:  Awake, f/c, MAE 5/5 ?CV: rr, NSR/ SB, no murmur ?PULM:  non labored on PSV 5/5, CTA, able to lift head off pillow ?GI: soft, bs+, NT ?Extremities: warm/dry, no LE edema  ?Skin: no rashes ? ?Tmax 101 overnight  ?Unmeasured urinary occurrences overnight ? ?Labs K 3.3, Cl 117, bicarb 20, sCr 0.58, WBC 16.7> 12, Hgb 10.8> 9.8, plts 185> 129 ?Pending phos/ mag ? ?Resolved hospital problem list:  ? ? ?Assessment & Plan:  ?Acute respiratory failure with hypoxia requiring MV ?- weaning well PSV 5/5 today and mental status much improved> plan for extubation today ?- NPO x 4 hours and assess swallow vs SLP  ?- wean O2 for sats > 94% ?- aggressive pulm hygiene- IS/ mobilize as able  ? ?Compression of brain due to nontraumatic subarachnoid hemorrhage (HCC)- Due to  SAH and SDH.  ?Nontraumatic subarach bleed from left posterior communicating artery (HCC) ?Subdural hematoma, acute (HCC)- ?-drain removed 5/1 by N ?- cont to closely monitor neuro status, high risk for vasospasms  ?- SBP goal 130-150, Neo as needed  ?- remove aline>  dampened ?- trending TCDs ?- cont nimotop, increasing today now off Neo ?- cont NS 75 ml/hr. Monitor volume status closely, I/Os ? ?Seizure (HCC) ?- con't keppra ?- seizure  precautions ?- Maintain neuro protective measures; goal for eurothermia, euglycemia, eunatermia, normoxia, and PCO2 goal of 35-40 ?- Nutrition and bowel regiment - cortrak for now ?- Aspirations precautions  ? ?Hypokalemia ?Hypophos ?- replete as needed.  Check mag  ? ?At risk for malnutrition ?-con't TF per cortrak and assess swallowing as able  ? ?Fever ?- no obvious source, ?central.  WBC decreasing ?- monitor clinically for now ? ?Macrocytic anemia  ?- check B12, TSH, folate  ?- trend on CBC ? ?Son updated at bedside 5/2. ? ?Best Practice (right click and "Reselect all SmartList Selections" daily)  ? ?Diet/type: NPO TF per cortrak for now pending swallow eval post extubation ?DVT prophylaxis: prophylactic heparin   ?GI prophylaxis: PPI ?Lines: Central line and yes and it is still needed ?Foley:  N/A ?Code Status:  full code ?Last date of multidisciplinary goals of care discussion ? ?CCT:  35 mins ? ? ?Posey Boyer, ACNP ?[facility] ?08/30/2021, 8:59 AM ? ?See Amion for pager ?If no response to pager, please call PCCM consult pager ?After 7:00 pm call Elink   ? ? ? ? ?

## 2021-08-31 ENCOUNTER — Inpatient Hospital Stay (HOSPITAL_COMMUNITY): Payer: Medicare Other

## 2021-08-31 DIAGNOSIS — G935 Compression of brain: Secondary | ICD-10-CM

## 2021-08-31 DIAGNOSIS — R410 Disorientation, unspecified: Secondary | ICD-10-CM

## 2021-08-31 DIAGNOSIS — I609 Nontraumatic subarachnoid hemorrhage, unspecified: Secondary | ICD-10-CM

## 2021-08-31 DIAGNOSIS — G4089 Other seizures: Secondary | ICD-10-CM | POA: Diagnosis not present

## 2021-08-31 DIAGNOSIS — R443 Hallucinations, unspecified: Secondary | ICD-10-CM

## 2021-08-31 LAB — COMPREHENSIVE METABOLIC PANEL
ALT: 20 U/L (ref 0–44)
AST: 20 U/L (ref 15–41)
Albumin: 2.4 g/dL — ABNORMAL LOW (ref 3.5–5.0)
Alkaline Phosphatase: 94 U/L (ref 38–126)
Anion gap: 5 (ref 5–15)
BUN: 11 mg/dL (ref 8–23)
CO2: 22 mmol/L (ref 22–32)
Calcium: 8.3 mg/dL — ABNORMAL LOW (ref 8.9–10.3)
Chloride: 116 mmol/L — ABNORMAL HIGH (ref 98–111)
Creatinine, Ser: 0.59 mg/dL (ref 0.44–1.00)
GFR, Estimated: >60 mL/min (ref >60)
Glucose, Bld: 130 mg/dL — ABNORMAL HIGH (ref 70–99)
Potassium: 3.5 mmol/L (ref 3.5–5.1)
Sodium: 143 mmol/L (ref 135–145)
Total Bilirubin: 0.5 mg/dL (ref 0.3–1.2)
Total Protein: 5.2 g/dL — ABNORMAL LOW (ref 6.5–8.1)

## 2021-08-31 LAB — CBC WITH DIFFERENTIAL/PLATELET
Abs Immature Granulocytes: 0.12 10*3/uL — ABNORMAL HIGH (ref 0.00–0.07)
Basophils Absolute: 0 10*3/uL (ref 0.0–0.1)
Basophils Relative: 0 %
Eosinophils Absolute: 0 10*3/uL (ref 0.0–0.5)
Eosinophils Relative: 0 %
HCT: 31.1 % — ABNORMAL LOW (ref 36.0–46.0)
Hemoglobin: 10 g/dL — ABNORMAL LOW (ref 12.0–15.0)
Immature Granulocytes: 1 %
Lymphocytes Relative: 11 %
Lymphs Abs: 1.6 10*3/uL (ref 0.7–4.0)
MCH: 32.4 pg (ref 26.0–34.0)
MCHC: 32.2 g/dL (ref 30.0–36.0)
MCV: 100.6 fL — ABNORMAL HIGH (ref 80.0–100.0)
Monocytes Absolute: 0.7 10*3/uL (ref 0.1–1.0)
Monocytes Relative: 5 %
Neutro Abs: 11.9 10*3/uL — ABNORMAL HIGH (ref 1.7–7.7)
Neutrophils Relative %: 83 %
Platelets: 152 10*3/uL (ref 150–400)
RBC: 3.09 MIL/uL — ABNORMAL LOW (ref 3.87–5.11)
RDW: 13.3 % (ref 11.5–15.5)
WBC: 14.3 10*3/uL — ABNORMAL HIGH (ref 4.0–10.5)
nRBC: 0 % (ref 0.0–0.2)

## 2021-08-31 LAB — GLUCOSE, CAPILLARY
Glucose-Capillary: 130 mg/dL — ABNORMAL HIGH (ref 70–99)
Glucose-Capillary: 127 mg/dL — ABNORMAL HIGH (ref 70–99)
Glucose-Capillary: 153 mg/dL — ABNORMAL HIGH (ref 70–99)
Glucose-Capillary: 132 mg/dL — ABNORMAL HIGH (ref 70–99)
Glucose-Capillary: 156 mg/dL — ABNORMAL HIGH (ref 70–99)

## 2021-08-31 LAB — PHOSPHORUS: Phosphorus: 2.3 mg/dL — ABNORMAL LOW (ref 2.5–4.6)

## 2021-08-31 LAB — FOLATE: Folate: 6.8 ng/mL (ref >5.9)

## 2021-08-31 LAB — SODIUM: Sodium: 144 mmol/L (ref 135–145)

## 2021-08-31 LAB — TSH: TSH: 0.954 u[IU]/mL (ref 0.350–4.500)

## 2021-08-31 MED ORDER — DEXTROSE 5 % IV SOLN
15.0000 mmol | Freq: Once | INTRAVENOUS | Status: AC
  Administered 2021-08-31: 15 mmol via INTRAVENOUS
  Filled 2021-08-31: qty 5

## 2021-08-31 MED ORDER — MAGNESIUM SULFATE 2 GM/50ML IV SOLN
2.0000 g | Freq: Once | INTRAVENOUS | Status: AC
  Administered 2021-08-31: 2 g via INTRAVENOUS
  Filled 2021-08-31: qty 50

## 2021-08-31 MED ORDER — ACETAMINOPHEN 325 MG PO TABS
650.0000 mg | ORAL_TABLET | ORAL | Status: DC | PRN
  Administered 2021-08-31 – 2021-09-10 (×10): 650 mg
  Filled 2021-08-31 (×13): qty 2

## 2021-08-31 MED ORDER — ACETAMINOPHEN 160 MG/5ML PO SOLN
650.0000 mg | ORAL | Status: DC | PRN
  Administered 2021-08-31 – 2021-09-07 (×11): 650 mg
  Filled 2021-08-31 (×12): qty 20.3

## 2021-08-31 MED ORDER — NUTRISOURCE FIBER PO PACK
1.0000 | PACK | Freq: Two times a day (BID) | ORAL | Status: DC
  Administered 2021-08-31 – 2021-09-12 (×25): 1
  Filled 2021-08-31 (×25): qty 1

## 2021-08-31 NOTE — Evaluation (Signed)
Clinical/Bedside Swallow Evaluation ?Patient Details  ?Name: Kayla Maynard ?MRN: [id number] ?Date of Birth: [birth year] ? ?Today's Date: 08/31/2021 ?Time: SLP Start Time (ACUTE ONLY): 1040 SLP Stop Time (ACUTE ONLY): 1100 ?SLP Time Calculation (min) (ACUTE ONLY): 20 min ? ?Past Medical History:  ?Past Medical History:  ?Diagnosis Date  ? Angina at rest [facility])   ? ?Past Surgical History:  ?Past Surgical History:  ?Procedure Laterality Date  ? CRANIOTOMY Left 08/27/2021  ? Procedure: CRANIOTOMY FOR EVACUATION OF SUBDURAL HEMATOMA WITH PLACEMENT OF BONE FLAP IN ABDOMEN;  Surgeon: Donalee Citrin, MD;  Location: [facility] OR;  Service: Neurosurgery;  Laterality: Left;  ? IR ANGIO INTRA EXTRACRAN SEL COM CAROTID INNOMINATE UNI R MOD SED  08/27/2021  ? IR ANGIO INTRA EXTRACRAN SEL INTERNAL CAROTID UNI L MOD SED  08/30/2021  ? IR ANGIO VERTEBRAL SEL SUBCLAVIAN INNOMINATE UNI R MOD SED  08/30/2021  ? IR ANGIOGRAM FOLLOW UP STUDY  08/27/2021  ? IR ANGIOGRAM FOLLOW UP STUDY  08/27/2021  ? IR ANGIOGRAM FOLLOW UP STUDY  08/27/2021  ? IR ANGIOGRAM FOLLOW UP STUDY  08/27/2021  ? IR ANGIOGRAM FOLLOW UP STUDY  08/27/2021  ? IR ANGIOGRAM FOLLOW UP STUDY  08/27/2021  ? IR ANGIOGRAM FOLLOW UP STUDY  08/27/2021  ? IR CT HEAD LTD  08/27/2021  ? IR NEURO EACH ADD'L AFTER BASIC UNI LEFT (MS)  08/30/2021  ? IR TRANSCATH/EMBOLIZ  08/27/2021  ? RADIOLOGY WITH ANESTHESIA N/A 08/27/2021  ? Procedure: RADIOLOGY WITH ANESTHESIA;  Surgeon: Julieanne Cotton, MD;  Location: [facility] OR;  Service: Radiology;  Laterality: N/A;  ? STENT PLACEMENT VASCULAR ([facility] HX)    ? ?HPI:  ?Kayla Maynard is a 66 y/o woman who presented with LOC and was found to have seizures in the ED. On head CT she was found to have [facility] & SDH. She had a L PICA aneurysm coiled and underwent L pterional craniotomy for evacuation of SDH, bone flap in abdomen. Intubated from 4/28-5/2. Failed [facility].  ?  ?Assessment / Plan / Recommendation  ?Clinical Impression ? Pt is alert but reluctant to take much PO. She reports  dizziness. SLP able to position upright and assist in pt in taking small sips. She would not take any consecutive large volume sips to truly assess the potential for silent aspiration. However, pts vocal quality was clear and she exhibited no signs of oropharyngeal neuromuscular impairment and is now 64 hours s/p extubation. Pt refused any puree or solids. SLP will initiate a full liquid diet and f/u for diet advancement. ?SLP Visit Diagnosis: Dysphagia, unspecified (R13.10) ?   ?Aspiration Risk ? Mild aspiration risk  ?  ?Diet Recommendation Thin liquid  ? ?Liquid Administration via: Cup;Straw ?Medication Administration: Via alternative means ?Compensations: Slow rate;Small sips/bites ?Postural Changes: Seated upright at 90 degrees  ?  ?Other  Recommendations Oral Care Recommendations: Oral care BID   ? ?Recommendations for follow up therapy are one component of a multi-disciplinary discharge planning process, led by the attending physician.  Recommendations may be updated based on patient status, additional functional criteria and insurance authorization. ? ?Follow up Recommendations Acute inpatient rehab (3hours/day)  ? ? ?  ?Assistance Recommended at Discharge Frequent or constant Supervision/Assistance  ?Functional Status Assessment Patient has had a recent decline in their functional status and demonstrates the ability to make significant improvements in function in a reasonable and predictable amount of time.  ?Frequency and Duration    ?  ?  ?   ? ?Prognosis    ? ?  ? ?  Swallow Study   ?General HPI: Kayla Maynard is a 66 y/o woman who presented with LOC and was found to have seizures in the ED. On head CT she was found to have [facility] & SDH. She had a L PICA aneurysm coiled and underwent L pterional craniotomy for evacuation of SDH, bone flap in abdomen. Intubated from 4/28-5/2. Failed [facility]. ?Type of Study: Bedside Swallow Evaluation ?Diet Prior to this Study: NPO ?Temperature Spikes Noted: No ?History of Recent  Intubation: Yes ?Length of Intubations (days): 5 days ?Date extubated: 08/30/21 ?Behavior/Cognition: Alert ?Oral Cavity Assessment: Within Functional Limits ?Oral Care Completed by SLP: No ?Vision: Functional for self-feeding ?Self-Feeding Abilities: Needs assist ?Patient Positioning: Upright in bed ?Baseline Vocal Quality: Normal ?Volitional Cough: Strong ?Volitional Swallow: Able to elicit  ?  ?Oral/Motor/Sensory Function Overall Oral Motor/Sensory Function: Within functional limits   ?Ice Chips Ice chips: Within functional limits   ?Thin Liquid Thin Liquid: Within functional limits ?Presentation: Straw;Cup  ?  ?Nectar Thick Nectar Thick Liquid: Not tested   ?Honey Thick Honey Thick Liquid: Not tested   ?Puree Puree: Not tested   ?Solid ? ? ?  Solid: Not tested  ? ?  ? ?Tymel Conely, Riley Nearing ?08/31/2021,1:14 PM ? ? ? ?

## 2021-08-31 NOTE — PMR Pre-admission (Signed)
PMR Admission Coordinator Pre-Admission Assessment ? ?Patient: Kayla Maynard is an 66 y.o., female ?MRN: [id number] ?DOB: 06-Apr-1956 ?Height: 5\' 6"  (167.6 cm) ?Weight: 88 kg ? ?Insurance Information ?HMO:     PPO:      PCP:      IPA:      80/20:      OTHER:  ?PRIMARY: Medicare       Policy#: [id number]       Subscriber: Pt. ?Phone#: Verified online    Fax#:  ?Pre-Cert#:       Employer:  ?Benefits:  Phone #:      Name:  ?Eff. Date: Parts A effective 05/02/2003 and B effective 07/30/2004 Deduct: $1600      Out of Pocket Max:  None      Life Max: N/A  ?CIR: 100%      SNF: 100 days ?Outpatient: 80%     Co-Pay: 20% ?Home Health: 100%      Co-Pay: none ?DME: 80%     Co-Pay: 20% ?Providers: patient's choice ?SECONDARY:       Policy#:      Phone#:  ? ?Financial Counselor:       Phone#:  ? ?The ?Data Collection Information Summary? for patients in Inpatient Rehabilitation Facilities with attached ?Privacy Act [place] Records? was provided and verbally reviewed with: N/A ? ?Emergency Contact Information ?Contact Information   ? ? Name Relation Home Work Mobile  ? curry,byron Son   [phone]  ? Burr Medico Sister   [phone]  ? ?  ? ? ?Current Medical History  ?Patient Admitting Diagnosis: Seizure, aneurysm ?History of Present Illness: Pt is a 66 yo F PMH angina presented to [facility] 4/29 after loss of consciousness at home. Family actually started CPR per 911 advice though unclear if pulses were actually lost. It was felt that pt likely had a seizure. On EMS arrival pt awake, hypoglycemic, got d10, was awake.  In ED denied hx sz. Endorsed HA and neck pain, worse x 2 wk. Had 2 additional sz in ED. Intubated for airway protection. Found to have ICH -- SAH, SDH. Became bradycardic, Started on 3% and cleviprex. Pt. transferred [facility] for further care. 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 Pt. Also underwent left-sided pterional craniotomy for evacuation of left-sided subdural hematoma with implantation of the bone flap in the left abdominal wall. Scans on 4/30 revealed considerable improvement postintervention with near complete resolution of midline shift. Intracranial drain removed 5/1 and pt. Was extubated 5/2. Pt. Seen by PT, OT, and SLP who recommended CIR to assist return to PLOF.  ? ?Complete NIHSS TOTAL: 3 ? ?Patient's medical record from [facility] has been reviewed by the rehabilitation admission coordinator and physician. ? ?Past Medical History  ?Past Medical History:  ?Diagnosis Date  ? Angina at rest [facility])   ? ? ?Has the patient had major surgery during 100 days prior to admission? Yes ? ?Family History   ?family history is not on file. ? ?Current Medications ? ?Current Facility-Administered Medications:  ?  Place/Maintain arterial line, , , Until Discontinued **AND** 0.9 %  sodium chloride infusion, , Intra-arterial, PRN, Kary Kos, MD ?  0.9 %  sodium chloride infusion, , Intravenous, PRN, Kary Kos, MD, Stopped at 08/28/21 1209 ?  0.9 %  sodium chloride infusion, , Intravenous, Continuous, Kipp Brood, MD,  Last Rate: 75 mL/hr at 08/31/21 1400, Infusion Verify at 08/31/21 1400 ?  acetaminophen (TYLENOL) tablet 650 mg, 650 mg, Per Tube, Q4H PRN, 650 mg at 08/31/21 1145 **OR** acetaminophen (TYLENOL) 160 MG/5ML solution 650 mg, 650 mg, Per Tube, Q4H PRN **OR** [DISCONTINUED] acetaminophen (TYLENOL) suppository 650 mg, 650 mg, Rectal, Q4H PRN, Deveshwar, Sanjeev, MD ?  chlorhexidine (PERIDEX) 0.12 % solution 15 mL, 15 mL, Mouth Rinse, BID, Noemi Chapel P, DO, 15 mL at 08/31/21 0955 ?  Chlorhexidine Gluconate Cloth 2 % PADS 6 each, 6 each, Topical, Q0600, Kary Kos, MD, 6 each at 08/30/21 2101 ?  feeding supplement (PROSource TF) liquid 45 mL, 45 mL, Per Tube, TID, Noemi Chapel P, DO, 45 mL at 08/31/21  0955 ?  feeding supplement (VITAL 1.5 CAL) liquid 1,000 mL, 1,000 mL, Per Tube, Continuous, Julian Hy, DO, Last Rate: 50 mL/hr at 08/30/21 2038, 1,000 mL at 08/30/21 2038 ?  fiber (NUTRISOURCE FIBER) 1 packet, 1 packet, Per Tube, BID, Jennelle Human B, NP, 1 packet at 08/31/21 1145 ?  heparin injection 5,000 Units, 5,000 Units, Subcutaneous, Q8H, Agarwala, Ravi, MD, 5,000 Units at 08/31/21 1400 ?  HYDROmorphone (DILAUDID) injection 0.25 mg, 0.25 mg, Intravenous, Q2H PRN, Agarwala, Ravi, MD, 0.25 mg at 08/30/21 1248 ?  insulin aspart (novoLOG) injection 2-6 Units, 2-6 Units, Subcutaneous, Q4H, Ogan, Okoronkwo U, MD, 4 Units at 08/31/21 1154 ?  labetalol (NORMODYNE) injection 10-40 mg, 10-40 mg, Intravenous, Q10 min PRN, Kary Kos, MD, 20 mg at 08/29/21 1749 ?  levETIRAcetam (KEPPRA) IVPB 1000 mg/100 mL premix, 1,000 mg, Intravenous, Q12H, Kary Kos, MD, Stopped at 08/31/21 [phone] ?  MEDLINE mouth rinse, 15 mL, Mouth Rinse, q12n4p, Noemi Chapel P, DO, 15 mL at 08/31/21 1148 ?  niMODipine (NIMOTOP) capsule 60 mg, 60 mg, Oral, Q4H **OR** niMODipine (NYMALIZE) 6 MG/ML oral solution 60 mg, 60 mg, Per Tube, Q4H, Noemi Chapel P, DO, 60 mg at 08/31/21 1145 ?  ondansetron (ZOFRAN) tablet 4 mg, 4 mg, Oral, Q4H PRN **OR** ondansetron (ZOFRAN) injection 4 mg, 4 mg, Intravenous, Q4H PRN, Kary Kos, MD, 4 mg at 08/30/21 1312 ?  potassium PHOSPHATE 15 mmol in dextrose 5 % 250 mL infusion, 15 mmol, Intravenous, Once, Carlis Abbott, Laura P, DO ?  promethazine (PHENERGAN) tablet 12.5-25 mg, 12.5-25 mg, Oral, Q4H PRN, Kary Kos, MD ? ?Patients Current Diet:  ?Diet Order   ? ?       ?  Diet full liquid Room service appropriate? Yes; Fluid consistency: Thin  Diet effective now       ?  ? ?  ?  ? ?  ? ? ?Precautions / Restrictions ?Precautions ?Precautions: Fall ?Precaution Comments: L crani, bone flap in L abdominal wall ?Restrictions ?Weight Bearing Restrictions: No  ? ?Has the patient had 2 or more falls or a fall with injury in the  past year? Yes ? ?Prior Activity Level ?Community (5-7x/wk): Pt. was active in the community PTA ? ?Prior Functional Level ?Self Care: Did the patient need help bathing, dressing, using the toilet or eating? Independent ? ?Indoor Mobility: Did the patient need assistance with walking from room to room (with or without device)? Independent ? ?Stairs: Did the patient need assistance with internal or external stairs (with or without device)? Independent ? ?Functional Cognition: Did the patient need help planning regular tasks such as shopping or remembering to take medications? Independent ? ?Patient Information ?Are you of Hispanic, Latino/a,or Spanish origin?: A. No, not of Hispanic, Latino/a, or Spanish origin (  Information obtained via proxy) ?What is your race?: A. White ?Do you need or want an interpreter to communicate with a doctor or health care staff?: 0. No ? ?Patient's Response To:  ?Health Literacy and Transportation ?Is the patient able to respond to health literacy and transportation needs?: No ? ?Home Assistive Devices / Equipment ?Home Equipment: Grab bars - tub/shower, Hand held shower head ? ?Prior Device Use: Indicate devices/aids used by the patient prior to current illness, exacerbation or injury? None of the above ? ?Current Functional Level ?Cognition ? Overall Cognitive Status: Impaired/Different from baseline ?Current Attention Level: Sustained ?Orientation Level: Oriented to person, Oriented to place, Oriented to time, Disoriented to situation ?Following Commands: Follows one step commands consistently ?Safety/Judgement: Decreased awareness of safety, Decreased awareness of deficits ?General Comments: pt a bit impulsive for mobilizing. Requires step by step cues for safety. follows most commands with increased time. Fell asleep whenever not actively stimulated ?   ?Extremity Assessment ?(includes Sensation/Coordination) ? Upper Extremity Assessment: Defer to OT evaluation ?RUE Deficits /  Details: generally weak, full AAROM available, slow and deliberate movements ?RUE Coordination: decreased fine motor, decreased gross motor ?LUE Deficits / Details: generally weak, full AAROM available, slow a

## 2021-08-31 NOTE — Progress Notes (Signed)
Transcranial Doppler ? ?Date POD PCO2 HCT BP  MCA ACA PCA OPHT SIPH VERT Basilar  ?4/30 ?CK     Right  ?Left  ? *  ?*  ? *  ?*  ? *  ?*  ? 30  ?37  ? *  ?*  ? 11  ?16  ? *  ?  ?  ?5/1,RS     Right  ?Left  ? *  ?69  ? *  ?-82  ? 27  ?*  ? 19  ?26  ? 38  ?41  ? *  ?*  ? *  ?  ?  ?5/3,rs     Right  ?Left  ? 108  ?95  ? -53  ?-35  ? *  ?*  ? 29  ?34  ? 50  ?53  ? -32  ?-23  ? *  ?  ?  ? ?     Right  ?Left  ?   ?  ?   ?  ?   ?  ?   ?  ?   ?  ?   ?  ?   ?  ?  ? ?     Right  ?Left  ?   ?  ?   ?  ?   ?  ?   ?  ?   ?  ?   ?  ?   ?  ?  ?     Right  ?Left  ?   ?  ?   ?  ?   ?  ?   ?  ?   ?  ?   ?  ?   ?  ?  ?     Right  ?Left  ?   ?  ?   ?  ?   ?  ?   ?  ?   ?  ?   ?  ?   ?  ?  ? ?MCA = Middle Cerebral Artery      OPHT = Opthalmic Artery     BASILAR = Basilar Artery   ?ACA = Anterior Cerebral Artery     SIPH = Carotid Siphon ?PCA = Posterior Cerebral Artery   VERT = Verterbral Artery                  ? ?Normal ?MCA = 62+\-12 ?ACA = 50+\-12 ?PCA = 42+\-23  ? ?Right Lindergaard Ratio 3.7, Left Lindergaard Ratio 2.3  (*) not insonated due to poor windows. ?08/31/2021  11:36 AM ?Marilynne Halsted D ? ? ? ? ? ?

## 2021-08-31 NOTE — Progress Notes (Signed)
Spoke with RN Maralyn Sago who reports that she is aware of the DC central line order ? ?

## 2021-08-31 NOTE — Progress Notes (Signed)
?  Transition of Care (TOC) Screening Note ? ? ?Patient Details  ?Name: Kayla Maynard ?Date of Birth: [birth year] ? ? ?Transition of Care (TOC) CM/SW Contact:    ?Mearl Latin, LCSW ?Phone Number: ?08/31/2021, 5:32 PM ? ? ? ?Transition of Care Department [facility]) has reviewed patient and no TOC needs have been identified at this time. We will continue to monitor patient advancement through interdisciplinary progression rounds. If new patient transition needs arise, please place a TOC consult. ? ? ?

## 2021-08-31 NOTE — Progress Notes (Signed)
? ? ?Referring Physician(s): Code stroke/CCM ? ?Supervising Physician: Luanne Bras ? ?Patient Status:  [facility] - In-pt ? 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 ?Subjective: ? ?Patient seen with Dr. Estanislado Pandy, remains sedated but now extubated.  ?Son at bedside, states that she showed purposeful movement in all 4 extremities.  ?Patient opens eyes and able to communicate and answer questions. ?Son reports that she complained headache and dizziness, inform the son that those symptoms are very common in patients who has SAH/SDH.  ? ?Allergies: ?Morphine and Penicillins ? ?Medications: ?Prior to Admission medications   ?Medication Sig Start Date End Date Taking? Authorizing Provider  ?Magnesium 200 MG TABS Take 1 tablet by mouth daily.   Yes [provider]  ?nitroGLYCERIN (NITROSTAT) 0.4 MG SL tablet Place 0.4 mg under the tongue every 5 (five) minutes as needed for chest pain.   Yes [provider]  ?OVER THE COUNTER MEDICATION Take 1 tablet by mouth daily. Super Beets   Yes [provider]  ?zinc gluconate 50 MG tablet Take 50 mg by mouth daily.   Yes [provider]  ? ? ? ?Vital Signs: ?BP 135/65   Pulse 66   Temp 98.6 ?F (37 ?C) (Oral)   Resp (!) 22   Ht 5\' 6"  (1.676 m)   Wt 194 lb 0.1 oz (88 kg)   SpO2 96%   BMI 31.31 kg/m?  ? ?Physical Exam ?Vitals and nursing note reviewed.  ?Constitutional:   ?   Comments: Sedated, but able to carry short conversation and answer questions.   ?HENT:  ?   Head:  ?   Comments: (+) bandages present on left head ?Eyes:  ?   Comments: No swelling on left eye lid noted   ?Cardiovascular:  ?   Rate and Rhythm: Normal rate.  ?   Comments: (+) Right CFA puncture soft, non tender. DP 2+ bilaterally  ?Pulmonary:  ?   Effort: Pulmonary effort is normal.  ?Abdominal:  ?   General: Abdomen is flat.  ?   Palpations: Abdomen is soft.  ?Skin: ?   General: Skin is warm and dry.  ?Neurological:  ?   Comments:  Moves all 4 ext purposefully, least movement seen on right UE.  ?Able to states where she is, what month is, and read the clock on the wall correctly.   ? ? ? ?Imaging: ?CT ANGIO HEAD W OR WO CONTRAST ? ?Result Date: 08/27/2021 ?CLINICAL DATA:  Intracranial hemorrhage EXAM: CT ANGIOGRAPHY HEAD AND NECK TECHNIQUE: Multidetector CT imaging of the head and neck was performed using the standard protocol during bolus administration of intravenous contrast. Multiplanar CT image reconstructions and MIPs were obtained to evaluate the vascular anatomy. Carotid stenosis measurements (when applicable) are obtained utilizing NASCET criteria, using the distal internal carotid diameter as the denominator. RADIATION DOSE REDUCTION: This exam was performed according to the departmental dose-optimization program which includes automated exposure control, adjustment of the mA and/or kV according to patient size and/or use of iterative reconstruction technique. CONTRAST:  156mL OMNIPAQUE IOHEXOL 350 MG/ML SOLN COMPARISON:  CT head earlier same day FINDINGS: CT HEAD Brain: Extensive subarachnoid hemorrhage is again identified. Stable left cerebral convexity subdural hematoma. Rightward midline shift presently measures 8 mm. There is may be early trapping of the right lateral ventricle. Minimal layering intraventricular hemorrhage reflecting recirculation. Gray-white differentiation is preserved. Vascular: Better evaluated on CTA portion. Skull: Calvarium is unremarkable. Sinuses/Orbits: No acute finding. Other: None. Review of  the MIP images confirms the above findings CTA NECK Aortic arch: Trace calcified plaque. Great vessel origins are patent Right carotid system: Patent. Mild calcified plaque at the bifurcation. No stenosis. Left carotid system: Patent. Mild calcified plaque at the bifurcation and along the proximal internal carotid. No stenosis. Vertebral arteries: Patent and relatively small in caliber due to prominent posterior  communicating arteries intracranially. No stenosis. Skeleton: Mild cervical spine degenerative changes. Other neck: Endotracheal and enteric tubes are present. Upper chest: No acute abnormality. Review of the MIP images confirms the above findings CTA HEAD Aneurysm: Arising at the origin of the left posterior communicating artery, there is a posteriorly directed aneurysm. There is a narrow neck at the origin measuring about 2 mm. Aneurysm widens to about 3.4 mm and measures about 7 mm from base to apex. Anterior circulation: Intracranial internal carotid arteries are patent with trace calcified plaque. Anterior cerebral arteries are patent. 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 Anterior communicating artery is present. Middle cerebral arteries are patent. Posterior circulation: Intracranial vertebral arteries, basilar artery, and posterior cerebral arteries are patent. Bilateral posterior communicating arteries are present. Venous sinuses: Patent as allowed by contrast bolus timing. Review of the MIP images confirms the above findings IMPRESSION: Stable large volume subarachnoid hemorrhage and left cerebral convexity subdural hematoma. Rightward midline shift presently measures 8 mm with possible early trapping of the right lateral ventricle. 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 Electronically Signed   By: Macy Mis M.D.   On: 08/27/2021 18:28  ? ?DG Abd 1 View ? ?Result Date: 08/28/2021 ?CLINICAL DATA:  Check gastric catheter placement EXAM: ABDOMEN - 1 VIEW COMPARISON:  None. FINDINGS: Gastric catheter is noted extending into the distal stomach. No free air is seen. No other focal abnormality is noted. IMPRESSION: Gastric catheter within the distal stomach. Electronically Signed   By: Inez Catalina M.D.   On: 08/28/2021 01:32  ? ?CT HEAD WO CONTRAST ? ?Result Date: 08/28/2021 ?CLINICAL DATA:  66 year old female with acute subarachnoid hemorrhage and left subdural  hematoma, ruptured left posterior communicating artery aneurysm. Postoperative day 1 status post endovascular coil embolization and craniotomy and evacuation of subdural hematoma EXAM: CT HEAD WITHOUT CONTRAST TECHNIQUE: Contiguous axial images were obtained from the base of the skull through the vertex without intravenous contrast. RADIATION DOSE REDUCTION: This exam was performed according to the departmental dose-optimization program which includes automated exposure control, adjustment of the mA and/or kV according to patient size and/or use of iterative reconstruction technique. COMPARISON:  None. FINDINGS: Brain: Streak artifact left posterior communicating artery region embolization coil pack now. Status post left subdural hematoma evacuation with subdural drain in place. Largely resolved midline shift and mass effect on the lateral ventricles (series 3, image 20). Basilar cistern patency appears improved. Residual subarachnoid hemorrhage left greater than right. Small volume IVH. No significant ventriculomegaly. No acute cortically based infarct identified. Vascular: Left posterior communicating artery region embolization coil pack. Skull: New left side craniectomy. Sinuses/Orbits: Visualized paranasal sinuses and mastoids are stable and well aerated. Other: Postoperative changes to the left scalp. Skin staples in place. Negative orbits. Intubated. Fluid in the pharynx. IMPRESSION: 1. Improved status post embolization of ruptured left Pcomm aneurysm, left subdural hematoma evacuation and craniectomy. 2. Largely resolved midline shift. No significant ventriculomegaly. Small volume intraventricular hemorrhage. 3. Residual subarachnoid hemorrhage. No acute infarct identified by plain CT. Electronically Signed   By: Genevie Ann M.D.   On: 08/28/2021 05:48  ? ?CT  Head Wo Contrast ? ?Result Date: 08/27/2021 ?CLINICAL DATA:  Sudden severe headache EXAM: CT HEAD WITHOUT CONTRAST CT CERVICAL SPINE WITHOUT CONTRAST  TECHNIQUE: Multidetector CT imaging of the head and cervical spine was performed following the standard protocol without intravenous contrast. Multiplanar CT image reconstructions of the cervical spine were also generated.

## 2021-08-31 NOTE — Progress Notes (Signed)
RN went into room after patient called out complaining off dry mouth. I had swabbed her mouth once this am. Son informed me that he gave her water to drink. I educated him on the concern for aspiration and pneumonia. He stated "she won't get pneumonia and I'll take responsibility if she does." MD outside the room aware and she spoke with the family member. I assured them speech would see her today and he should not do that again.  ? ?Champayne Kocian, [place] Scrape, RN   ?

## 2021-08-31 NOTE — Progress Notes (Signed)
?  NEUROSURGERY PROGRESS NOTE  ? ?Pt seen and examined. No issues overnight. Minimal HA today but c/o dizziness. ? ?EXAM: ?Temp:  [98.3 ?F (36.8 ?C)-99.7 ?F (37.6 ?C)] 99.7 ?F (37.6 ?C) (05/03 1600) ?Pulse Rate:  [54-86] 66 (05/03 1500) ?Resp:  [18-32] 19 (05/03 1500) ?BP: (117-150)/(51-96) 130/65 (05/03 1500) ?SpO2:  [90 %-99 %] 97 % (05/03 1500) ?Weight:  [88 kg] 88 kg (05/03 0500) ?Intake/Output   ?   05/02 0701 ?05/03 0700 05/03 0701 ?05/04 0700  ? I.V. (mL/kg) 2096.4 (23.8) 523.8 (6)  ? NG/GT 1082 400  ? IV Piggyback 400.1 73  ? Total Intake(mL/kg) 3578.4 (40.7) 996.8 (11.3)  ? Urine (mL/kg/hr) 1775 (0.8) 1100 (1.3)  ? Stool 0 1  ? Total Output 1775 1101  ? Net +1803.4 -104.2  ?     ? Urine Occurrence 3 x   ? Stool Occurrence 7 x   ?  ?Awake, alert, oriented ?Hoarse but fluent speech ?Good strength throughout ?Wounds c/d/i ? ?LABS: ?Lab Results  ?Component Value Date  ? CREATININE 0.59 08/31/2021  ? BUN 11 08/31/2021  ? NA 143 08/31/2021  ? K 3.5 08/31/2021  ? CL 116 (H) 08/31/2021  ? CO2 22 08/31/2021  ? ?Lab Results  ?Component Value Date  ? WBC 14.3 (H) 08/31/2021  ? HGB 10.0 (L) 08/31/2021  ? HCT 31.1 (L) 08/31/2021  ? MCV 100.6 (H) 08/31/2021  ? PLT 152 08/31/2021  ? ?TCD: ?Date POD PCO2 HCT BP   MCA ACA PCA OPHT SIPH VERT Basilar  ?4/30 ?CK         Right  ?Left  ? *  ?*  ? *  ?*  ? *  ?*  ? 30  ?37  ? *  ?*  ? 11  ?16  ? *  ?   ?  ?5/1,RS         Right  ?Left  ? *  ?69  ? *  ?-82  ? 27  ?*  ? 19  ?26  ? 38  ?41  ? *  ?*  ? *  ?   ?  ?5/3,rs         Right  ?Left  ? 108  ?95  ? -53  ?-35  ? *  ?*  ? 29  ?34  ? 50  ?53  ? -32  ?-23  ? *  ?   ?  ? ? ?IMPRESSION: ?- 66 y.o. female SAH d# 5 s/p coil embo L PCom aneurysm, neurologically improving now with nearly non-focal exam. ?- SZ provoked by [facility] ?- TCD not suggestive of spasm ? ?PLAN: ?- Cont ICU observation for spasm ?- Cont Nimotop ?- TCD today ?- Cont Keppra ?- PT/OT/SLP evals ? ? ?Lisbeth Renshaw, MD ?[facility]  ? ?

## 2021-08-31 NOTE — Progress Notes (Addendum)
Inpatient Rehab Admissions Coordinator:  ? ? I spoke with Pt.'s son regarding potential CIR admit. He is interested and states family can provide 24/7 support at discharge. He states that he thinks Pt. Does have insurance and that he will look for a card. I will pursue for potential CIR admit once medically ready and once insurance has been sorted out.  ? ? ?Megan Salon, MS, CCC-SLP ?Rehab Admissions Coordinator  ?[phone] (celll) ?[phone] (office) ? ?

## 2021-08-31 NOTE — Progress Notes (Signed)
PT Cancellation Note ? ?Patient Details ?Name: Kayla Maynard ?MRN: [id number] ?DOB: 1956/02/25 ? ? ?Cancelled Treatment:    Reason Eval/Treat Not Completed: Medical issues which prohibited therapy; attempted x1 to see pt and she was s/p central line removal and needed to remain in bed 30 more minutes.  Unable to return for second attempt.  Will attempt again another day. ? ? ?Elray Mcgregor ?08/31/2021, 5:03 PM ?Sheran Lawless, PT ?Acute Rehabilitation Services ?[phone] ?[phone] ?08/31/2021 ? ?

## 2021-08-31 NOTE — Progress Notes (Signed)
? ?NAME:  Kayla Maynard, MRN:  [id number], DOB:  1955-07-22, LOS: 4 ?ADMISSION DATE:  08/27/2021, CONSULTATION DATE:  08/27/21 ?REFERRING MD:  Saintclair Halsted - NSGY, CHIEF COMPLAINT:  SAH, endotracheally intubated  ? ?History of Present Illness:  ?66 yo F PMH angina presented to [facility] 4/29 after loss of consciousness at home. Family actually started CPR per 911 advice though unclear if pulses were actually lost -- sounds like there was hypertonicity and myoclonus, more concerning for seizure. On EMS arrival pt awake, hypoglycemic, got d10, was awake ? ?In ED denied hx sz. Endorsed HA and neck pain, worse x 2 wk. Had 2 additional sz in ED. Intubated for airway protection. Found to have ICH -- SAH, SDH. Became bradycardic, Started on 3% and cleviprex  ? ?[facility] for further care ? ?Pertinent  Medical History  ?Angina  ? ?Significant Hospital Events: ?Including procedures, antibiotic start and stop dates in addition to other pertinent events   ?4/29  multiple seizures. Hypoglycemia (factitious). Intubated. Found to have SAH, SDH. 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  Dominant left posterior communicating artery patent ?4/29 Left-sided pterional craniotomy for evacuation of left-sided subdural hematoma with implantation of the bone flap in the left abdominal wall ?4/30 considerable improvement postintervention with near complete resolution of midline shift. ?5/1 weaned 11hrs but still too sleepy for extubation, Intracranial drain removed by NS this morning. Tmax 101 ?5/2 aline removed/ not working, more awake/ extubated  ? ?Interim History / Subjective:  ?C/o ongoing headache and dizziness today.  Seeing flowers on the wall.  ?Off pressors ?Diarrhea overnight ?Afebrile  ? ?Objective   ?Blood pressure 135/65, pulse 66, temperature  98.6 ?F (37 ?C), temperature source Oral, resp. rate (!) 22, height 5\' 6"  (1.676 m), weight 88 kg, SpO2 96 %. ?   ?   ? ?Intake/Output Summary (Last 24 hours) at 08/31/2021 1023 ?Last data filed at 08/31/2021 1000 ?Gross per 24 hour  ?Intake 3571.23 ml  ?Output 1776 ml  ?Net 1795.23 ml  ? ?Filed Weights  ? 08/27/21 2348 08/30/21 0500 08/31/21 0500  ?Weight: 82.2 kg 88 kg 88 kg  ? ?Examination: ?General:  Older adult female sitting in bed in NAD ?HEENT: MM pink/dry, pupils 3/reactive, cortrak, crani site wnl/ flat ?Neuro: easily awakens, oriented x 3, MAE, sleepy at time ?CV: rr, NSR, no murmur ?PULM:  non labored, CTA ?GI: soft, bs+, ND, bone flap site left abd dressing cdi ?Extremities: warm/dry, no LE edema  ?Skin: no rashes  ? ? ?UOP 1.7 plus 3 unmeasured occurrences  ?7 stools overnight  ? ?K 3.5 ?Cl 117> 116 ?Folate 6.8 ?TSH 0.954 ?WBC 2> 14 ?Hgb 9.8> 10 ? ? ?Resolved hospital problem list:  ? ? ?Assessment & Plan:  ?Acute respiratory failure with hypoxia requiring MV ?- extubated 5/2 ?- wean O2 for sats > 94% ?- aggressive pulm hygiene- IS/ mobilize w/ PT ?- pending SLP today  ? ?Compression of brain due to nontraumatic subarachnoid hemorrhage ([place])- Due to [facility] and SDH.  ?Nontraumatic subarach bleed from left posterior communicating artery ([place]) ?Subdural hematoma, acute ([place])- ?-drain removed 5/1 by NSGY ?- cont to closely monitor neuro status, high risk for vasospasms  ?- SBP goal liberalize per NSGY as long as no neuro changes ?- trending TCDs ?- cont nimotop ?- cont NS 75 ml/hr  ? ?Seizure ([place]) ?-  con't keppra ?- seizure precautions ?- Maintain neuro protective measures; goal for eurothermia, euglycemia, eunatermia, normoxia, and PCO2 goal of 35-40 ?- Nutrition and bowel regiment - cortrak for now, pending SLP today  ?- Aspirations precautions  ? ?Hypokalemia ?Hypophos ?- replete as needed, trend on labs ?- goal K>4, Mag> 2 ? ?At risk for malnutrition ?-con't TF per cortrak and assess swallowing as able   ? ?Fever ?- better today  ?- no obvious source, ?central.  WBC decreasing ?- monitor clinically for now ? ?Macrocytic anemia  ?-  TSH, folate ok, pending TSH ?- trend on CBC> stable ? ?Son updated at bedside 5/2. ? ?Best Practice (right click and "Reselect all SmartList Selections" daily)  ? ?Diet/type: NPO TF per cortrak for now pending swallow eval post extubation ?DVT prophylaxis: prophylactic heparin   ?GI prophylaxis: PPI ?Lines: Central line and yes and it is still needed ?Foley:  N/A ?Code Status:  full code ?Last date of multidisciplinary goals of care discussion ? ?CCT:  31 mins ? ? ?Kennieth Rad, ACNP ?[facility] ?08/31/2021, 10:23 AM ? ?See Amion for pager ?If no response to pager, please call PCCM consult pager ?After 7:00 pm call Elink   ? ? ? ? ?

## 2021-09-01 ENCOUNTER — Inpatient Hospital Stay (HOSPITAL_COMMUNITY): Payer: Medicare Other

## 2021-09-01 LAB — CBC WITH DIFFERENTIAL/PLATELET
Abs Immature Granulocytes: 0.15 10*3/uL — ABNORMAL HIGH (ref 0.00–0.07)
Basophils Absolute: 0 10*3/uL (ref 0.0–0.1)
Basophils Relative: 0 %
Eosinophils Absolute: 0 10*3/uL (ref 0.0–0.5)
Eosinophils Relative: 0 %
HCT: 30.6 % — ABNORMAL LOW (ref 36.0–46.0)
Hemoglobin: 10 g/dL — ABNORMAL LOW (ref 12.0–15.0)
Immature Granulocytes: 1 %
Lymphocytes Relative: 13 %
Lymphs Abs: 1.6 10*3/uL (ref 0.7–4.0)
MCH: 32.2 pg (ref 26.0–34.0)
MCHC: 32.7 g/dL (ref 30.0–36.0)
MCV: 98.4 fL (ref 80.0–100.0)
Monocytes Absolute: 0.9 10*3/uL (ref 0.1–1.0)
Monocytes Relative: 7 %
Neutro Abs: 9.6 10*3/uL — ABNORMAL HIGH (ref 1.7–7.7)
Neutrophils Relative %: 79 %
Platelets: 158 10*3/uL (ref 150–400)
RBC: 3.11 MIL/uL — ABNORMAL LOW (ref 3.87–5.11)
RDW: 13.2 % (ref 11.5–15.5)
WBC: 12.2 10*3/uL — ABNORMAL HIGH (ref 4.0–10.5)
nRBC: 0.2 % (ref 0.0–0.2)

## 2021-09-01 LAB — COMPREHENSIVE METABOLIC PANEL
ALT: 23 U/L (ref 0–44)
AST: 22 U/L (ref 15–41)
Albumin: 2.3 g/dL — ABNORMAL LOW (ref 3.5–5.0)
Alkaline Phosphatase: 121 U/L (ref 38–126)
Anion gap: 8 (ref 5–15)
BUN: 13 mg/dL (ref 8–23)
CO2: 23 mmol/L (ref 22–32)
Calcium: 8.2 mg/dL — ABNORMAL LOW (ref 8.9–10.3)
Chloride: 109 mmol/L (ref 98–111)
Creatinine, Ser: 0.53 mg/dL (ref 0.44–1.00)
GFR, Estimated: >60 mL/min (ref >60)
Glucose, Bld: 144 mg/dL — ABNORMAL HIGH (ref 70–99)
Potassium: 3.8 mmol/L (ref 3.5–5.1)
Sodium: 140 mmol/L (ref 135–145)
Total Bilirubin: 0.6 mg/dL (ref 0.3–1.2)
Total Protein: 5.4 g/dL — ABNORMAL LOW (ref 6.5–8.1)

## 2021-09-01 LAB — GLUCOSE, CAPILLARY
Glucose-Capillary: 115 mg/dL — ABNORMAL HIGH (ref 70–99)
Glucose-Capillary: 127 mg/dL — ABNORMAL HIGH (ref 70–99)
Glucose-Capillary: 132 mg/dL — ABNORMAL HIGH (ref 70–99)
Glucose-Capillary: 137 mg/dL — ABNORMAL HIGH (ref 70–99)
Glucose-Capillary: 139 mg/dL — ABNORMAL HIGH (ref 70–99)
Glucose-Capillary: 143 mg/dL — ABNORMAL HIGH (ref 70–99)
Glucose-Capillary: 180 mg/dL — ABNORMAL HIGH (ref 70–99)

## 2021-09-01 LAB — PHOSPHORUS: Phosphorus: 3.5 mg/dL (ref 2.5–4.6)

## 2021-09-01 LAB — MAGNESIUM: Magnesium: 2 mg/dL (ref 1.7–2.4)

## 2021-09-01 MED ORDER — SODIUM CHLORIDE 0.9 % IV SOLN: 2.0000 g | Freq: Three times a day (TID) | INTRAVENOUS | Status: DC

## 2021-09-01 MED ORDER — FUROSEMIDE 10 MG/ML IJ SOLN
20.0000 mg | Freq: Once | INTRAMUSCULAR | Status: AC
Start: 2021-09-01 — End: 2021-09-01
  Administered 2021-09-01: 20 mg via INTRAVENOUS
  Filled 2021-09-01: qty 2

## 2021-09-01 MED ORDER — POTASSIUM CHLORIDE 20 MEQ PO PACK
40.0000 meq | PACK | Freq: Once | ORAL | Status: AC
Start: 2021-09-01 — End: 2021-09-01
  Administered 2021-09-01: 40 meq
  Filled 2021-09-01: qty 2

## 2021-09-01 MED ORDER — OSMOLITE 1.5 CAL PO LIQD
1000.0000 mL | ORAL | Status: DC
  Administered 2021-09-01 – 2021-09-02 (×2): 1000 mL

## 2021-09-01 MED ORDER — ENSURE ENLIVE PO LIQD
237.0000 mL | Freq: Two times a day (BID) | ORAL | Status: DC
  Administered 2021-09-02 – 2021-09-12 (×16): 237 mL

## 2021-09-01 MED ORDER — SODIUM CHLORIDE 0.9 % IV SOLN
2.0000 g | Freq: Three times a day (TID) | INTRAVENOUS | Status: AC
  Administered 2021-09-01 – 2021-09-05 (×14): 2 g via INTRAVENOUS
  Filled 2021-09-01 (×14): qty 12.5

## 2021-09-01 NOTE — Progress Notes (Signed)
Speech Language Pathology Treatment: Dysphagia  ?Patient Details ?Name: Kayla Maynard ?MRN: [id number] ?DOB: 1956/02/17 ?Today's Date: 09/01/2021 ?Time: 9735-3299 ?SLP Time Calculation (min) (ACUTE ONLY): 10 min ? ?Assessment / Plan / Recommendation ?Clinical Impression ? Pt wants ice and water but strongly declines any other POs offered, saying it will make her sick - although she denies any nausea at the moment. Despite encouragement and education from SLP, not able to observe her with any solid textures. Pt had functional appearing swallowing with thin liquids without overt signs of dysphagia or aspiration, although this should still be carefully monitored in light of her respiratory changes overnight. Hopeful that pt will have good prognosis for return to solids when she will try them, although note that her family does share that she "choked" frequently on solids (steaks, hamburgers, etc.) PTA. Question an esophageal component as pt also reports frequent heartburn, but will need to observe further to make additional recommendations.  ?  ?HPI HPI: Kayla Maynard is a 66 y/o woman who presented with LOC and was found to have seizures in the ED. On head CT she was found to have [facility] & SDH. She had a L PICA aneurysm coiled and underwent L pterional craniotomy for evacuation of SDH, bone flap in abdomen. Intubated from 4/28-5/2. Failed [facility]. ?  ?   ?SLP Plan ? Continue with current plan of care ? ?  ?  ?Recommendations for follow up therapy are one component of a multi-disciplinary discharge planning process, led by the attending physician.  Recommendations may be updated based on patient status, additional functional criteria and insurance authorization. ?  ? ?Recommendations  ?Diet recommendations: Thin liquid ?Liquids provided via: Cup;Straw ?Medication Administration: Via alternative means ?Supervision: Patient able to self feed;Full supervision/cueing for compensatory strategies ?Compensations: Slow rate;Small  sips/bites ?Postural Changes and/or Swallow Maneuvers: Seated upright 90 degrees  ?   ?    ?   ? ? ? ? Oral Care Recommendations: Oral care BID ?Follow Up Recommendations: Acute inpatient rehab (3hours/day) ?Assistance recommended at discharge: Frequent or constant Supervision/Assistance ?SLP Visit Diagnosis: Dysphagia, unspecified (R13.10) ?Plan: Continue with current plan of care ? ? ? ? ?  ?  ? ? ?Mahala Menghini., M.A. CCC-SLP ?Acute Rehabilitation Services ?Office [phone] ? ?Secure chat preferred ? ? ?09/01/2021, 9:44 AM ?

## 2021-09-01 NOTE — Evaluation (Signed)
Speech Language Pathology Evaluation ?Patient Details ?Name: Kayla Maynard ?MRN: [id number] ?DOB: 06-Feb-1956 ?Today's Date: 09/01/2021 ?Time: [id number] ?SLP Time Calculation (min) (ACUTE ONLY): 19 min ? ?Problem List:  ?Patient Active Problem List  ? Diagnosis Date Noted  ? SAH (subarachnoid hemorrhage) ([place]) 08/28/2021  ? 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 Subdural hematoma, acute ([place]) 08/27/2021  ? On mechanically assisted ventilation ([place]) 08/27/2021  ? Compression of brain due to nontraumatic subarachnoid hemorrhage ([place]) 08/27/2021  ? Seizure ([place]) 08/27/2021  ? ?Past Medical History:  ?Past Medical History:  ?Diagnosis Date  ? Angina at rest [facility])   ? ?Past Surgical History:  ?Past Surgical History:  ?Procedure Laterality Date  ? CRANIOTOMY Left 08/27/2021  ? Procedure: CRANIOTOMY FOR EVACUATION OF SUBDURAL HEMATOMA WITH PLACEMENT OF BONE FLAP IN ABDOMEN;  Surgeon: Kary Kos, MD;  Location: [place];  Service: Neurosurgery;  Laterality: Left;  ? IR ANGIO INTRA EXTRACRAN SEL COM CAROTID INNOMINATE UNI R MOD SED  08/27/2021  ? IR ANGIO INTRA EXTRACRAN SEL INTERNAL CAROTID UNI L MOD SED  08/30/2021  ? IR ANGIO VERTEBRAL SEL SUBCLAVIAN INNOMINATE UNI R MOD SED  08/30/2021  ? IR ANGIOGRAM FOLLOW UP STUDY  08/27/2021  ? IR ANGIOGRAM FOLLOW UP STUDY  08/27/2021  ? IR ANGIOGRAM FOLLOW UP STUDY  08/27/2021  ? IR ANGIOGRAM FOLLOW UP STUDY  08/27/2021  ? IR ANGIOGRAM FOLLOW UP STUDY  08/27/2021  ? IR ANGIOGRAM FOLLOW UP STUDY  08/27/2021  ? IR ANGIOGRAM FOLLOW UP STUDY  08/27/2021  ? IR CT HEAD LTD  08/27/2021  ? IR NEURO EACH ADD'L AFTER BASIC UNI LEFT (MS)  08/30/2021  ? IR TRANSCATH/EMBOLIZ  08/27/2021  ? RADIOLOGY WITH ANESTHESIA N/A 08/27/2021  ? Procedure: RADIOLOGY WITH ANESTHESIA;  Surgeon: Luanne Bras, MD;  Location: [place];  Service: Radiology;  Laterality: N/A;  ? STENT PLACEMENT VASCULAR ([place] HX)    ? ?HPI:  ?Kayla Maynard is a 66 y/o woman who presented with LOC and was  found to have seizures in the ED. On head CT she was found to have Kayla Maynard & Kayla Maynard. She had a L Kayla Maynard aneurysm coiled and underwent L pterional craniotomy for evacuation of Kayla Maynard, bone flap in abdomen. Intubated from 4/28-5/2. Failed Kayla Maynard.  ? ?Assessment / Plan / Recommendation ?Clinical Impression ? Pt presents with cognitive and communicative impairments compared to her baseline, as she reports being independent and living alone. Her articulation is mildly imprecise, voice is monotonous, and her affect is flat. Cognitive performance is also impacted by pain and lethargy at the moment, but she did participate in SLUMS, scoring 12/24 (total possible points adjusted as pt was not visually able to complete all tasks). Her sustained attention, immediate and delayed recall, and simple problem solving skills are impacted. She would benefit from ongoing SLP f/u to maximize cognition and communicaiton. ?   ?SLP Assessment ? SLP Recommendation/Assessment: Patient needs continued [place] [facility] Services ?SLP Visit Diagnosis: Cognitive communication deficit (R41.841)  ?  ?Recommendations for follow up therapy are one component of a multi-disciplinary discharge planning process, led by the attending physician.  Recommendations may be updated based on patient status, additional functional criteria and insurance authorization. ?   ?Follow Up Recommendations ? Acute inpatient rehab (3hours/day)  ?  ?Assistance Recommended at Discharge ? Frequent or constant Supervision/Assistance  ?Functional Status Assessment Patient has had a recent decline in their functional status and demonstrates the ability to make significant improvements in function in  a reasonable and predictable amount of time.  ?Frequency and Duration min 2x/week  ?2 weeks ?  ?   ?SLP Evaluation ?Cognition ? Overall Cognitive Status: Impaired/Different from baseline ?Arousal/Alertness: Lethargic ?Orientation Level: Oriented to person;Oriented to place;Oriented to  time ?Attention: Sustained ?Sustained Attention: Impaired ?Sustained Attention Impairment: Verbal basic ?Memory: Impaired ?Memory Impairment: Storage deficit;Retrieval deficit;Decreased recall of new information ?Problem Solving: Impaired ?Problem Solving Impairment: Verbal basic  ?  ?   ?Comprehension ? Auditory Comprehension ?Overall Auditory Comprehension: Appears within functional limits for tasks assessed  ?  ?Expression Expression ?Primary Mode of Expression: Verbal ?Verbal Expression ?Overall Verbal Expression: Impaired ?Initiation: No impairment ?Level of Generative/Spontaneous Verbalization: Conversation ?Naming: Impairment ?Divergent: Other (comment) (named 7 words in one minute) ?Pragmatics: Impairment ?Impairments: Abnormal affect;Monotone ?Non-Verbal Means of Communication: Not applicable   ?Oral / Motor ? Motor Speech ?Overall Motor Speech: Impaired ?Respiration: Within functional limits ?Phonation: Normal ?Resonance: Within functional limits ?Articulation: Impaired ?Level of Impairment: Conversation ?Intelligibility: Intelligibility reduced ?Conversation: 75-100% accurate ?Effective Techniques: Increased vocal intensity   ?        ? ?Osie Bond., M.A. CCC-SLP ?Acute Rehabilitation Services ?Office [phone] ? ?Secure chat preferred ? ?09/01/2021, 10:00 AM ? ?

## 2021-09-01 NOTE — Progress Notes (Signed)
Pharmacy Antibiotic Note ? ?Kayla Maynard is a 66 y.o. female admitted on 08/27/2021 with pneumonia.  Pharmacy has been consulted for Unasyn>>cefepime dosing. ? ?Plan: ?Patient has history of hives with PCN.  ?Changing from unasyn to cefepime 2gm q8hr ?Will monitor for acute changes in renal function and adjust as needed ?F/u cultures results and de-escalate as appropriate ? ? ?Height: 5\' 6"  (167.6 cm) ?Weight: 88 kg (194 lb 0.1 oz) ?IBW/kg (Calculated) : 59.3 ? ?Temp (24hrs), Avg:99.1 ?F (37.3 ?C), Min:98.1 ?F (36.7 ?C), Max:100.5 ?F (38.1 ?C) ? ?Recent Labs  ?Lab 08/28/21 ?0430 08/29/21 ?10/29/21 08/30/21 ?0358 08/31/21 ?10/31/21 09/01/21 ?0359  ?WBC 20.9* 16.7* 12.0* 14.3* 12.2*  ?CREATININE 0.70 0.63 0.58 0.59 0.53  ?  ?Estimated Creatinine Clearance: 80.4 mL/min (by C-G formula based on SCr of 0.53 mg/dL).   ? ?Allergies  ?Allergen Reactions  ? Morphine   ? Penicillins Hives  ? ? ?Thank you for allowing pharmacy to be a part of this patient?s care. ? ?11/01/21, PharmD ?Clinical Pharmacist ? ?Please check AMION for all [facility] Pharmacy numbers ?After 10:00 PM, call Main Pharmacy [phone] ? ? ?

## 2021-09-01 NOTE — Progress Notes (Signed)
? ?NAME:  Kayla Maynard, MRN:  [id number], DOB:  [birth year], LOS: 5 ?ADMISSION DATE:  08/27/2021, CONSULTATION DATE:  08/27/21 ?REFERRING MD:  Wynetta Emery - NSGY, CHIEF COMPLAINT:  SAH, endotracheally intubated  ? ?History of Present Illness:  ?66 yo F PMH angina presented to [facility] 4/29 after loss of consciousness at home. Family actually started CPR per 911 advice though unclear if pulses were actually lost -- sounds like there was hypertonicity and myoclonus, more concerning for seizure. On EMS arrival pt awake, hypoglycemic, got d10, was awake ? ?In ED denied hx sz. Endorsed HA and neck pain, worse x 2 wk. Had 2 additional sz in ED. Intubated for airway protection. Found to have ICH -- SAH, SDH. Became bradycardic, Started on 3% and cleviprex  ? ?[facility]red [facility] for further care ? ?Pertinent  Medical History  ?Angina  ? ?Significant Hospital Events: ?Including procedures, antibiotic start and stop dates in addition to other pertinent events   ?4/29  multiple seizures. Hypoglycemia (factitious). Intubated. Found to have SAH, SDH. 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  Dominant left posterior communicating artery patent ?4/29 Left-sided pterional craniotomy for evacuation of left-sided subdural hematoma with implantation of the bone flap in the left abdominal wall ?4/30 considerable improvement postintervention with near complete resolution of midline shift. ?5/1 weaned 11hrs but still too sleepy for extubation, Intracranial drain removed by NS this morning. Tmax 101 ?5/2 aline removed/ not working, more awake/ extubated  ?5/4 working with PT  ? ?Interim History / Subjective:  ? No vasospasm ? ?Working w PT  ? ?C/o HA  ? ?Incr O2 requirement overnight  ? ?Objective   ?Blood pressure 124/69, pulse 60, temperature 98.1 ?F (36.7 ?C),  resp. rate 17, height 5\' 6"  (1.676 m), weight 88 kg, SpO2 97 %. ?   ?FiO2 (%):  [55 %] 55 %  ? ?Intake/Output Summary (Last 24 hours) at 09/01/2021 1209 ?Last data filed at 09/01/2021 1200 ?Gross per 24 hour  ?Intake 3723.4 ml  ?Output 1300 ml  ?Net 2423.4 ml  ? ?Filed Weights  ? 08/30/21 0500 08/31/21 0500 09/01/21 0315  ?Weight: 88 kg 88 kg 88 kg  ? ?Examination: ?General:  Ill appearing adult F side lying NAD  ?HEENT: Surgical site dressing intact. Anicteric sclera. Pink mm, [place] in place  ?Neuro: Drowsy. Oriented x 2, Following commands.  ?CV: rrr s1s2 no rgm cap refill < 3sec ?PULM: CTAb even unlabored symmetrical chest expansion   ?GI: soft ndnt  ?Extremities: no acute deformity ?Skin: c/d/w ? ? ?Resolved hospital problem list:  ? ? ?Assessment & Plan:  ? ?Acute respiratory failure with hypoxia ?- extubated 5/2 ?-CXR with bilateral opacities 5/4 ? Aspiration based on hx dysphagia. Had incr in O2 need 5/3-4 night ?P ?-diurese 5/4 -- will give 20 lasix  ?-5/5 AM CXR  ?-and add abx. -- cefepime given pcn allergy. Will plan for short course but can always check a PCT to de-escalate earlier if needed  ?- wean O2 for sats > 94% ?- aggressive pulm hygiene- IS/ mobilize w/ PT ? 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 ?Dysphagia ?P ?-SLP ?-cortrak  ?-90 degree when taking POs. Thin liquid, straw ? ?Macrocytic anemia  ?-  PRN CBC  ? ?Hyperglycemia ?-SSI  ? ? ?Best Practice (right click and "Reselect all SmartList Selections" daily)  ? ?Diet/type: NPO TF per cortrak for now pending swallow eval post extubation ?DVT prophylaxis: prophylactic heparin   ?GI prophylaxis: PPI ?Lines: N/A ?Foley:  N/A ?Code Status:  full  code ?Last date of multidisciplinary goals of care discussion-- pt and family updated at bedside 5/4 ? ?CRITICAL CARE ?Performed by: Lanier Clam ? ?Total critical care time: 35 minutes ? ?Critical care time was exclusive of separately billable procedures and treating other patients. ?Critical care was necessary to treat or prevent imminent or life-threatening deterioration. ? ?Critical care was time spent personally by me on the following activities: development of treatment plan with patient and/or surrogate as well as nursing, discussions with consultants, evaluation of patient's response to treatment, examination of patient, obtaining history from patient or surrogate, ordering and performing treatments and interventions, ordering and review of laboratory studies, ordering and review of radiographic studies, pulse oximetry and re-evaluation of patient's condition. ? ?Tessie Fass MSN, AGACNP-BC ?[facility] ?Amion for pager ?09/01/2021, 12:09 PM ? ? ? ? ? ?

## 2021-09-01 NOTE — Progress Notes (Signed)
eLink Physician-Brief Progress Note ?Patient Name: Kayla Maynard ?DOB: 1956/04/11 ?MRN: [id number] ? ? ?Date of Service ? 09/01/2021  ?HPI/Events of Note ? Notified of increased O2 requirement. From 5L [place] at beginning of the shift to venti mask.  ?PT reported to be more somnolent as well.   ?eICU Interventions ? CXR ordered to further evaluate.   ? ? ? ?  ? ?Thurnell Garbe CRUZ ?09/01/2021, 4:46 AM ?

## 2021-09-01 NOTE — Progress Notes (Signed)
Physical Therapy Treatment ?Patient Details ?Name: Kayla Maynard ?MRN: [id number] ?DOB: 1956-01-24 ?Today's Date: 09/01/2021 ? ? ?History of Present Illness Kayla Maynard is a 66 y/o woman who presented with LOC and was found to have seizures x3 in the ED. CT showed extensive subarachnoid hemorrhage bilaterally and large SDH L frontal/ parietal/ temporal. 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  PMH: angina, back surgery ? ?  ?PT Comments  ? ? Pt limited by reports of dizziness and headache today. Pt demonstrates improvement in bed mobility quality and is able to transfer out of bed with assistance. Pt with imbalance with stepping and will benefit from further attempts at aggressive mobilization and gait initiation next session. PT provides encouragement for participation as pt initially hesitant due to headache and dizziness, reporting a fear of nausea. PT continues to recommend AIR admission.   ?Recommendations for follow up therapy are one component of a multi-disciplinary discharge planning process, led by the attending physician.  Recommendations may be updated based on patient status, additional functional criteria and insurance authorization. ? ?Follow Up Recommendations ? Acute inpatient rehab (3hours/day) ?  ?  ?Assistance Recommended at Discharge Intermittent Supervision/Assistance  ?Patient can return home with the following A lot of help with walking and/or transfers;A lot of help with bathing/dressing/bathroom;Assistance with cooking/housework;Assist for transportation;Help with stairs or ramp for entrance ?  ?Equipment Recommendations ? Rolling walker (2 wheels)  ?  ?Recommendations for Other Services   ? ? ?  ?Precautions / Restrictions Precautions ?Precautions: Fall ?Precaution Comments: L crani, bone flap in L abdominal wall ?Restrictions ?Weight Bearing Restrictions: No  ?  ? ?Mobility ? Bed Mobility ?Overal bed mobility: Needs Assistance ?Bed Mobility: Supine to Sit ?   ?  ?Supine to sit: Min assist, HOB elevated ?  ?  ?General bed mobility comments: use of rails, assist to pivot hips ?  ? ?Transfers ?Overall transfer level: Needs assistance ?Equipment used: 2 person hand held assist ?Transfers: Sit to/from Stand, Bed to chair/wheelchair/BSC ?Sit to Stand: Min assist, +2 physical assistance ?  ?Step pivot transfers: Min assist, +2 physical assistance ?  ?  ?  ?  ?  ? ?Ambulation/Gait ?Ambulation/Gait assistance:  (ambulation declined due to nausea) ?  ?  ?  ?  ?  ?  ?  ? ? ?Stairs ?  ?  ?  ?  ?  ? ? ?Wheelchair Mobility ?  ? ?Modified Rankin (Stroke Patients Only) ?Modified Rankin (Stroke Patients Only) ?Pre-Morbid Rankin Score: No symptoms ?Modified Rankin: Moderately severe disability ? ? ?  ?Balance Overall balance assessment: Needs assistance ?Sitting-balance support: No upper extremity supported, Feet supported, Single extremity supported ?Sitting balance-Leahy Scale: Poor ?Sitting balance - Comments: intermittent UE support for static sitting ?  ?Standing balance support: Bilateral upper extremity supported ?Standing balance-Leahy Scale: Poor ?Standing balance comment: reliant on BUE support and minA ?  ?  ?  ?  ?  ?  ?  ?  ?  ?  ?  ?  ? ?  ?Cognition Arousal/Alertness: Awake/alert ?Behavior During Therapy: [facility] for tasks assessed/performed ?Overall Cognitive Status: Impaired/Different from baseline ?Area of Impairment: Problem solving ?  ?  ?  ?  ?  ?  ?  ?  ?  ?  ?  ?  ?  ?  ?Problem Solving: Slow processing ?  ?  ?  ? ?  ?Exercises   ? ?  ?General Comments General comments (skin integrity, edema, etc.): VSS on 8L HFNC,  pt reports dizziness and HA, no spontaneous nystagmus observed ?  ?  ? ?Pertinent Vitals/Pain Pain Assessment ?Pain Assessment: Faces ?Faces Pain Scale: Hurts worst (pt reports headache cant get any worse) ?Pain Location: head ?Pain Descriptors / Indicators: Headache ?Pain Intervention(s): Monitored during session  ? ? ?Home Living   ?  ?Available Help at  Discharge: Family;Available 24 hours/day ?Type of Home: House ?  ?  ?  ?  ?  ?  ?   ?  ?Prior Function    ?  ?  ?   ? ?PT Goals (current goals can now be found in the care plan section) Acute Rehab PT Goals ?Patient Stated Goal: return home ?Progress towards PT goals: Progressing toward goals ? ?  ?Frequency ? ? ? Min 4X/week ? ? ? ?  ?PT Plan Current plan remains appropriate  ? ? ?Co-evaluation   ?  ?  ?  ?  ? ?  ?AM-PAC PT "6 Clicks" Mobility   ?Outcome Measure ? Help needed turning from your back to your side while in a flat bed without using bedrails?: A Little ?Help needed moving from lying on your back to sitting on the side of a flat bed without using bedrails?: A Little ?Help needed moving to and from a bed to a chair (including a wheelchair)?: A Lot ?Help needed standing up from a chair using your arms (e.g., wheelchair or bedside chair)?: A Lot ?Help needed to walk in hospital room?: Total ?Help needed climbing 3-5 steps with a railing? : Total ?6 Click Score: 12 ? ?  ?End of Session Equipment Utilized During Treatment: Gait belt;Oxygen ?Activity Tolerance: Treatment limited secondary to medical complications (Comment) (headahce) ?Patient left: in chair;with call bell/phone within reach;with chair alarm set;with family/visitor present ?Nurse Communication: Mobility status ?PT Visit Diagnosis: Unsteadiness on feet (R26.81);Pain;Difficulty in walking, not elsewhere classified (R26.2) ?  ? ? ?Time: 2458-0998 ?PT Time Calculation (min) (ACUTE ONLY): 30 min ? ?Charges:  $Therapeutic Activity: 8-22 mins          ?          ? ?Arlyss Gandy, PT, DPT ?Acute Rehabilitation ?Pager: [phone] ?Office [phone] ? ? ? ?Arlyss Gandy ?09/01/2021, 11:28 AM ? ?

## 2021-09-01 NOTE — Progress Notes (Signed)
?  NEUROSURGERY PROGRESS NOTE  ? ?Pt seen and examined. Had increased O2 requirement overnight. C/o largely unchanged HA and dizziness today. ? ?EXAM: ?Temp:  [98.1 ?F (36.7 ?C)-100.5 ?F (38.1 ?C)] 98.1 ?F (36.7 ?C) (05/04 0800) ?Pulse Rate:  [61-80] 63 (05/04 0800) ?Resp:  [17-32] 19 (05/04 0800) ?BP: (114-156)/(41-97) 151/73 (05/04 0800) ?SpO2:  [85 %-99 %] 98 % (05/04 0800) ?FiO2 (%):  [55 %] 55 % (05/04 0400) ?Weight:  [88 kg] 88 kg (05/04 0315) ?Intake/Output   ?   05/03 0701 ?05/04 0700 05/04 0701 ?05/05 0700  ? I.V. (mL/kg) 1702.1 (19.3) 69 (0.8)  ? NG/GT 1330 150  ? IV Piggyback 505.2   ? Total Intake(mL/kg) 3537.3 (40.2) 219 (2.5)  ? Urine (mL/kg/hr) 1300 (0.6)   ? Stool 1   ? Total Output 1301   ? Net +2236.3 +219  ?     ? Urine Occurrence 2 x   ? Stool Occurrence 1 x   ?  ?Awake, alert, oriented ?fluent speech ?Good strength throughout ?Wounds c/d/i ? ?LABS: ?Lab Results  ?Component Value Date  ? CREATININE 0.53 09/01/2021  ? BUN 13 09/01/2021  ? NA 140 09/01/2021  ? K 3.8 09/01/2021  ? CL 109 09/01/2021  ? CO2 23 09/01/2021  ? ?Lab Results  ?Component Value Date  ? WBC 12.2 (H) 09/01/2021  ? HGB 10.0 (L) 09/01/2021  ? HCT 30.6 (L) 09/01/2021  ? MCV 98.4 09/01/2021  ? PLT 158 09/01/2021  ? ?TCD: ?Date POD PCO2 HCT BP   MCA ACA PCA OPHT SIPH VERT Basilar  ?4/30 ?CK         Right  ?Left  ? *  ?*  ? *  ?*  ? *  ?*  ? 30  ?37  ? *  ?*  ? 11  ?16  ? *  ?   ?  ?5/1,RS         Right  ?Left  ? *  ?69  ? *  ?-82  ? 27  ?*  ? 19  ?26  ? 38  ?41  ? *  ?*  ? *  ?   ?  ?5/3,rs         Right  ?Left  ? 108  ?95  ? -53  ?-35  ? *  ?*  ? 29  ?34  ? 50  ?53  ? -32  ?-23  ? *  ?   ?  ? ? ?IMPRESSION: ?- 66 y.o. female [place] d# 5 s/p coil embo L PCom aneurysm, neurologically stable, non-focal exam. No clinical or ultrasonographic concern for spasm currently. ?- SZ provoked by [facility] ?- overnight hypoxia appears improved this am. ? ?PLAN: ?- Cont ICU observation for spasm ?- Cont Nimotop ?- TCD tomorrow ?- Cont Keppra ?- PT/OT/SLP  evals ?- Cont to monitor for s/sx of pneumonia ? ? ?Consuella Lose, MD ?[facility]  ? ?

## 2021-09-01 NOTE — Progress Notes (Signed)
Nutrition Follow-up ? ?DOCUMENTATION CODES:  ? ?Not applicable ? ?INTERVENTION:  ? ?Tube feeds via Cortrak: ?Once pt willing to try POs can possibly change to nocturnal TF until PO intake improved ? ?- Change to Osmolite 1.5 @ 50 ml/hr (1200 ml/day) ?- ProSource TF 45 ml TID ? ?Tube feeding regimen provides 1920 kcal, 108 grams of protein, and 917 ml of H2O.  ? ? ?Offer Ensure Enlive/Ensure Plus High Protein po BID ? ?NUTRITION DIAGNOSIS:  ? ?Inadequate oral intake related to inability to eat as evidenced by NPO status. ?Progressing.  ? ?GOAL:  ? ?Patient will meet greater than or equal to 90% of their needs ?Met with TF ? ?MONITOR:  ? ?Vent status, Labs, Weight trends, TF tolerance, Skin, I & O's ? ?REASON FOR ASSESSMENT:  ? ?Ventilator, Consult ?Enteral/tube feeding initiation and management ? ?ASSESSMENT:  ? ?66 year old female who presented to the ED on 4/29 after being found unresponsive by family. PMH of angina. Pt with AMS in the ED and required intubation. Pt admitted with SAH, SDH. ? ? ?Pt discussed during ICU rounds and with RN.  ? ?Sister at bedside and report that pt refused PO intake this morning and did not want to try anything. Sister says that pt gets sick after eating at home if she tries to eat hard to chew foods. Per sister pt eats foods like scrambled eggs and mashed potatoes but does not eat much at a time.  ?SLP following.  ? ?04/29 - s/p aneurysm coiling, s/p L craniotomy for evacuation of SDH with implantation of bone flap in L abdominal wall ?04/30 - TF started ?05/01 - Cortrak placed (tip in distal stomach) ?05/02 - extubated ?05/03 - diet advanced to Full Liquids  ? ?Admit weight: 81.6 kg ?Current weight: 88 kg ? ?Medications reviewed and include: nutrisource fiber, SSI q 4 hours ?NS @ 75 ml/hr ?  ?Labs reviewed:  ?CBG's: 132-180 x 24 hours ? ?Current TF:  ?Vital 1.5 @ 50 ml/hr  ?ProSource TF 45 ml TID ?Tube feeding regimen provides 1920 kcal, 114 grams of protein ? ?Diet Order:   ?Diet  Order   ? ?       ?  Diet full liquid Room service appropriate? Yes; Fluid consistency: Thin  Diet effective now       ?  ? ?  ?  ? ?  ? ? ?EDUCATION NEEDS:  ? ?No education needs have been identified at this time ? ?Skin:  Skin Assessment: ?Skin Integrity Issues: ?Incisions: groin, abdomen, L head ? ?Last BM:  5/4 type 7 x 3 small ? ?Height:  ? ?Ht Readings from Last 1 Encounters:  ?08/27/21 $RemoveBe'5\' 6"'[id number]$  (1.676 m)  ? ? ?Weight:  ? ?Wt Readings from Last 1 Encounters:  ?09/01/21 88 kg  ? ? ?BMI:  Body mass index is 31.31 kg/m?. ? ?Estimated Nutritional Needs:  ? ?Kcal:  1800-2000 ? ?Protein:  105-125 grams ? ?Fluid:  1.8-2.0 L ? ? ?Lockie Pares., RD, LDN, CNSC ?See AMiON for contact information  ? ?

## 2021-09-01 NOTE — Progress Notes (Signed)
Occupational Therapy Treatment ?Patient Details ?Name: Kayla Maynard ?MRN: [id number] ?DOB: June 12, 1955 ?Today's Date: 09/01/2021 ? ? ?History of present illness Ms. Kayla Maynard is a 66 y/o woman who presented with LOC and was found to have seizures x3 in the ED. CT showed extensive subarachnoid hemorrhage bilaterally and large SDH L frontal/ parietal/ temporal. 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  PMH: angina, back surgery ?  ?OT comments ? Pt progressing towards OT goals today; however reporting headache/pressure in head, and nausea (did not vomit during session). MAx A for LB dressing, improved bed mobility to min A with vc for sequencing and assist for trunk elevation/hips EOB. Once EOB engaged in grooming tasks with set up and min A for feeding lines/O2 line management. Pt then able to complete pivot to chair and multiple sit<>stand at min A+2. Also education provided on elevation, exercises to address BUE edema. Pt able to demonstrate. OT will continue to follow acutely, POC remains appropriate.  ? ?Recommendations for follow up therapy are one component of a multi-disciplinary discharge planning process, led by the attending physician.  Recommendations may be updated based on patient status, additional functional criteria and insurance authorization. ?   ?Follow Up Recommendations ? Acute inpatient rehab (3hours/day)  ?  ?Assistance Recommended at Discharge Frequent or constant Supervision/Assistance  ?Patient can return home with the following ? A lot of help with walking and/or transfers;A lot of help with bathing/dressing/bathroom;Assistance with cooking/housework;Assistance with feeding;Direct supervision/assist for medications management;Direct supervision/assist for financial management;Assist for transportation;Help with stairs or ramp for entrance ?  ?Equipment Recommendations ? Other (comment) (pending progression)  ?  ?Recommendations for Other Services Rehab consult ? ?   ?Precautions / Restrictions Precautions ?Precautions: Fall ?Precaution Comments: L crani, bone flap in L abdominal wall ?Restrictions ?Weight Bearing Restrictions: No  ? ? ?  ? ?Mobility Bed Mobility ?Overal bed mobility: Needs Assistance ?Bed Mobility: Supine to Sit ?  ?  ?Supine to sit: Min assist, HOB elevated ?  ?  ?General bed mobility comments: use of rails, use of bed pad to assist with hips to pivot EOB ?  ? ?Transfers ?Overall transfer level: Needs assistance ?Equipment used: 2 person hand held assist ?Transfers: Sit to/from Stand, Bed to chair/wheelchair/BSC ?Sit to Stand: Min assist, +2 physical assistance, +2 safety/equipment ?  ?  ?Step pivot transfers: Min assist, +2 physical assistance, +2 safety/equipment ?  ?  ?General transfer comment: At first took small, shuffling, ineffective steps. Improved with specific vc's for stepping. sit<>stand 4x this session ?  ?  ?Balance Overall balance assessment: Needs assistance ?Sitting-balance support: No upper extremity supported, Feet supported, Single extremity supported ?Sitting balance-Leahy Scale: Poor ?Sitting balance - Comments: intermittent UE support for static sitting ?  ?Standing balance support: Bilateral upper extremity supported ?Standing balance-Leahy Scale: Poor ?Standing balance comment: reliant on BUE support and minA ?  ?  ?  ?  ?  ?  ?  ?  ?  ?  ?  ?   ? ?ADL either performed or assessed with clinical judgement  ? ?ADL Overall ADL's : Needs assistance/impaired ?Eating/Feeding: NPO ?  ?Grooming: Sitting;Wash/dry face;Min guard ?  ?  ?  ?  ?  ?Upper Body Dressing : Moderate assistance;Sitting ?Upper Body Dressing Details (indicate cue type and reason): don gown like bathobe ?Lower Body Dressing: Maximal assistance ?Lower Body Dressing Details (indicate cue type and reason): donned socks at bed level ?Toilet Transfer: +2 for physical assistance;Stand-pivot;BSC/3in1;Minimal assistance;+2 for safety/equipment ?Statistician Details (  indicate  cue type and reason): +2 assist for line management as well ?  ?  ?  ?  ?Functional mobility during ADLs: +2 for physical assistance;+2 for safety/equipment;Minimal assistance;Cueing for safety ?General ADL Comments: requries cues for safety and positioning throughout. slow and deliberate for all funcitonal tasks ?  ? ?Extremity/Trunk Assessment Upper Extremity Assessment ?RUE Deficits / Details: generally weak, full AAROM available, slow and deliberate movements, generalized edema present ?RUE Coordination: decreased fine motor;decreased gross motor ?LUE Deficits / Details: generally weak, full AAROM available, slow and deliberate movements, edema throughout LUE ?LUE Coordination: decreased fine motor;decreased gross motor ?  ?Lower Extremity Assessment ?Lower Extremity Assessment: Defer to PT evaluation ?  ?  ?  ? ?Vision   ?Vision Assessment?: Vision impaired- to be further tested in functional context ?Additional Comments: swelling has gone down, tracking, needs further assessment ?  ?Perception   ?  ?Praxis   ?  ? ?Cognition Arousal/Alertness: Awake/alert ?Behavior [facility]: [facility] for tasks assessed/performed ?Overall Cognitive Status: Impaired/Different from baseline ?Area of Impairment: Problem solving, Attention ?  ?  ?  ?  ?  ?  ?  ?  ?  ?Current Attention Level: Sustained ?  ?  ?  ?  ?Problem Solving: Slow processing ?General Comments: pt a bit impulsive for mobilizing. vc for safety. follows most commands with increased time. ?  ?  ?   ?Exercises   ? ?  ?Shoulder Instructions   ? ? ?  ?General Comments VSS on 8L HFNC, pt reports dizziness and HA, no spontaneous nystagmus observed  ? ? ?Pertinent Vitals/ Pain       Pain Assessment ?Pain Assessment: 0-10 ?Pain Score: 10-Worst pain ever (pt reports headache cant get any worse) ?Faces Pain Scale: Hurts worst ?Pain Location: head ?Pain Descriptors / Indicators: Headache ?Pain Intervention(s): Limited activity within patient's tolerance, Monitored during  session, Repositioned ? ?Home Living   ?  ?Available Help at Discharge: Family;Available 24 hours/day ?Type of Home: House ?  ?  ?  ?  ?  ?  ?  ?  ?  ?  ?  ?  ?  ?  ? Lives With: Alone ? ?  ?Prior Functioning/Environment    ?  ?  ?  ?   ? ?Frequency ? Min 2X/week  ? ? ? ? ?  ?Progress Toward Goals ? ?OT Goals(current goals can now be found in the care plan section) ? Progress towards OT goals: Progressing toward goals ? ?Acute Rehab OT Goals ?Patient Stated Goal: get back to swimming and the beach ?OT Goal Formulation: With patient ?Time For Goal Achievement: 09/13/21 ?Potential to Achieve Goals: Good ?ADL Goals ?Pt Will Perform Grooming: with set-up;sitting ?Pt Will Perform Upper Body Dressing: with set-up;sitting ?Pt Will Perform Lower Body Dressing: with min guard assist;sit to/from stand ?Pt Will Transfer to Toilet: with min guard assist;ambulating ?Additional ADL Goal #1: Pt will demonstrate increased activity tolerance to complete at least 3 ADLs in standing with min G  ?Plan Discharge plan remains appropriate   ? ?Co-evaluation ? ? ? PT/OT/SLP Co-Evaluation/Treatment: Yes ?Reason for Co-Treatment: Complexity of the patient's impairments (multi-system involvement);For patient/therapist safety;To address functional/ADL transfers (increased lethargy and O2 needs) ?PT goals addressed during session: Mobility/safety with mobility;Balance;Strengthening/ROM ?OT goals addressed during session: ADL's and self-care;Strengthening/ROM ?  ? ?  ?AM-PAC OT "6 Clicks" Daily Activity     ?Outcome Measure ? ? Help from another person eating meals?: Total (NPO) ?Help from another person taking care  of personal grooming?: A Lot ?Help from another person toileting, which includes using toliet, bedpan, or urinal?: A Lot ?Help from another person bathing (including washing, rinsing, drying)?: A Lot ?Help from another person to put on and taking off regular upper body clothing?: A Little ?Help from another person to put on and  taking off regular lower body clothing?: A Lot ?6 Click Score: 12 ? ?  ?End of Session Equipment Utilized During Treatment: Gait belt;Oxygen (8L via HFNC) ? ?OT Visit Diagnosis: Unsteadiness on feet (R26.81);

## 2021-09-01 NOTE — Progress Notes (Signed)
IP rehab admissions - awaiting medical readiness prior to acute inpatient rehab admission.  Will continue to follow progress.  Call for questions.  [phone] ?

## 2021-09-01 NOTE — Progress Notes (Signed)
Patient was on 5L [place] at beginning of shift. Has been arousable and neurologically unchanged. At 0300 complained of restlessness and 10/10 headache despite getting tylenol @ 0045. Gave 0.25mg  dilaudid @0309  for headache. Patient now a bit more drowsy but arousable to respond to questions as before. O2 sats decreased to 85% and mouth breathing some so placed on ventimask @ 14L 55%. O2 sat only increased to 87% after 15 min. RT recommended place patient on [place]. Patient now on 12L [place] and O2 sat 93%. [place] notified of increased O2 requirement. Awaiting orders. ?

## 2021-09-02 ENCOUNTER — Inpatient Hospital Stay (HOSPITAL_COMMUNITY): Payer: Medicare Other

## 2021-09-02 DIAGNOSIS — G4089 Other seizures: Secondary | ICD-10-CM

## 2021-09-02 LAB — CBC WITH DIFFERENTIAL/PLATELET
Abs Immature Granulocytes: 0.21 10*3/uL — ABNORMAL HIGH (ref 0.00–0.07)
Basophils Absolute: 0 10*3/uL (ref 0.0–0.1)
Basophils Relative: 0 %
Eosinophils Absolute: 0 10*3/uL (ref 0.0–0.5)
Eosinophils Relative: 0 %
HCT: 34.6 % — ABNORMAL LOW (ref 36.0–46.0)
Hemoglobin: 11.3 g/dL — ABNORMAL LOW (ref 12.0–15.0)
Immature Granulocytes: 2 %
Lymphocytes Relative: 12 %
Lymphs Abs: 1.4 10*3/uL (ref 0.7–4.0)
MCH: 32.1 pg (ref 26.0–34.0)
MCHC: 32.7 g/dL (ref 30.0–36.0)
MCV: 98.3 fL (ref 80.0–100.0)
Monocytes Absolute: 0.9 10*3/uL (ref 0.1–1.0)
Monocytes Relative: 8 %
Neutro Abs: 9.2 10*3/uL — ABNORMAL HIGH (ref 1.7–7.7)
Neutrophils Relative %: 78 %
Platelets: 190 10*3/uL (ref 150–400)
RBC: 3.52 MIL/uL — ABNORMAL LOW (ref 3.87–5.11)
RDW: 12.8 % (ref 11.5–15.5)
WBC: 11.8 10*3/uL — ABNORMAL HIGH (ref 4.0–10.5)
nRBC: 0 % (ref 0.0–0.2)

## 2021-09-02 LAB — COMPREHENSIVE METABOLIC PANEL
ALT: 29 U/L (ref 0–44)
AST: 24 U/L (ref 15–41)
Albumin: 2.5 g/dL — ABNORMAL LOW (ref 3.5–5.0)
Alkaline Phosphatase: 139 U/L — ABNORMAL HIGH (ref 38–126)
Anion gap: 9 (ref 5–15)
BUN: 13 mg/dL (ref 8–23)
CO2: 25 mmol/L (ref 22–32)
Calcium: 8.6 mg/dL — ABNORMAL LOW (ref 8.9–10.3)
Chloride: 104 mmol/L (ref 98–111)
Creatinine, Ser: 0.65 mg/dL (ref 0.44–1.00)
GFR, Estimated: >60 mL/min (ref >60)
Glucose, Bld: 143 mg/dL — ABNORMAL HIGH (ref 70–99)
Potassium: 3.7 mmol/L (ref 3.5–5.1)
Sodium: 138 mmol/L (ref 135–145)
Total Bilirubin: 0.6 mg/dL (ref 0.3–1.2)
Total Protein: 5.9 g/dL — ABNORMAL LOW (ref 6.5–8.1)

## 2021-09-02 LAB — GLUCOSE, CAPILLARY
Glucose-Capillary: 118 mg/dL — ABNORMAL HIGH (ref 70–99)
Glucose-Capillary: 127 mg/dL — ABNORMAL HIGH (ref 70–99)
Glucose-Capillary: 133 mg/dL — ABNORMAL HIGH (ref 70–99)
Glucose-Capillary: 141 mg/dL — ABNORMAL HIGH (ref 70–99)
Glucose-Capillary: 142 mg/dL — ABNORMAL HIGH (ref 70–99)
Glucose-Capillary: 166 mg/dL — ABNORMAL HIGH (ref 70–99)

## 2021-09-02 LAB — METHYLMALONIC ACID, SERUM: Methylmalonic Acid, Quantitative: 141 nmol/L (ref 0–378)

## 2021-09-02 MED ORDER — POTASSIUM CHLORIDE CRYS ER 20 MEQ PO TBCR: 40.0000 meq | EXTENDED_RELEASE_TABLET | Freq: Once | ORAL | Status: DC

## 2021-09-02 MED ORDER — POTASSIUM CHLORIDE 20 MEQ PO PACK
40.0000 meq | PACK | Freq: Once | ORAL | Status: AC
  Administered 2021-09-02: 40 meq
  Filled 2021-09-02: qty 2

## 2021-09-02 MED ORDER — LEVETIRACETAM 100 MG/ML PO SOLN
1000.0000 mg | Freq: Two times a day (BID) | ORAL | Status: DC
Start: 2021-09-02 — End: 2021-09-12
  Administered 2021-09-02 – 2021-09-12 (×20): 1000 mg
  Filled 2021-09-02 (×20): qty 10

## 2021-09-02 NOTE — Progress Notes (Signed)
Speech Language Pathology Treatment: Dysphagia  ?Patient Details ?Name: Kayla Maynard ?MRN: [id number] ?DOB: 1955-11-12 ?Today's Date: 09/02/2021 ?Time: 9191-6606 ?SLP Time Calculation (min) (ACUTE ONLY): 20 min ? ?Assessment / Plan / Recommendation ?Clinical Impression ? Pt was asleep initially, but easily awakened and agreeable to at least a few bites of mac and cheese. Her mastication seems prolonged, in part because she does not completely clear her oral cavity, leaving small pieces of noodles in her mouth that she continues to chew. Attempted to provide more thorough assessment of oral cavity but pt did not allow SLP to get a better view. She was however agreeable to sips of thin liquids as a liquid wash. This appeared to reduce some of the oral residue that could be observed. Pt's son is present today and endorses difficulty chewing solids PTA, attributing this to a very remote broken jaw. Given the above as well as concern for esophageal component based on family report from previous date, will start with Dys 2 (finely chopped) solids and thin liquids with use of aspiration and esophageal precautions. Would also monitor oral cavity well and encourage intake when able to sit fully upright and maintain her level of alertness.  ?  ?HPI HPI: Ms. Moeser is a 66 y/o woman who presented with LOC and was found to have seizures in the ED. On head CT she was found to have [facility] & SDH. She had a L PICA aneurysm coiled and underwent L pterional craniotomy for evacuation of SDH, bone flap in abdomen. Intubated from 4/28-5/2. Failed [facility]. ?  ?   ?SLP Plan ? Continue with current plan of care ? ?  ?  ?Recommendations for follow up therapy are one component of a multi-disciplinary discharge planning process, led by the attending physician.  Recommendations may be updated based on patient status, additional functional criteria and insurance authorization. ?  ? ?Recommendations  ?Diet recommendations: Dysphagia 2 (fine  chop);Thin liquid ?Liquids provided via: Cup;Straw ?Medication Administration: Crushed with puree ?Supervision: Patient able to self feed;Full supervision/cueing for compensatory strategies ?Compensations: Slow rate;Small sips/bites;Follow solids with liquid ?Postural Changes and/or Swallow Maneuvers: Seated upright 90 degrees;Upright 30-60 min after meal  ?   ?    ?   ? ? ? ? Oral Care Recommendations: Oral care BID ?Follow Up Recommendations: Acute inpatient rehab (3hours/day) ?Assistance recommended at discharge: Frequent or constant Supervision/Assistance ?SLP Visit Diagnosis: Dysphagia, unspecified (R13.10) ?Plan: Continue with current plan of care ? ? ? ? ?  ?  ? ? ?Osie Bond., M.A. CCC-SLP ?Acute Rehabilitation Services ?Office [phone] ? ?Secure chat preferred ? ? ?09/02/2021, 10:07 AM ?

## 2021-09-02 NOTE — Progress Notes (Addendum)
? ?NAME:  Kayla Maynard, MRN:  [id number], DOB:  [birth year], LOS: 6 ?ADMISSION DATE:  08/27/2021, CONSULTATION DATE:  08/27/21 ?REFERRING MD:  Saintclair Halsted - NSGY, CHIEF COMPLAINT:  SAH, endotracheally intubated  ? ?History of Present Illness:  ?66 yo F PMH angina presented to [facility] 4/29 after loss of consciousness at home. Family actually started CPR per 911 advice though unclear if pulses were actually lost -- sounds like there was hypertonicity and myoclonus, more concerning for seizure. On EMS arrival pt awake, hypoglycemic, got d10, was awake ? ?In ED denied hx sz. Endorsed HA and neck pain, worse x 2 wk. Had 2 additional sz in ED. Intubated for airway protection. Found to have ICH -- SAH, SDH. Became bradycardic, Started on 3% and cleviprex  ? ?Transferred [facility] for further care ? ?Pertinent  Medical History  ?Angina  ? ?Significant Hospital Events: ?Including procedures, antibiotic start and stop dates in addition to other pertinent events   ?4/29  multiple seizures. Hypoglycemia (factitious). Intubated. Found to have SAH, SDH. 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  Dominant left posterior communicating artery patent ?4/29 Left-sided pterional craniotomy for evacuation of left-sided subdural hematoma with implantation of the bone flap in the left abdominal wall ?4/30 considerable improvement postintervention with near complete resolution of midline shift. ?5/1 weaned 11hrs but still too sleepy for extubation, Intracranial drain removed by NS this morning. Tmax 101 ?5/2 aline removed/ not working, more awake/ extubated  ?5/4 working with PT  ?5/5 working with LSP.  ? ?Interim History / Subjective:  ?NAEO c/o mild ongoing HA no worse from yesterday  ? ?Objective   ?Blood pressure (!) 141/53, pulse 76, temperature 98.6 ?F (37 ?C),  temperature source Oral, resp. rate 20, height 5\' 6"  (1.676 m), weight 88 kg, SpO2 96 %. ?   ?   ? ?Intake/Output Summary (Last 24 hours) at 09/02/2021 0915 ?Last data filed at 09/02/2021 0600 ?Gross per 24 hour  ?Intake 2792.87 ml  ?Output 4450 ml  ?Net -1657.13 ml  ? ?Filed Weights  ? 08/30/21 0500 08/31/21 0500 09/01/21 0315  ?Weight: 88 kg 88 kg 88 kg  ? ?Examination: ?General:  Ill appearing older adult F NAD  ?HEENT: Surgical site dressing intact. Anicteric sclera  ?Neuro: Lethargic, following commands  ?CV: rrr s1s2 no rgm  ?PULM: CTAb even unlabored.  ?GI:  soft ndnt + bowel sounds  ?Extremities: no acute deformity no cyanosis or clubbing  ?Skin: c/d/w  ? ? ?Resolved hospital problem list:  ? ? ?Assessment & Plan:  ? ? ?Acute respiratory failure with hypoxia  ?- extubated 5/2 ?-CXR with bilateral opacities 5/4 ? Aspiration based on hx dysphagia. Had incr in O2 need 5/3-4 night ?P ?-cont cefepime  ?- wean O2 for sats > 94% ?- aggressive pulm hygiene- IS/ mobilize w/ PT ? 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 ?Dysphagia  ?Inadequate PO intake  ?P ?-SLP ?-cortrak  ?-90 degree when taking POs. Thin liquid, straw ? ?Macrocytic anemia  ?-  PRN CBC  ? ?Hyperglycemia ?-SSI  ? ? ?Best Practice (right click and "Reselect all SmartList Selections" daily)  ? ?  Diet/type: NPO TF + PO with SLP ?DVT prophylaxis: prophylactic heparin   ?GI prophylaxis: PPI ?Lines: N/A ?Foley:  N/A ?Code Status:  full code ?Last date of multidisciplinary goals of care discussion-- pt and family updated at bedside 5/5 ? ?CRITICAL CARE ?Performed by: Cristal Generous ? ? ?Total critical care time: 36 minutes ? ?Critical care time was exclusive of separately billable procedures and treating other  patients. ?Critical care was necessary to treat or prevent imminent or life-threatening deterioration. ? ?Critical care was time spent personally by me on the following activities: development of treatment plan with patient and/or surrogate as well as nursing, discussions with consultants, evaluation of patient's response to treatment, examination of patient, obtaining history from patient or surrogate, ordering and performing treatments and interventions, ordering and review of laboratory studies, ordering and review of radiographic studies, pulse oximetry and re-evaluation of patient's condition. ? ?Eliseo Gum MSN, AGACNP-BC ?[facility] ?Amion for pager  ?09/02/2021, 10:57 AM ? ? ? ? ? ? ?

## 2021-09-02 NOTE — Progress Notes (Signed)
[facility] ADULT ICU REPLACEMENT PROTOCOL ? ? ?The patient does apply for the [facility] Adult ICU Electrolyte Replacment Protocol based on the criteria listed below:  ? ?1.Exclusion criteria: TCTS patients, ECMO patients, and Dialysis patients ?2. Is GFR >/= 30 ml/min? Yes.    ?Patient's GFR today is >60 ?3. Is SCr </= 2? Yes.   ?Patient's SCr is 0.65 mg/dL ?4. Did SCr increase >/= 0.5 in 24 hours? No. ?5.Pt's weight >40kg  Yes.   ?6. Abnormal electrolyte(s): K+ 3.7  ?7. Electrolytes replaced per protocol ?8.  Call MD STAT for K+ </= 2.5, Phos </= 1, or Mag </= 1 ?Physician:  n/a ? ?Melvern Banker 09/02/2021 6:07 AM ? ?

## 2021-09-02 NOTE — Progress Notes (Signed)
?  NEUROSURGERY PROGRESS NOTE  ? ?Pt seen and examined. Unchanged HA/dizziness. ? ?EXAM: ?Temp:  [97.8 ?F (36.6 ?C)-99.7 ?F (37.6 ?C)] 98.6 ?F (37 ?C) (05/05 0800) ?Pulse Rate:  [59-80] 66 (05/05 0900) ?Resp:  [15-28] 17 (05/05 0900) ?BP: (123-173)/(53-98) 137/70 (05/05 0900) ?SpO2:  [90 %-100 %] 96 % (05/05 0900) ?Intake/Output   ?   05/04 0701 ?05/05 0700 05/05 0701 ?05/06 0700  ? I.V. (mL/kg) 1611.7 (18.3) 144.4 (1.6)  ? NG/GT 1100   ? IV Piggyback 424.5 14.6  ? Total Intake(mL/kg) 3136.2 (35.6) 159 (1.8)  ? Urine (mL/kg/hr) 4450 (2.1)   ? Stool 0   ? Total Output 4450   ? Net -1313.8 +159  ?     ? Stool Occurrence 1 x   ?  ?Awake, alert, oriented ?fluent speech ?Good strength throughout ?Wounds c/d/i ? ?LABS: ?Lab Results  ?Component Value Date  ? CREATININE 0.65 09/02/2021  ? BUN 13 09/02/2021  ? NA 138 09/02/2021  ? K 3.7 09/02/2021  ? CL 104 09/02/2021  ? CO2 25 09/02/2021  ? ?Lab Results  ?Component Value Date  ? WBC 11.8 (H) 09/02/2021  ? HGB 11.3 (L) 09/02/2021  ? HCT 34.6 (L) 09/02/2021  ? MCV 98.3 09/02/2021  ? PLT 190 09/02/2021  ? ?TCD: ?Date POD PCO2 HCT BP   MCA ACA PCA OPHT SIPH VERT Basilar  ?4/30 ?CK         Right  ?Left  ? *  ?*  ? *  ?*  ? *  ?*  ? 30  ?37  ? *  ?*  ? 11  ?16  ? *  ?   ?  ?5/1,RS         Right  ?Left  ? *  ?69  ? *  ?-82  ? 27  ?*  ? 19  ?26  ? 38  ?41  ? *  ?*  ? *  ?   ?  ?5/3,rs         Right  ?Left  ? 108  ?95  ? -53  ?-35  ? *  ?*  ? 29  ?34  ? 50  ?53  ? -32  ?-23  ? *  ?   ?  ? ? ?IMPRESSION: ?- 66 y.o. female SAH d# 6 s/p coil embo L PCom aneurysm, neurologically stable, non-focal exam. No clinical or ultrasonographic concern for spasm currently. ?- SZ provoked by [facility] ?- presumed pneumonia ? ?PLAN: ?- Cont ICU observation for spasm ?- Cont Nimotop ?- TCD today ?- Cont Keppra ?- PT/OT/SLP evals ?- Cont abx for pneumonia per PCCM ? ?Reviewed the situation with the patient's som at bedside. All questions were answered. ? ?Lisbeth Renshaw, MD ?[facility]  ? ?

## 2021-09-02 NOTE — Progress Notes (Signed)
Transcranial Doppler ? ?Date POD PCO2 HCT BP  MCA ACA PCA OPHT SIPH VERT Basilar  ?4/30 ?CK     Right  ?Left  ? *  ?*  ? *  ?*  ? *  ?*  ? 30  ?37  ? *  ?*  ? 11  ?16  ? *  ?  ?  ?5/1,RS     Right  ?Left  ? *  ?69  ? *  ?-82  ? 27  ?*  ? 19  ?26  ? 38  ?41  ? *  ?*  ? *  ?  ?  ?5/3,RS     Right  ?Left  ? 108  ?95  ? -53  ?-35  ? *  ?*  ? 29  ?34  ? 50  ?53  ? -32  ?-23  ? *  ?  ?  ?5/5, ?RH     Right  ?Left  ? 80  ?97  ? -19  ?-21  ? 26  ?17  ? 19  ?24  ? 26  ?63  ? -36  ?-30  ? -13  ?  ?  ? ?     Right  ?Left  ?   ?  ?   ?  ?   ?  ?   ?  ?   ?  ?   ?  ?   ?  ?  ?     Right  ?Left  ?   ?  ?   ?  ?   ?  ?   ?  ?   ?  ?   ?  ?   ?  ?  ?     Right  ?Left  ?   ?  ?   ?  ?   ?  ?   ?  ?   ?  ?   ?  ?   ?  ?  ? ?MCA = Middle Cerebral Artery      OPHT = Opthalmic Artery     BASILAR = Basilar Artery   ?ACA = Anterior Cerebral Artery     SIPH = Carotid Siphon ?PCA = Posterior Cerebral Artery   VERT = Verterbral Artery                  ? ?Normal ?MCA = 62+\-12 ?ACA = 50+\-12 ?PCA = 42+\-23  ? ?Right Lindergaard Ratio 6.15, Left Lindergaard Ratio 4.85   ?(*)=not insonated due to poor windows. ? ? ?Jean Rosenthal, RDMS, RVT ? ? ? ? ? ?

## 2021-09-02 NOTE — Progress Notes (Signed)
Physical Therapy Treatment ?Patient Details ?Name: Kayla Maynard ?MRN: [id number] ?DOB: [birth year] ?Today's Date: 09/02/2021 ? ? ?History of Present Illness Ms. Schwenn is a 66 y/o woman who presented with LOC and was found to have seizures x3 in the ED. CT showed extensive subarachnoid hemorrhage bilaterally and large SDH L frontal/ parietal/ temporal. 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  PMH: angina, back surgery ? ?  ?PT Comments  ? ? Pt tolerates treatment well, seemingly less limited by headache. Pt incontinent of stool upon arrival, assisted with hygiene tasks. Pt later incontinent of stool with ambulation, limiting gait progression. Pt demonstrates some improvement in standing balance but remains unsteady and benefits from UE support to stabilize. Pt will benefit from continued acute PT services in an effort to restore independence. PT continues to recommend AIR admission.   ?Recommendations for follow up therapy are one component of a multi-disciplinary discharge planning process, led by the attending physician.  Recommendations may be updated based on patient status, additional functional criteria and insurance authorization. ? ?Follow Up Recommendations ? Acute inpatient rehab (3hours/day) ?  ?  ?Assistance Recommended at Discharge Intermittent Supervision/Assistance  ?Patient can return home with the following A lot of help with bathing/dressing/bathroom;Assistance with cooking/housework;Assist for transportation;Help with stairs or ramp for entrance;A little help with walking and/or transfers ?  ?Equipment Recommendations ? Rolling walker (2 wheels)  ?  ?Recommendations for Other Services   ? ? ?  ?Precautions / Restrictions Precautions ?Precautions: Fall ?Precaution Comments: L crani, bone flap in L abdominal wall, incontinent of stool ?Restrictions ?Weight Bearing Restrictions: No  ?  ? ?Mobility ? Bed Mobility ?Overal bed mobility: Needs Assistance ?Bed Mobility:  Rolling, Sidelying to Sit ?Rolling: Min guard ?Sidelying to sit: Min guard, HOB elevated ?  ?  ?  ?General bed mobility comments: use of bed rails ?  ? ?Transfers ?Overall transfer level: Needs assistance ?Equipment used: 1 person hand held assist ?Transfers: Sit to/from Stand, Bed to chair/wheelchair/BSC ?Sit to Stand: Min guard ?  ?Step pivot transfers: Min assist ?  ?  ?  ?General transfer comment: pt utilizing UE support of chair back to stand initially, performs step-pivot with bilateral hand hold. Pt with increased swau with mobility ?  ? ?Ambulation/Gait ?Ambulation/Gait assistance: Min assist ?Gait Distance (Feet): 12 Feet (distance limited by incontinence of stool) ?Assistive device: IV Pole ?Gait Pattern/deviations: Step-to pattern ?Gait velocity: reduced ?Gait velocity interpretation: <1.31 ft/sec, indicative of household ambulator ?  ?General Gait Details: pt with slowed step-to gait, increased postural sway in multiple directions ? ? ?Stairs ?  ?  ?  ?  ?  ? ? ?Wheelchair Mobility ?  ? ?Modified Rankin (Stroke Patients Only) ?Modified Rankin (Stroke Patients Only) ?Pre-Morbid Rankin Score: No symptoms ?Modified Rankin: Moderately severe disability ? ? ?  ?Balance Overall balance assessment: Needs assistance ?Sitting-balance support: No upper extremity supported, Feet supported ?Sitting balance-Leahy Scale: Fair ?  ?  ?Standing balance support: Single extremity supported, Bilateral upper extremity supported, Reliant on assistive device for balance ?Standing balance-Leahy Scale: Poor ?  ?  ?  ?  ?  ?  ?  ?  ?  ?  ?  ?  ?  ? ?  ?Cognition Arousal/Alertness: Awake/alert ?Behavior During Therapy: [facility] for tasks assessed/performed ?Overall Cognitive Status: Impaired/Different from baseline ?Area of Impairment: Memory, Attention, Safety/judgement, Awareness, Problem solving, Following commands ?  ?  ?  ?  ?  ?  ?  ?  ?  ?Current  Attention Level: Sustained ?Memory: Decreased short-term memory ?Following  Commands: Follows one step commands consistently ?Safety/Judgement: Decreased awareness of deficits, Decreased awareness of safety ?Awareness: Emergent ?Problem Solving: Slow processing, Difficulty sequencing ?  ?  ?  ? ?  ?Exercises   ? ?  ?General Comments General comments (skin integrity, edema, etc.): VSS on 8L HFNC ?  ?  ? ?Pertinent Vitals/Pain Pain Assessment ?Pain Assessment: Faces ?Faces Pain Scale: Hurts even more ?Pain Location: head ?Pain Descriptors / Indicators: Headache ?Pain Intervention(s): Limited activity within patient's tolerance  ? ? ?Home Living   ?  ?  ?  ?  ?  ?  ?  ?  ?  ?   ?  ?Prior Function    ?  ?  ?   ? ?PT Goals (current goals can now be found in the care plan section) Acute Rehab PT Goals ?Patient Stated Goal: return home ?Progress towards PT goals: Progressing toward goals ? ?  ?Frequency ? ? ? Min 4X/week ? ? ? ?  ?PT Plan Current plan remains appropriate  ? ? ?Co-evaluation   ?  ?  ?  ?  ? ?  ?AM-PAC PT "6 Clicks" Mobility   ?Outcome Measure ? Help needed turning from your back to your side while in a flat bed without using bedrails?: A Little ?Help needed moving from lying on your back to sitting on the side of a flat bed without using bedrails?: A Little ?Help needed moving to and from a bed to a chair (including a wheelchair)?: A Little ?Help needed standing up from a chair using your arms (e.g., wheelchair or bedside chair)?: A Little ?Help needed to walk in hospital room?: Total ?Help needed climbing 3-5 steps with a railing? : Total ?6 Click Score: 14 ? ?  ?End of Session Equipment Utilized During Treatment: Oxygen ?Activity Tolerance: Other (comment) (limited by incontinence of stool) ?Patient left: in chair;with call bell/phone within reach;with chair alarm set;with nursing/sitter in room;with family/visitor present ?Nurse Communication: Mobility status ?PT Visit Diagnosis: Unsteadiness on feet (R26.81);Pain;Difficulty in walking, not elsewhere classified (R26.2) ?Pain -  part of body:  (head) ?  ? ? ?Time: 2956-2130 ?PT Time Calculation (min) (ACUTE ONLY): 38 min ? ?Charges:  $Gait Training: 8-22 mins ?$Therapeutic Activity: 23-37 mins          ?          ? ?Arlyss Gandy, PT, DPT ?Acute Rehabilitation ?Pager: [phone] ?Office [phone] ? ? ? ?Arlyss Gandy ?09/02/2021, 1:02 PM ? ?

## 2021-09-03 ENCOUNTER — Inpatient Hospital Stay (HOSPITAL_COMMUNITY): Payer: Medicare Other

## 2021-09-03 LAB — GLUCOSE, CAPILLARY
Glucose-Capillary: 152 mg/dL — ABNORMAL HIGH (ref 70–99)
Glucose-Capillary: 136 mg/dL — ABNORMAL HIGH (ref 70–99)
Glucose-Capillary: 110 mg/dL — ABNORMAL HIGH (ref 70–99)
Glucose-Capillary: 93 mg/dL (ref 70–99)
Glucose-Capillary: 154 mg/dL — ABNORMAL HIGH (ref 70–99)
Glucose-Capillary: 112 mg/dL — ABNORMAL HIGH (ref 70–99)

## 2021-09-03 MED ORDER — LOPERAMIDE HCL 1 MG/7.5ML PO SUSP
2.0000 mg | ORAL | Status: DC | PRN
  Administered 2021-09-04 – 2021-09-06 (×4): 2 mg
  Filled 2021-09-03 (×5): qty 15

## 2021-09-03 MED ORDER — GERHARDT'S BUTT CREAM
TOPICAL_CREAM | CUTANEOUS | Status: DC | PRN
  Filled 2021-09-03 (×2): qty 1

## 2021-09-03 MED ORDER — HYDROMORPHONE HCL 1 MG/ML IJ SOLN
0.5000 mg | INTRAMUSCULAR | Status: DC | PRN
Start: 2021-09-03 — End: 2021-09-05
  Administered 2021-09-03 – 2021-09-05 (×10): 0.5 mg via INTRAVENOUS
  Filled 2021-09-03 (×10): qty 1

## 2021-09-03 MED ORDER — LOPERAMIDE HCL 1 MG/7.5ML PO SUSP: 4.0000 mg | Freq: Once | ORAL | Status: DC

## 2021-09-03 MED ORDER — OSMOLITE 1.5 CAL PO LIQD
600.0000 mL | ORAL | Status: DC
  Administered 2021-09-03 – 2021-09-04 (×2): 600 mL
  Filled 2021-09-03 (×4): qty 711

## 2021-09-03 MED ORDER — LOPERAMIDE HCL 1 MG/7.5ML PO SUSP
1.0000 mg | Freq: Once | ORAL | Status: AC
  Administered 2021-09-03: 1 mg
  Filled 2021-09-03: qty 7.5

## 2021-09-03 NOTE — Progress Notes (Signed)
NUTRITION NOTE ? ?Page received from RN due to concern for type 7 stools. Patient was last seen for a full assessment by a RD in person on 5/4; RD working remotely today. ? ?RN shares that patient is eating minimally, no changes in medications over the past 48 hours, and that patient had informed him that she is lactose intolerant. ? ?On 5/4 patient was having type 7 stools, TF formula was changed from Vital 1.5 to Osmolite 1.5 (which she is currently receiving), and fiber BID was started on 5/3. ? ?Encouraged RN to monitor and if stools worsen in the next 24-48 hours, to re-page on call pager. ? ? ? ? ?Trenton Gammon, MS, RD, LDN ?Registered Dietitian II ?Inpatient Clinical Nutrition ?RD pager # and on-call/weekend pager # available in AMION  ? ?

## 2021-09-03 NOTE — Progress Notes (Signed)
? ?NAME:  Kayla Maynard, MRN:  [id number], DOB:  Oct 25, 1955, LOS: 7 ?ADMISSION DATE:  08/27/2021, CONSULTATION DATE:  08/27/21 ?REFERRING MD:  Wynetta Emery - NSGY, CHIEF COMPLAINT:  SAH, endotracheally intubated  ? ?History of Present Illness:  ?66 yo F PMH angina presented to [facility] 4/29 after loss of consciousness at home. Family actually started CPR per 911 advice though unclear if pulses were actually lost -- sounds like there was hypertonicity and myoclonus, more concerning for seizure. On EMS arrival pt awake, hypoglycemic, got d10, was awake ? ?In ED denied hx sz. Endorsed HA and neck pain, worse x 2 wk. Had 2 additional sz in ED. Intubated for airway protection. Found to have ICH -- SAH, SDH. Became bradycardic, Started on 3% and cleviprex  ? ?[facility] care ? ?Pertinent  Medical History  ?Angina  ? ?Significant Hospital Events: ?Including procedures, antibiotic start and stop dates in addition to other pertinent events   ?4/29  multiple seizures. Hypoglycemia (factitious). Intubated. Found to have SAH, SDH. 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  Dominant left posterior communicating artery patent ?4/29 Left-sided pterional craniotomy for evacuation of left-sided subdural hematoma with implantation of the bone flap in the left abdominal wall ?4/30 considerable improvement postintervention with near complete resolution of midline shift. ?5/1 weaned 11hrs but still too sleepy for extubation, Intracranial drain removed by NS this morning. Tmax 101 ?5/2 aline removed/ not working, more awake/ extubated  ?5/4 working with PT  ?5/5 working with SLP.  Mild vasospasm on TCD ?5/6 adding immodium  ? ?Interim History / Subjective:  ? ?Soiling herself with stool ? ?Objective   ?Blood pressure (!) 160/74, pulse 71,  temperature 97.9 ?F (36.6 ?C), temperature source Oral, resp. rate 16, height 5\' 6"  (1.676 m), weight 88 kg, SpO2 97 %. ?   ?   ? ?Intake/Output Summary (Last 24 hours) at 09/03/2021 1048 ?Last data filed at 09/03/2021 0800 ?Gross per 24 hour  ?Intake 2882.77 ml  ?Output 3000 ml  ?Net -117.23 ml  ? ?Filed Weights  ? 08/30/21 0500 08/31/21 0500 09/01/21 0315  ?Weight: 88 kg 88 kg 88 kg  ? ?Examination: ?General: Ill appearing adult F reclined in bed NAD  ?HEENT: Surgical site c/d/I Anicteric sclera  ?Neuro: Awake alert following commands. Oriented x2  ?CV: rrr s1s2 no rgm  ?PULM: even unlabored on [place] ?GI:  soft ndnt  ?Extremities: no acute deformity no cyanosis or clubbing  ?Skin: c/d/w. Erythematous moist dermatitis groin  ? ? ?Resolved hospital problem list:  ? ? ?Assessment & Plan:  ? ?Acute respiratory failure with hypoxia  ?- extubated 5/2 ?-CXR with bilateral opacities 5/4 ? Aspiration based on hx dysphagia. Had incr in O2 need 5/3-4 night ?P ?-short course of cefepime ?-Wean O2 as able ?-mobility, pulm hygiene  ? 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 ?Dysphagia ?Inadequate PO intake  ?Diarrhea related to enteral nutrition  ?P ?-SLP ?-cortrak + EN  ?-90 degree when taking POs. Thin  liquid, straw ?-Adding imodium  ? ?Macrocytic anemia  ?-  PRN CBC  ? ?Hyperglycemia ?-SSI  ? ? ?Best Practice (right click and "Reselect all SmartList Selections" daily)  ? ?Diet/type: NPO TF + PO with SLP ?DVT prophylaxis: prophylactic heparin   ?GI prophylaxis: PPI ?Lines: N/A ?Foley:  N/A ?Code Status:  full code ?Last date of multidisciplinary goals of care discussion-- pt and  family updated at bedside 5/6 ? ? ?CRITICAL CARE ?Performed by: Lanier Clam ? ? ?Total critical care time: 36 minutes ? ?Critical care time was exclusive of separately billable procedures and treating other patients. ?Critical care was necessary to treat or prevent imminent or life-threatening deterioration. ? ?Critical care was time spent personally by me on the following activities: development of treatment plan with patient and/or surrogate as well as nursing, discussions with consultants, evaluation of patient's response to treatment, examination of patient, obtaining history from patient or surrogate, ordering and performing treatments and interventions, ordering and review of laboratory studies, ordering and review of radiographic studies, pulse oximetry and re-evaluation of patient's condition. ? ?Tessie Fass MSN, AGACNP-BC ?[facility] ?Amion for pager  ?09/03/2021, 10:48 AM ? ? ? ? ?

## 2021-09-03 NOTE — Progress Notes (Signed)
? ?  Providing Compassionate, Quality Care - Together ? ?NEUROSURGERY PROGRESS NOTE ? ? ?S: No issues overnight.  ? ?O: EXAM:  ?BP (!) 160/74 (BP Location: Right Arm)   Pulse 71   Temp 97.9 ?F (36.6 ?C)   Resp 16   Ht 5\' 6"  (1.676 m)   Wt 88 kg   SpO2 97%   BMI 31.31 kg/m?  ? ?Awake, alert, oriented x3 ?PERRL ?Speech fluent, appropriate  ?CNs grossly intact  ?5/5 BUE/BLE  ?Incision clean dry and intact ? ?ASSESSMENT:  ?66 y.o. female with  ? ?Aneurysmal subarachnoid hemorrhage, SAH D7, left P-comm ? ?-Status post coil embolization of aneurysm; left craniectomy evacuation of subdural hematoma ? ?PLAN: ?-Continue supportive care, subarachnoid hemorrhage pathway ?-Neurologically doing well ?-Monitor for vasospasm, no evidence of significant vasospasm on TCD's at this time ?-Updated family at bedside ? ? ?Thank you for allowing me to participate in this patient's care.  Please do not hesitate to call with questions or concerns. ? ? ?Lister Brizzi, DO ?Neurosurgeon ?[facility] ?Cell: [phone] ? ?

## 2021-09-03 NOTE — Plan of Care (Signed)
?  Problem: Education: ?Goal: Knowledge of disease or condition will improve ?Outcome: Progressing ?Goal: Knowledge of secondary prevention will improve (SELECT ALL) ?Outcome: Progressing ?Goal: Knowledge of patient specific risk factors will improve (INDIVIDUALIZE FOR PATIENT) ?Outcome: Progressing ?  ?

## 2021-09-04 DIAGNOSIS — T17908A Unspecified foreign body in respiratory tract, part unspecified causing other injury, initial encounter: Secondary | ICD-10-CM

## 2021-09-04 DIAGNOSIS — J9601 Acute respiratory failure with hypoxia: Secondary | ICD-10-CM

## 2021-09-04 LAB — GLUCOSE, CAPILLARY
Glucose-Capillary: 137 mg/dL — ABNORMAL HIGH (ref 70–99)
Glucose-Capillary: 97 mg/dL (ref 70–99)
Glucose-Capillary: 121 mg/dL — ABNORMAL HIGH (ref 70–99)
Glucose-Capillary: 95 mg/dL (ref 70–99)
Glucose-Capillary: 134 mg/dL — ABNORMAL HIGH (ref 70–99)
Glucose-Capillary: 110 mg/dL — ABNORMAL HIGH (ref 70–99)

## 2021-09-04 LAB — BASIC METABOLIC PANEL
Anion gap: 8 (ref 5–15)
BUN: 19 mg/dL (ref 8–23)
CO2: 26 mmol/L (ref 22–32)
Calcium: 8.9 mg/dL (ref 8.9–10.3)
Chloride: 101 mmol/L (ref 98–111)
Creatinine, Ser: 0.49 mg/dL (ref 0.44–1.00)
GFR, Estimated: >60 mL/min (ref >60)
Glucose, Bld: 125 mg/dL — ABNORMAL HIGH (ref 70–99)
Potassium: 4.1 mmol/L (ref 3.5–5.1)
Sodium: 135 mmol/L (ref 135–145)

## 2021-09-04 LAB — CBC
HCT: 38.4 % (ref 36.0–46.0)
Hemoglobin: 12.5 g/dL (ref 12.0–15.0)
MCH: 32.1 pg (ref 26.0–34.0)
MCHC: 32.6 g/dL (ref 30.0–36.0)
MCV: 98.5 fL (ref 80.0–100.0)
Platelets: 240 10*3/uL (ref 150–400)
RBC: 3.9 MIL/uL (ref 3.87–5.11)
RDW: 12.4 % (ref 11.5–15.5)
WBC: 13.3 10*3/uL — ABNORMAL HIGH (ref 4.0–10.5)
nRBC: 0 % (ref 0.0–0.2)

## 2021-09-04 MED ORDER — OSMOLITE 1.5 CAL PO LIQD
1000.0000 mL | ORAL | Status: AC
  Administered 2021-09-05: 600 mL

## 2021-09-04 NOTE — Progress Notes (Signed)
? ?  Providing Compassionate, Quality Care - Together ? ?NEUROSURGERY PROGRESS NOTE ? ? ?S: No issues overnight. ? ?O: EXAM:  ?BP (!) 143/77   Pulse 60   Temp 98.6 ?F (37 ?C) (Oral)   Resp 11   Ht 5\' 6"  (1.676 m)   Wt 83.3 kg   SpO2 94%   BMI 29.64 kg/m?  ? ?Opens eyes to voice, oriented x 3 ?PERRL ?Speech fluent, appropriate  ?CNs grossly intact  ?5/5 BUE/BLE  ?Incision clean dry and intact ?  ?ASSESSMENT:  ?66 y.o. female with  ?  ?Aneurysmal subarachnoid hemorrhage, SAH D8, left P-comm ?  ?-Status post coil embolization of aneurysm; left craniectomy evacuation of subdural hematoma ?  ?PLAN: ?-Continue supportive care, subarachnoid hemorrhage pathway ?-Neurologically doing well ?-Updated family at bedside ?  ? ? ?Thank you for allowing me to participate in this patient's care.  Please do not hesitate to call with questions or concerns. ? ? ?Julieana Eshleman, DO ?Neurosurgeon ?[facility] ?Cell: [phone] ? ?

## 2021-09-04 NOTE — Progress Notes (Signed)
? ?NAME:  Kayla Maynard, MRN:  [id number], DOB:  1955-05-15, LOS: 8 ?ADMISSION DATE:  08/27/2021, CONSULTATION DATE:  08/27/21 ?REFERRING MD:  Wynetta Emery - NSGY, CHIEF COMPLAINT:  SAH, endotracheally intubated  ? ?History of Present Illness:  ?66 yo F PMH angina presented to [facility] 4/29 after loss of consciousness at home. Family actually started CPR per 911 advice though unclear if pulses were actually lost -- sounds like there was hypertonicity and myoclonus, more concerning for seizure. On EMS arrival pt awake, hypoglycemic, got d10, was awake ? ?In ED denied hx sz. Endorsed HA and neck pain, worse x 2 wk. Had 2 additional sz in ED. Intubated for airway protection. Found to have ICH -- SAH, SDH. Became bradycardic, Started on 3% and cleviprex  ? ?[facility] care ? ?Pertinent  Medical History  ?Angina  ? ?Significant Hospital Events: ?Including procedures, antibiotic start and stop dates in addition to other pertinent events   ?4/29  multiple seizures. Hypoglycemia (factitious). Intubated. Found to have SAH, SDH. 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  Dominant left posterior communicating artery patent ?4/29 Left-sided pterional craniotomy for evacuation of left-sided subdural hematoma with implantation of the bone flap in the left abdominal wall ?4/30 considerable improvement postintervention with near complete resolution of midline shift. ?5/1 weaned 11hrs but still too sleepy for extubation, Intracranial drain removed by NS this morning. Tmax 101 ?5/2 aline removed/ not working, more awake/ extubated  ?5/4 working with PT  ?5/5 working with SLP.  Mild vasospasm on TCD ?5/6 adding immodium  ?5/7 diarrhea improved, no overnight events ? ?Interim History / Subjective:  ? ? ?Taking a few bites of food, but poor  appetite ?Diarrhea improved  ? ?Objective   ?Blood pressure (!) 143/77, pulse 60, temperature 98.6 ?F (37 ?C), resp. rate 11, height 5\' 6"  (1.676 m), weight 83.3 kg, SpO2 94 %. ?   ?   ? ?Intake/Output Summary (Last 24 hours) at 09/04/2021 0931 ?Last data filed at 09/04/2021 0800 ?Gross per 24 hour  ?Intake 2705.61 ml  ?Output 2800 ml  ?Net -94.39 ml  ? ? ?Filed Weights  ? 08/31/21 0500 09/01/21 0315 09/04/21 0500  ?Weight: 88 kg 88 kg 83.3 kg  ? ? ?General:  ill-appearing F, awake and in no distress ?HEENT: MM pink/moist, sclera anicteric ?Neuro: alert and oriented x2, moving extremities to command and answering questions ?CV: s1s2 rrr, no m/r/g ?PULM:  no distress on nasal cannula, equal chest rise ?GI: soft, bsx4 active , non-tender ?Extremities: warm/dry, no edema  ?Skin: no rashes or lesions ? ? ? ? ? ?Resolved hospital problem list:  ? ? ?Assessment & Plan:  ? ?Acute respiratory failure with hypoxia  ?- extubated 5/2 ?-CXR with bilateral opacities 5/4 ? Aspiration based on hx dysphagia. Had incr in O2 need 5/3-4 night ?P ?-short course of cefepime, day #4 today ?-Wean O2 as able ?-mobility, pulm hygiene  ? 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 ?Dysphagia ?Inadequate PO intake  ?Diarrhea  related to enteral nutrition  ?Imodium added, diarrhea better ?P ?-TF changed to nocturnal only  ?-SLP ?-cortrak + EN  ?-90 degree when taking POs. Thin liquid, straw ? ? ?Macrocytic anemia  ?-  PRN CBC  ? ?Hyperglycemia ?-SSI  ? ? ?Best Practice (right click and "Reselect all SmartList Selections" daily)  ? ?Diet/type: NPO TF + PO with po's as tolerated ?DVT  prophylaxis: prophylactic heparin   ?GI prophylaxis: PPI ?Lines: N/A ?Foley:  N/A ?Code Status:  full code ?Last date of multidisciplinary goals of care discussion-- pt and family updated at bedside 5/7 ? ? ?CRITICAL CARE ?Performed by: Darcella Gasman Raife Lizer ? ? ?Total critical care time: 32 minutes ? ?Critical care time was exclusive of separately billable procedures and treating other patients. ?Critical care was necessary to treat or prevent imminent or life-threatening deterioration. ? ?Critical care was time spent personally by me on the following activities: development of treatment plan with patient and/or surrogate as well as nursing, discussions with consultants, evaluation of patient's response to treatment, examination of patient, obtaining history from patient or surrogate, ordering and performing treatments and interventions, ordering and review of laboratory studies, ordering and review of radiographic studies, pulse oximetry and re-evaluation of patient's condition. ? ?Darcella Gasman Kaytlan Behrman, PA-C ?[facility] ?See Amion for pager ?If no response to pager , please call [phone] until 7pm ?After 7:00 pm call Elink  [phone] ? ? ? ? ?

## 2021-09-04 NOTE — Plan of Care (Signed)
?  Problem: Education: ?Goal: Knowledge of patient specific risk factors will improve (INDIVIDUALIZE FOR PATIENT) ?Outcome: Progressing ?  ?Problem: Self-Care: ?Goal: Ability to participate in self-care as condition permits will improve ?Outcome: Progressing ?  ?Problem: Spontaneous Subarachnoid Hemorrhage Tissue Perfusion: ?Goal: Complications of Spontaneous Subarachnoid Hemorrhage will be minimized ?Outcome: Progressing ?  ?

## 2021-09-05 ENCOUNTER — Inpatient Hospital Stay (HOSPITAL_COMMUNITY): Payer: Medicare Other

## 2021-09-05 DIAGNOSIS — R1310 Dysphagia, unspecified: Secondary | ICD-10-CM

## 2021-09-05 DIAGNOSIS — T17908A Unspecified foreign body in respiratory tract, part unspecified causing other injury, initial encounter: Secondary | ICD-10-CM

## 2021-09-05 LAB — BASIC METABOLIC PANEL
Anion gap: 9 (ref 5–15)
BUN: 17 mg/dL (ref 8–23)
CO2: 27 mmol/L (ref 22–32)
Calcium: 8.8 mg/dL — ABNORMAL LOW (ref 8.9–10.3)
Chloride: 100 mmol/L (ref 98–111)
Creatinine, Ser: 0.6 mg/dL (ref 0.44–1.00)
GFR, Estimated: >60 mL/min (ref >60)
Glucose, Bld: 164 mg/dL — ABNORMAL HIGH (ref 70–99)
Potassium: 4.3 mmol/L (ref 3.5–5.1)
Sodium: 136 mmol/L (ref 135–145)

## 2021-09-05 LAB — CBC
HCT: 38.3 % (ref 36.0–46.0)
Hemoglobin: 12.7 g/dL (ref 12.0–15.0)
MCH: 32.5 pg (ref 26.0–34.0)
MCHC: 33.2 g/dL (ref 30.0–36.0)
MCV: 98 fL (ref 80.0–100.0)
Platelets: 278 10*3/uL (ref 150–400)
RBC: 3.91 MIL/uL (ref 3.87–5.11)
RDW: 12.3 % (ref 11.5–15.5)
WBC: 15.2 10*3/uL — ABNORMAL HIGH (ref 4.0–10.5)
nRBC: 0 % (ref 0.0–0.2)

## 2021-09-05 LAB — GLUCOSE, CAPILLARY
Glucose-Capillary: 110 mg/dL — ABNORMAL HIGH (ref 70–99)
Glucose-Capillary: 113 mg/dL — ABNORMAL HIGH (ref 70–99)
Glucose-Capillary: 123 mg/dL — ABNORMAL HIGH (ref 70–99)
Glucose-Capillary: 129 mg/dL — ABNORMAL HIGH (ref 70–99)
Glucose-Capillary: 154 mg/dL — ABNORMAL HIGH (ref 70–99)
Glucose-Capillary: 183 mg/dL — ABNORMAL HIGH (ref 70–99)

## 2021-09-05 MED ORDER — HYDROCODONE-ACETAMINOPHEN 10-325 MG PO TABS
1.0000 | ORAL_TABLET | ORAL | Status: DC | PRN
  Administered 2021-09-05 – 2021-09-11 (×15): 1 via ORAL
  Filled 2021-09-05 (×15): qty 1

## 2021-09-05 NOTE — Progress Notes (Addendum)
? ? ?Referring Physician(s): Code stroke/CCM ? ?Supervising Physician: Julieanne Cottoneveshwar, Sanjeev ? ?[facility] ? 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 ?Subjective: ? ?Patient seen with Dr. Corliss Skainseveshwar.  ?Patient sitting in bed, NAD. Sister and RN at bedside.  ?Sister state that patient had been able to tolerate po intake.  ?Patient just got pain medicine and she has been more sleepy since then per sister.  ?RN states that patient was able to tell her name and her age, she also stated that she is at the "place where help people." ? ? ?Allergies: ?Morphine and Penicillins ? ?Medications: ?Prior to Admission medications   ?Medication Sig Start Date End Date Taking? Authorizing Provider  ?Magnesium 200 MG TABS Take 1 tablet by mouth daily.   Yes [provider]  ?nitroGLYCERIN (NITROSTAT) 0.4 MG SL tablet Place 0.4 mg under the tongue every 5 (five) minutes as needed for chest pain.   Yes [provider]  ?OVER THE COUNTER MEDICATION Take 1 tablet by mouth daily. Super Beets   Yes [provider]  ?zinc gluconate 50 MG tablet Take 50 mg by mouth daily.   Yes [provider]  ? ? ? ?Vital Signs: ?BP (!) 155/71 (BP Location: Right Arm)   Pulse 73   Temp 98.3 ?F (36.8 ?C) (Axillary)   Resp 14   Ht 5\' 6"  (1.676 m)   Wt 183 lb 13.8 oz (83.4 kg)   SpO2 93%   BMI 29.68 kg/m?  ? ?Physical Exam ?Vitals and nursing note reviewed.  ?Constitutional:   ?   Comments: Appears sleepy, arouses with verbal stimuli but goes back to sleep   ?HENT:  ?   Head:  ?   Comments: (+) bandages present on left head ?Eyes:  ?   Comments: No swelling on left eye lid noted   ?Cardiovascular:  ?   Rate and Rhythm: Normal rate.  ?   Comments:   ?Pulmonary:  ?   Effort: Pulmonary effort is normal.  ?   Comments: 4L O2 via [place]  ?Abdominal:  ?   General: Abdomen is flat.  ?   Palpations: Abdomen is soft.  ?Skin: ?   General: Skin is warm and dry.  ?Neurological:  ?    Comments: Moves all 4 ext purposefully.  ?Oriented to self, time, and place per RN.   ? ? ? ?Imaging: ?DG CHEST PORT 1 VIEW ? ?Result Date: 09/02/2021 ?CLINICAL DATA:  Evaluate pleural effusions, airspace disease, pneumonia EXAM: PORTABLE CHEST 1 VIEW COMPARISON:  Chest radiograph 1 day prior FINDINGS: The enteric catheter tip is off the field of view. The cardiomediastinal silhouette is stable. The bilateral pleural effusions have decreased in size, with improved aeration of the lung bases. There is no new or worsening focal airspace disease. There is no pneumothorax There is no acute osseous abnormality. IMPRESSION: Decreased size of the bilateral pleural effusions with improved aeration of the lung bases. No new or worsening focal airspace disease. Electronically Signed   By: Lesia HausenPeter  Noone M.D.   On: 09/02/2021 08:16  ? ?VAS US TRANSCRANIAL DOPPLER [facility]) ? ?Result Date: 09/02/2021 ? Transcranial Doppler Patient Name:  Kayla AntiguaERESA Maynard  Date of Exam:   09/02/2021 Medical Rec #: [id number]        Accession #:    [id number] Date of Birth: 1955-09-03        Patient Gender: F Patient Age:   [age] years  Exam Location:  [facility] Procedure:      VAS [place] TRANSCRANIAL DOPPLER Referring Phys: Donalee Citrin --------------------------------------------------------------------------------  Indications: Subarachnoid hemorrhage. History: Left PCA aneurysm, ICH, SDH. Limitations for diagnostic windows: Unable to insonate right transtemporal window. Comparison Study: Most recent prior study 08-31-2021 Performing Technologist: Jean Rosenthal RDMS, RVT  Examination Guidelines: A complete evaluation includes B-mode imaging, spectral Doppler, color Doppler, and power Doppler as needed of all accessible portions of each vessel. Bilateral testing is considered an integral part of a complete examination. Limited examinations for reoccurring indications may be performed as noted.   +----------+-------------+----------+-----------+------------------+ RIGHT TCD Right VM (cm)Depth (cm)Pulsatility     Comment       +----------+-------------+----------+-----------+------------------+ MCA           80.00                 1.17      Contralateral    +----------+-------------+----------+-----------+------------------+ ACA          -19.00                 1.18      Contralateral    +----------+-------------+----------+-----------+------------------+ Term ICA                                    Unable to insonate +----------+-------------+----------+-----------+------------------+ PCA           26.00                 1.45      Contralateral    +----------+-------------+----------+-----------+------------------+ Opthalmic     19.00                 1.42                       +----------+-------------+----------+-----------+------------------+ ICA siphon    26.00                 1.39                       +----------+-------------+----------+-----------+------------------+ Vertebral    -36.00                 1.63                       +----------+-------------+----------+-----------+------------------+ Distal ICA    13.00                 1.39                       +----------+-------------+----------+-----------+------------------+  +----------+------------+----------+-----------+-------+ LEFT TCD  Left VM (cm)Depth (cm)PulsatilityComment +----------+------------+----------+-----------+-------+ MCA          97.00                 1.40            +----------+------------+----------+-----------+-------+ ACA          -21.00                1.46            +----------+------------+----------+-----------+-------+ Term ICA     95.00                 1.37            +----------+------------+----------+-----------+-------+ PCA          17.00  1.20            +----------+------------+----------+-----------+-------+ Opthalmic     24.00                 1.86            +----------+------------+----------+-----------+-------+ ICA siphon   63.00                 1.55            +----------+------------+----------+-----------+-------+ Vertebral    -30.00                1.12            +----------+------------+----------+-----------+-------+ Distal ICA   20.00                 1.55            +----------+------------+----------+-----------+-------+  +------------+------+-------+             VM cm Comment +------------+------+-------+ Prox Basilar-13.00        +------------+------+-------+ +----------------------+----+ Right Lindegaard Ratio6.15 +----------------------+----+ +---------------------+----+ Left Lindegaard Ratio4.85 +---------------------+----+  Summary:  Elevetaed left middle cerebral artery suggestive of mild vasospasm. Slightly elevated right middle,cerebral artery and low basilar artery mean flow velocities of unclear significance. *See table(s) above for TCD measurements and observations.  Diagnosing physician: Delia Heady MD Electronically signed by Delia Heady MD on 09/02/2021 at 8:40:55 PM.    Final    ? ?Labs: ? ?CBC: ?Recent Labs  ?  09/01/21 ?0359 09/02/21 ?0413 09/04/21 ?0242 09/05/21 ?5643  ?WBC 12.2* 11.8* 13.3* 15.2*  ?HGB 10.0* 11.3* 12.5 12.7  ?HCT 30.6* 34.6* 38.4 38.3  ?PLT 158 190 240 278  ? ? ? ?COAGS: ?Recent Labs  ?  08/27/21 ?1900  ?INR 1.3*  ? ? ? ?BMP: ?Recent Labs  ?  09/01/21 ?0359 09/02/21 ?0413 09/04/21 ?0242 09/05/21 ?3295  ?NA 140 138 135 136  ?K 3.8 3.7 4.1 4.3  ?CL 109 104 101 100  ?CO2 23 25 26 27   ?GLUCOSE 144* 143* 125* 164*  ?BUN 13 13 19 17   ?CALCIUM 8.2* 8.6* 8.9 8.8*  ?CREATININE 0.53 0.65 0.49 0.60  ?GFRNONAA >60 >60 >60 >60  ? ? ? ?LIVER FUNCTION TESTS: ?Recent Labs  ?  08/30/21 ?0358 08/31/21 ?10/30/21 09/01/21 ?1884 09/02/21 ?0413  ?BILITOT 0.8 0.5 0.6 0.6  ?AST 17 20 22 24   ?ALT 17 20 23 29   ?ALKPHOS 72 94 121 139*  ?PROT 4.9* 5.2* 5.4* 5.9*  ?ALBUMIN 2.4*  2.4* 2.3* 2.5*  ? ? ? ?Assessment and Plan: ? ?66 y/o F found unresponsive at home followed by 3 episodes of jerking movements on the morning of 4/29 brought to [facility] ED for further evaluation. CT brain showed diffuse SAH primarily in the left perisylvian and left perimesencephalic subarachnoid space associated with a prominent acute left cerebral convexity subdural hematoma asso

## 2021-09-05 NOTE — Progress Notes (Signed)
?  NEUROSURGERY PROGRESS NOTE  ? ?Pt seen and examined. No significant c/o currently. ? ?EXAM: ?Temp:  [98.1 ?F (36.7 ?C)-99.1 ?F (37.3 ?C)] 98.2 ?F (36.8 ?C) (05/08 1148) ?Pulse Rate:  [65-89] 81 (05/08 1200) ?Resp:  [8-19] 16 (05/08 1200) ?BP: (75-164)/(46-90) 131/88 (05/08 1200) ?SpO2:  [91 %-98 %] 91 % (05/08 1200) ?Weight:  [83.4 kg] 83.4 kg (05/08 0500) ?Intake/Output   ?   05/07 0701 ?05/08 0700 05/08 0701 ?05/09 0700  ? I.V. (mL/kg) 1668.2 (20) 365.4 (4.4)  ? NG/GT 525 59.2  ? IV Piggyback 400   ? Total Intake(mL/kg) 2593.2 (31.1) 424.6 (5.1)  ? Urine (mL/kg/hr) 2350 (1.2)   ? Stool 0   ? Total Output 2350   ? Net +243.2 +424.6  ?     ? Urine Occurrence 4 x 2 x  ? Stool Occurrence 2 x 1 x  ?  ?Awake, alert, oriented ?fluent speech ?Good strength throughout ?Wounds c/d/i ? ?LABS: ?Lab Results  ?Component Value Date  ? CREATININE 0.60 09/05/2021  ? BUN 17 09/05/2021  ? NA 136 09/05/2021  ? K 4.3 09/05/2021  ? CL 100 09/05/2021  ? CO2 27 09/05/2021  ? ?Lab Results  ?Component Value Date  ? WBC 15.2 (H) 09/05/2021  ? HGB 12.7 09/05/2021  ? HCT 38.3 09/05/2021  ? MCV 98.0 09/05/2021  ? PLT 278 09/05/2021  ? ?TCD: ?Date POD PCO2 HCT BP   MCA ACA PCA OPHT SIPH VERT Basilar  ?4/30 ?CK         Right  ?Left  ? *  ?*  ? *  ?*  ? *  ?*  ? 30  ?37  ? *  ?*  ? 11  ?16  ? *  ?   ?  ?5/1,RS         Right  ?Left  ? *  ?69  ? *  ?-82  ? 27  ?*  ? 19  ?26  ? 38  ?41  ? *  ?*  ? *  ?   ?  ?5/3,RS         Right  ?Left  ? 108  ?95  ? -53  ?-35  ? *  ?*  ? 29  ?34  ? 50  ?53  ? -32  ?-23  ? *  ?   ?  ?5/5, ?RH         Right  ?Left  ? 80  ?97  ? -19  ?-21  ? 26  ?17  ? 19  ?24  ? 26  ?63  ? -36  ?-30  ? -13  ?   ?  ? ? ?IMPRESSION: ?- 66 y.o. female [place] d# 9 s/p coil embo L PCom aneurysm, neurologically stable, non-focal exam. No clinical or ultrasonographic concern for spasm. ?- SZ provoked by [facility] ?- presumed pneumonia ? ?PLAN: ?- Cont ICU observation for spasm ?- Cont Nimotop ?- TCD today ?- Cont Keppra ?- Cont therapy ?- Cont abx  for pneumonia per PCCM ? ?Reviewed the situation with the patient's sister at bedside. All questions were answered. ? ?Consuella Lose, MD ?[facility]  ? ?

## 2021-09-05 NOTE — Progress Notes (Signed)
Physical Therapy Treatment ?Patient Details ?Name: Kayla Maynard ?MRN: [id number] ?DOB: June 04, 1955 ?Today's Date: 09/05/2021 ? ? ?History of Present Illness Ms. Convery is a 66 y/o woman who presented with LOC and was found to have seizures x3 in the ED. CT showed extensive subarachnoid hemorrhage bilaterally and large SDH L frontal/ parietal/ temporal. 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  PMH: angina, back surgery ? ?  ?PT Comments  ? ? Pt admitted with above diagnosis. Pt was able to incr distance with ambulation needing min assist and mod cues for increasing ambulation to hallway. Pt requires assist and bil UE support for balance as she is unsteady at times.  STill needs AIR.  Will continue to progress pt acutely.  Pt currently with functional limitations due to balance and endurance deficits. Pt will benefit from skilled PT to increase their independence and safety with mobility to allow discharge to the venue listed below.      ?Recommendations for follow up therapy are one component of a multi-disciplinary discharge planning process, led by the attending physician.  Recommendations may be updated based on patient status, additional functional criteria and insurance authorization. ? ?Follow Up Recommendations ? Acute inpatient rehab (3hours/day) ?  ?  ?Assistance Recommended at Discharge Intermittent Supervision/Assistance  ?Patient can return home with the following A lot of help with bathing/dressing/bathroom;Assistance with cooking/housework;Assist for transportation;Help with stairs or ramp for entrance;A little help with walking and/or transfers ?  ?Equipment Recommendations ? Rolling walker (2 wheels)  ?  ?Recommendations for Other Services Rehab consult ? ? ?  ?Precautions / Restrictions Precautions ?Precautions: Fall ?Precaution Comments: L crani, bone flap in L abdominal wall, incontinent of stool ?Restrictions ?Weight Bearing Restrictions: No  ?  ? ?Mobility ? Bed  Mobility ?Overal bed mobility: Needs Assistance ?Bed Mobility: Rolling, Sidelying to Sit ?Rolling: Min guard ?Sidelying to sit: Min guard, HOB elevated ?Supine to sit: Min assist, HOB elevated ?  ?  ?General bed mobility comments: use of bed rails, cues to slow down ?  ? ?Transfers ?Overall transfer level: Needs assistance ?Equipment used: 2 person hand held assist ?Transfers: Sit to/from Stand, Bed to chair/wheelchair/BSC ?Sit to Stand: Min guard ?  ?Step pivot transfers: Min assist ?  ?  ?  ?General transfer comment: step-pivot with bilateral hand hold. Pt with increased sway with mobility. Needs steadying support and cues to slow down ?  ? ?Ambulation/Gait ?Ambulation/Gait assistance: Min assist, Mod assist, +2 safety/equipment ?Gait Distance (Feet): 180 Feet ?Assistive device: IV Pole ?Gait Pattern/deviations: Step-through pattern, Decreased stride length, Scissoring, Narrow base of support, Drifts right/left ?Gait velocity: reduced ?Gait velocity interpretation: <1.31 ft/sec, indicative of household ambulator ?  ?General Gait Details: pt with slowed step-to gait increasing to step through with cues.  Needed mod cues, constant cues for sequencing and occasionally scissors with narrow BOS with cues to widen stance.   increased postural sway in multiple directions even with bil hands on IV pole. Definite need for use of bil hands for support as pt unsteady without UE support.  Used 3N1 initially and PT left pt on 3N1 on departure as she is having loose stools. Nurse aware and in room with pt on PT departure.  Followed pt with chair but pt didnt need to rest. ? ? ?Stairs ?  ?  ?  ?  ?  ? ? ?Wheelchair Mobility ?  ? ?Modified Rankin (Stroke Patients Only) ?Modified Rankin (Stroke Patients Only) ?Pre-Morbid Rankin Score: No symptoms ?Modified Rankin: Moderately  severe disability ? ? ?  ?Balance Overall balance assessment: Needs assistance ?Sitting-balance support: No upper extremity supported, Feet supported ?Sitting  balance-Leahy Scale: Fair ?Sitting balance - Comments: intermittent UE support for static sitting ?  ?Standing balance support: Single extremity supported, Bilateral upper extremity supported, Reliant on assistive device for balance ?Standing balance-Leahy Scale: Poor ?Standing balance comment: reliant on BUE support and minA ?  ?  ?  ?  ?  ?  ?  ?  ?  ?  ?  ?  ? ?  ?Cognition Arousal/Alertness: Awake/alert ?Behavior During Therapy: [facility] for tasks assessed/performed ?Overall Cognitive Status: Impaired/Different from baseline ?Area of Impairment: Memory, Attention, Safety/judgement, Awareness, Problem solving, Following commands ?  ?  ?  ?  ?  ?  ?  ?  ?  ?Current Attention Level: Sustained ?Memory: Decreased short-term memory ?Following Commands: Follows one step commands consistently ?Safety/Judgement: Decreased awareness of deficits, Decreased awareness of safety ?Awareness: Emergent ?Problem Solving: Slow processing, Difficulty sequencing ?General Comments: pt a bit impulsive for mobilizing. vc for safety. follows most commands with increased time. ?  ?  ? ?  ?Exercises   ? ?  ?General Comments General comments (skin integrity, edema, etc.): VSS on RA ?  ?  ? ?Pertinent Vitals/Pain Pain Assessment ?Pain Assessment: Faces ?Faces Pain Scale: Hurts little more ?Pain Location: head ?Pain Descriptors / Indicators: Headache ?Pain Intervention(s): Limited activity within patient's tolerance, Monitored during session, Repositioned  ? ? ?Home Living   ?  ?  ?  ?  ?  ?  ?  ?  ?  ?   ?  ?Prior Function    ?  ?  ?   ? ?PT Goals (current goals can now be found in the care plan section) Acute Rehab PT Goals ?Patient Stated Goal: return home ?Progress towards PT goals: Progressing toward goals ? ?  ?Frequency ? ? ? Min 4X/week ? ? ? ?  ?PT Plan Current plan remains appropriate  ? ? ?Co-evaluation   ?  ?  ?  ?  ? ?  ?AM-PAC PT "6 Clicks" Mobility   ?Outcome Measure ? Help needed turning from your back to your side while in a flat  bed without using bedrails?: A Little ?Help needed moving from lying on your back to sitting on the side of a flat bed without using bedrails?: A Little ?Help needed moving to and from a bed to a chair (including a wheelchair)?: A Little ?Help needed standing up from a chair using your arms (e.g., wheelchair or bedside chair)?: A Little ?Help needed to walk in hospital room?: A Lot ?Help needed climbing 3-5 steps with a railing? : Total ?6 Click Score: 15 ? ?  ?End of Session Equipment Utilized During Treatment: Gait belt ?Activity Tolerance: Other (comment);Patient limited by fatigue (limited by incontinence of stool) ?Patient left: with call bell/phone within reach;with nursing/sitter in room;with family/visitor present (on 3N1) ?Nurse Communication: Mobility status ?PT Visit Diagnosis: Unsteadiness on feet (R26.81);Pain;Difficulty in walking, not elsewhere classified (R26.2) ?Pain - part of body:  (head) ?  ? ? ?Time: 1102-1140 ?PT Time Calculation (min) (ACUTE ONLY): 38 min ? ?Charges:  $Gait Training: 23-37 mins ?$Self Care/Home Management: 8-22          ?          ?Rashan Patient M,PT ?Acute Rehab Services ?[phone] ?[phone] (pager)  ? ? ?Bevelyn Buckles ?09/05/2021, 4:12 PM ? ?

## 2021-09-05 NOTE — Progress Notes (Signed)
Speech Language Pathology Treatment: Dysphagia;Cognitive-Linquistic  ?Patient Details ?Name: Kayla Maynard ?MRN: [id number] ?DOB: 04-12-1956 ?Today's Date: 09/05/2021 ?Time: 1632-1700 ?SLP Time Calculation (min) (ACUTE ONLY): 28 min ? ?Assessment / Plan / Recommendation ?Clinical Impression ? Pt consumes very limited POs per RN, who suspects it could be related to consistency of current meal trays. Pt acknowledges that she does not like the minced foods, but has a hard time expressing herself to explain what she typically eats at home. With additional time, as well as cues for word-finding and redirection from internal distracters, was able to discern that she eats vegetable soup, chicken, veggies (mushrooms and broccoli), scrambled eggs, pancakes, and strawberries. Pt was quite distracted through PO trials today but did eat a few bites of chopped pears, needing extra time for mastication and Min cues for oral clearance. One cough was observed on initial sip of water. Pt is self-limiting with PO trials - suspect a combination of appetite, mentation, and suspected esophageal issues at baseline. Pt needed Mod cues for simple problem solving to get herself more comfortable in bed to try to attend to PO intake better. She will benefit from ongoing SLP f/u but for now will adjust to Dys 3 diet and thin liquids to try to facilitate intake. Discussed with RN and will see if family can help in figuring out what she may eat at baseline as well. Would monitor closely during meals for oral clearance and safe positioning.  ?  ?HPI HPI: Ms. Cockerham is a 66 y/o woman who presented with LOC and was found to have seizures in the ED. On head CT she was found to have [facility] & SDH. She had a L PICA aneurysm coiled and underwent L pterional craniotomy for evacuation of SDH, bone flap in abdomen. Intubated from 4/28-5/2. Failed [facility]. ?  ?   ?SLP Plan ? Continue with current plan of care ? ?  ?  ?Recommendations for follow up therapy are one  component of a multi-disciplinary discharge planning process, led by the attending physician.  Recommendations may be updated based on patient status, additional functional criteria and insurance authorization. ?  ? ?Recommendations  ?Diet recommendations: Dysphagia 3 (mechanical soft);Thin liquid ?Liquids provided via: Cup;Straw ?Medication Administration: Crushed with puree ?Supervision: Patient able to self feed;Full supervision/cueing for compensatory strategies ?Compensations: Slow rate;Small sips/bites;Follow solids with liquid ?Postural Changes and/or Swallow Maneuvers: Seated upright 90 degrees;Upright 30-60 min after meal  ?   ?    ?   ? ? ? ? Oral Care Recommendations: Oral care BID ?Follow Up Recommendations: Acute inpatient rehab (3hours/day) ?Assistance recommended at discharge: Frequent or constant Supervision/Assistance ?SLP Visit Diagnosis: Dysphagia, unspecified (R13.10) ?Plan: Continue with current plan of care ? ? ? ? ?  ?  ? ? ?Mahala Menghini., M.A. CCC-SLP ?Acute Rehabilitation Services ?Office [phone] ? ?Secure chat preferred ? ? ?09/05/2021, 5:04 PM ?

## 2021-09-05 NOTE — Progress Notes (Signed)
Transcranial Doppler ?  ?Date POD PCO2 HCT BP   MCA ACA PCA OPHT SIPH VERT Basilar  ?4/30 ?CK         Right  ?Left  ? *  ?*  ? *  ?*  ? *  ?*  ? 30  ?37  ? *  ?*  ? 11  ?16  ? *  ?   ?  ?5/1,RS         Right  ?Left  ? *  ?69  ? *  ?-82  ? 27  ?*  ? 19  ?26  ? 38  ?41  ? *  ?*  ? *  ?   ?  ?5/3,RS         Right  ?Left  ? 108  ?95  ? -53  ?-35  ? *  ?*  ? 29  ?34  ? 50  ?53  ? -32  ?-23  ? *  ?   ?  ?5/5, ?RH         Right  ?Left  ? 80  ?97  ? -19  ?-21  ? 26  ?17  ? 19  ?24  ? 26  ?63  ? -36  ?-30  ? -13  ?   ?  ?  ? 5/8 ?CK         Right  ?Left  ?  17  ? 95  ?  *  ?-34  ?  19  ? 33  ?  26  ? 29  ?  *  ? *  ?  *  ? *  ?  *  ?   ?  ?          Right  ?Left  ?    ?   ?    ?   ?    ?   ?    ?   ?    ?   ?    ?   ?    ?   ?  ?          Right  ?Left  ?    ?   ?    ?   ?    ?   ?    ?   ?    ?   ?    ?   ?    ?   ?  ?  ?(*)=not insonated due to poor windows. ?  ? ? ?MCA = Middle Cerebral Artery      OPHT = Opthalmic Artery     BASILAR = Basilar Artery   ?ACA = Anterior Cerebral Artery     SIPH = Carotid Siphon ?PCA = Posterior Cerebral Artery   VERT = Verterbral Artery                  ?  ?Normal ?MCA = 62+\-12 ?ACA = 50+\-12 ?PCA = 42+\-23  ? ?

## 2021-09-05 NOTE — Progress Notes (Signed)
? ?NAME:  Kayla Maynard, MRN:  [id number], DOB:  1955/10/07, LOS: 9 ?ADMISSION DATE:  08/27/2021, CONSULTATION DATE:  08/27/2021 ?REFERRING MD:  Wynetta Emery - NSGY, CHIEF COMPLAINT:  SAH, endotracheally intubated  ? ?History of Present Illness:  ?66 year old woman who presented to [facility] ED 4/29 after loss of consciousness at home. Family actually started CPR per 911 advice, though unclear if pulses were actually lost. Description of hypertonicity and myoclonus more concerning for seizure. On EMS arrival patient awake but hypoglycemic, received D10. PMHx angina. ? ?In ED, patient denied history of seizures. Endorsed HA and neck pain, worse x 2 weeks. Had 2 additional seizures in ED. Intubated for airway protection. Found to have ICH - SAH, SDH. Became bradycardic and was subsequently started on hypertonic saline 3% and Cleviprex. ? ?Transferred from [facility] ED to [facility] ED for higher level of care/NSGY intervention. Underwent craniotomy and bone flap with NSGY (Dr. Wynetta Emery) 4/29. PCCM consulted for assistance with MV/medical management. Extubated 5/2. ? ?Pertinent Medical History:  ?Angina  ? ?Significant Hospital Events: ?Including procedures, antibiotic start and stop dates in addition to other pertinent events   ?4/29  multiple seizures. Hypoglycemia (factitious). Intubated. Found to have SAH, SDH. 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  Dominant left posterior communicating artery patent ?4/29 Left-sided pterional craniotomy for evacuation of left-sided subdural hematoma with implantation of the bone flap in the left abdominal wall ?4/30 considerable improvement postintervention with near complete resolution of midline shift. ?5/1 weaned 11hrs but still too sleepy for extubation, Intracranial drain removed by NS this morning. Tmax 101 ?5/2 aline  removed/ not working, more awake/extubated  ?5/4 working with PT  ?5/5 working with SLP.  Mild vasospasm on TCD ?5/6 adding immodium  ?5/7 diarrhea improved, no overnight events ?5/8 Ongoing HA/head pain. No significant events overnight. C/o seeing "red flowers" in L eye. ? ?Interim History / Subjective:  ?No significant events overnight ?Drowsy, c/o headache, head pain this AM ?Per sister, ongoing complaint of seeing "red flowers" in L visual field ? ?Objective:  ?Blood pressure (!) 163/79, pulse 73, temperature 98.1 ?F (36.7 ?C), temperature source Axillary, resp. rate 14, height 5\' 6"  (1.676 m), weight 83.4 kg, SpO2 93 %. ?   ?   ? ?Intake/Output Summary (Last 24 hours) at 09/05/2021 11/05/2021 ?Last data filed at 09/05/2021 0800 ?Gross per 24 hour  ?Intake 2533.78 ml  ?Output 2350 ml  ?Net 183.78 ml  ? ? ?Filed Weights  ? 09/01/21 0315 09/04/21 0500 09/05/21 0500  ?Weight: 88 kg 83.3 kg 83.4 kg  ? ?Physical Examination: ?General: Acutely ill-appearing middle-aged woman in NAD. Sleeping. ?HEENT: Bandages in place. Anicteric sclera, PERRL, moist mucous membranes. ?Neuro: Drowsy, but answering questions appropriately when stimulated. Responds to verbal stimuli. Following commands consistently. Moves all 4 extremities spontaneously. +Corneal, +Cough, and +Gag  ?CV: RRR, no m/g/r. ?PULM: Breathing even and unlabored on 4LNC. Lung fields CTAB. ?GI: Soft, nontender, nondistended. Normoactive bowel sounds. LLQ incision for bone flap c/d/i without erythema/drainage. ?Extremities: No significant LE edema noted. ?Skin: Warm/dry, no rashes. ? ?Resolved Hospital Problem List:  ? ? ?Assessment & Plan:  ? ?Acute respiratory failure with hypoxia ?Possible aspiration PNA ?Intubated 4/29 in ED for airway protection. Remained intubated through procedures; extubated 5/2 to [place]. ?- Continue supplemental O2 support ?- Wean O2 for sat > 90% ?- Pulmonary hygiene ?- Intermittent CXR ?-  Cefepime for aspiration PNA coverage (day 5  5/8) ? 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 ?- Management per NSGY, primary ?- TCDs per protocol (next today, 5/8) ?- Continue Nimotop for vasospasm management ?- Continue Keppra for seizures ?- Analgesia PRN for HA, limit sedating meds as able ?- Delirium precautions ? ?Dysphagia ?Inadequate PO intake  ?Diarrhea related to enteral nutrition  ?Physical deconditioning ?Imodium added, diarrhea better ?- Cortrak in place ?- Nocturnal TF only ?- SLP/PT/OT as able ? ?Macrocytic anemia  ?- Trend CBC ? ?Hyperglycemia ?- CBGs Q4H ?- SSI ?- Goal CBG 140-180 ? ?Best Practice: (right click and "Reselect all SmartList Selections" daily)  ? ?Diet/type: NPO TF + PO with po's as tolerated ?DVT prophylaxis: prophylactic heparin   ?GI prophylaxis: PPI ?Lines: N/A ?Foley:  N/A ?Code Status:  full code ?Last date of multidisciplinary goals of care discussion-- pt and family updated at bedside 5/8 ? ?Critical care time: N/A  ? ?Tim Lair, PA-C ?[facility] ?09/05/21 8:22 AM ? ?Please see [email or web address] for pager details. ? ?From 7A-7P if no response, please call [phone] ?After hours, please call ELink [phone] ?

## 2021-09-05 NOTE — Progress Notes (Signed)
Nutrition Follow-up ? ?DOCUMENTATION CODES:  ? ?Not applicable ? ?INTERVENTION:  ? ? ?- Calorie count x 48 hours ?- Offer Ensure Enlive/Ensure Plus High     Protein po BID ? ?- Nocturnal tube feeds via Cortrak: ?Osmolite 1.5 @ 50 ml/hr x 12 hours (600 ml/day) ?ProSource TF 45 ml TID ? ?Tube feeding regimen provides 1020 kcal, 66 grams of protein, and 684 ml of H2O.  ? ? ? ?NUTRITION DIAGNOSIS:  ? ?Inadequate oral intake related to inability to eat as evidenced by NPO status. ?Progressing.  ? ?GOAL:  ? ?Patient will meet greater than or equal to 90% of their needs ?Progressing with diet and TF ? ?MONITOR:  ? ?Vent status, Labs, Weight trends, TF tolerance, Skin, I & O's ? ?REASON FOR ASSESSMENT:  ? ?Ventilator, Consult ?Enteral/tube feeding initiation and management ? ?ASSESSMENT:  ? ?66 year old female who presented to the ED on 4/29 after being found unresponsive by family. PMH of angina. Pt with AMS in the ED and required intubation. Pt admitted with SAH, SDH. ? ? ?Pt discussed during ICU rounds and with RN. Per RN imodium ordered PRN for diarrhea.  ? ?Pt has not ate much from her meal trays. MD ok with food from home. Son brought in food. Per RN pt drank about 1/2 of her supplement.  ? ?04/29 - s/p aneurysm coiling, s/p L craniotomy for evacuation of SDH with implantation of bone flap in L abdominal wall ?04/30 - TF started ?05/01 - Cortrak placed (tip in distal stomach) ?05/02 - extubated ?05/03 - diet advanced to Full Liquids  ?05/06 - TF changed to nocturnal  ? ?Admit weight: 81.6 kg ?Current weight: 83.4 kg ? ?Medications reviewed and include: nutrisource fiber, SSI q 4 hours ?  ?Labs reviewed:  ?CBG's: 98-158 x 24 hours ? ?Current TF:  ?Osmolite 1.5 @ 50 ml/hr  ?ProSource TF 45 ml TID ?Tube feeding regimen provides 1020 kcal, 70 grams of protein (56% kcal and 66% protein needs)  ? ?Diet Order:   ?Diet Order   ? ?       ?  DIET DYS 3 Room service appropriate? Yes with Assist; Fluid consistency: Thin  Diet  effective now       ?  ? ?  ?  ? ?  ? ? ?EDUCATION NEEDS:  ? ?No education needs have been identified at this time ? ?Skin:  Skin Assessment: ?Skin Integrity Issues: ?Incisions: groin, abdomen, L head ? ?Last BM:  5/4 type 7 x 3 small ? ?Height:  ? ?Ht Readings from Last 1 Encounters:  ?08/27/21 5\' 6"  (1.676 m)  ? ? ?Weight:  ? ?Wt Readings from Last 1 Encounters:  ?09/06/21 83.4 kg  ? ? ?BMI:  Body mass index is 29.68 kg/m?. ? ?Estimated Nutritional Needs:  ? ?Kcal:  1800-2000 ? ?Protein:  105-125 grams ? ?Fluid:  1.8-2.0 L ? ? ?Lockie Pares., RD, LDN, CNSC ?See AMiON for contact information  ? ?

## 2021-09-06 ENCOUNTER — Inpatient Hospital Stay (HOSPITAL_COMMUNITY): Payer: Medicare Other

## 2021-09-06 LAB — GLUCOSE, CAPILLARY
Glucose-Capillary: 158 mg/dL — ABNORMAL HIGH (ref 70–99)
Glucose-Capillary: 98 mg/dL (ref 70–99)
Glucose-Capillary: 133 mg/dL — ABNORMAL HIGH (ref 70–99)
Glucose-Capillary: 109 mg/dL — ABNORMAL HIGH (ref 70–99)
Glucose-Capillary: 147 mg/dL — ABNORMAL HIGH (ref 70–99)
Glucose-Capillary: 130 mg/dL — ABNORMAL HIGH (ref 70–99)

## 2021-09-06 LAB — BASIC METABOLIC PANEL
Anion gap: 7 (ref 5–15)
BUN: 16 mg/dL (ref 8–23)
CO2: 26 mmol/L (ref 22–32)
Calcium: 8.7 mg/dL — ABNORMAL LOW (ref 8.9–10.3)
Chloride: 100 mmol/L (ref 98–111)
Creatinine, Ser: 0.54 mg/dL (ref 0.44–1.00)
GFR, Estimated: >60 mL/min (ref >60)
Glucose, Bld: 125 mg/dL — ABNORMAL HIGH (ref 70–99)
Potassium: 3.8 mmol/L (ref 3.5–5.1)
Sodium: 133 mmol/L — ABNORMAL LOW (ref 135–145)

## 2021-09-06 LAB — CBC
HCT: 34.7 % — ABNORMAL LOW (ref 36.0–46.0)
Hemoglobin: 11.8 g/dL — ABNORMAL LOW (ref 12.0–15.0)
MCH: 32.4 pg (ref 26.0–34.0)
MCHC: 34 g/dL (ref 30.0–36.0)
MCV: 95.3 fL (ref 80.0–100.0)
Platelets: 301 10*3/uL (ref 150–400)
RBC: 3.64 MIL/uL — ABNORMAL LOW (ref 3.87–5.11)
RDW: 12.2 % (ref 11.5–15.5)
WBC: 14.9 10*3/uL — ABNORMAL HIGH (ref 4.0–10.5)
nRBC: 0 % (ref 0.0–0.2)

## 2021-09-06 LAB — MAGNESIUM: Magnesium: 2.2 mg/dL (ref 1.7–2.4)

## 2021-09-06 MED ORDER — OSMOLITE 1.5 CAL PO LIQD
600.0000 mL | ORAL | Status: DC
  Administered 2021-09-06 – 2021-09-07 (×2): 600 mL
  Filled 2021-09-06: qty 1000
  Filled 2021-09-06: qty 711
  Filled 2021-09-06 (×2): qty 1000

## 2021-09-06 MED ORDER — OSMOLITE 1.5 CAL PO LIQD
600.0000 mL | ORAL | Status: DC
  Filled 2021-09-06: qty 711

## 2021-09-06 NOTE — Progress Notes (Signed)
? ?NAME:  Kayla Maynard, MRN:  [id number], DOB:  June 16, 1955, LOS: 10 ?ADMISSION DATE:  08/27/2021, CONSULTATION DATE:  08/27/2021 ?REFERRING MD:  Wynetta Emery - NSGY, CHIEF COMPLAINT:  SAH, endotracheally intubated  ? ?History of Present Illness:  ?66 year old woman who presented to [facility] ED 4/29 after loss of consciousness at home. Family actually started CPR per 911 advice, though unclear if pulses were actually lost. Description of hypertonicity and myoclonus more concerning for seizure. On EMS arrival patient awake but hypoglycemic, received D10. PMHx angina. ? ?In ED, patient denied history of seizures. Endorsed HA and neck pain, worse x 2 weeks. Had 2 additional seizures in ED. Intubated for airway protection. Found to have ICH - SAH, SDH. Became bradycardic and was subsequently started on hypertonic saline 3% and Cleviprex. ? ?Transferred from [facility] ED to [facility] ED for higher level of care/NSGY intervention. Underwent craniotomy and bone flap with NSGY (Dr. Wynetta Emery) 4/29. PCCM consulted for assistance with MV/medical management. Extubated 5/2. ? ?Pertinent Medical History:  ?Angina  ? ?Significant Hospital Events: ?Including procedures, antibiotic start and stop dates in addition to other pertinent events   ?4/29  multiple seizures. Hypoglycemia (factitious). Intubated. Found to have SAH, SDH. 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  Dominant left posterior communicating artery patent ?4/29 Left-sided pterional craniotomy for evacuation of left-sided subdural hematoma with implantation of the bone flap in the left abdominal wall ?4/30 considerable improvement postintervention with near complete resolution of midline shift. ?5/1 weaned 11hrs but still too sleepy for extubation, Intracranial drain removed by NS this morning. Tmax 101 ?5/2 aline  removed/ not working, more awake/extubated  ?5/4 working with PT  ?5/5 working with SLP.  Mild vasospasm on TCD ?5/6 adding immodium  ?5/7 diarrhea improved, no overnight events ?5/8 Ongoing HA/head pain. No significant events overnight. C/o seeing "red flowers" in L eye. ?5/9 Improved mobility, less head pain today. Up with assistance, tolerating well. ? ?Interim History / Subjective:  ?No significant events overnight ?Up to the bedside commode with assistance this morning ?Drowsy on exam, but had just exerted herself ?Continues to answer questions appropriately ?Remains hemodynamically stable ? ?Objective:  ?Blood pressure 139/89, pulse 78, temperature 98.6 ?F (37 ?C), temperature source Axillary, resp. rate 18, height 5\' 6"  (1.676 m), weight 83.4 kg, SpO2 97 %. ?   ?   ? ?Intake/Output Summary (Last 24 hours) at 09/06/2021 11/06/2021 ?Last data filed at 09/06/2021 0700 ?Gross per 24 hour  ?Intake 2450.18 ml  ?Output 700 ml  ?Net 1750.18 ml  ? ? ?Filed Weights  ? 09/04/21 0500 09/05/21 0500 09/06/21 0500  ?Weight: 83.3 kg 83.4 kg 83.4 kg  ? ?Physical Examination: ?General: Acutely ill-appearing middle-aged woman in NAD. Drowsy on exam/dozing off. ?HEENT: Anicteric sclera, PERRL, moist mucous membranes. L crani incision c/d/i with staple closure. No drainage or erythema. ?Neuro: Awake, oriented x 3, drowsy 2/2 exertion this morning. Responds to verbal stimuli. Following commands consistently. Moves all 4 extremities spontaneously. Strength 4-5/5 in all 4 extremities. +Cough and +Gag  ?CV: RRR, no m/g/r. ?PULM: Breathing even and unlabored on RA. Lung fields CTAB, diminished at bilateral bases. ?GI: Soft, nontender, nondistended. Normoactive bowel sounds. LLQ bone flap nontender to palpation. ?Extremities: No LE edema noted. ?Skin: Warm/dry, no rashes. Craniotomy/bone flap incisions as above. ? ?Resolved Hospital Problem List:  ? ? ?Assessment & Plan:  ? ?  Acute respiratory failure with hypoxia ?Possible aspiration  PNA ?Intubated 4/29 in ED for airway protection. Remained intubated through procedures; extubated 5/2 to [facility]. ?- Continue supplemental O2 support ?- Wean O2 for sat greater than 90% ?- Pulmonary hygiene ?- Intermittent CXR ?- S/p Cefepime x 5-day course for aspiration pneumonia coverage ? 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 ?- Management per NSGY, primary ?- TCDs per protocol ?- Continue Nimotop for vasospasm management ?- Continue Keppra for seizure prophylaxis ?- Analgesia as needed for HA, limit sedating meds as able ?- Delirium precautions ? ?Dysphagia ?Inadequate PO intake  ?Diarrhea related to enteral nutrition  ?Physical deconditioning ?Imodium added, diarrhea better. ?- Cortrak in place ?- Nocturnal TF only ?- SLP/PT/OT as able ? ?Macrocytic anemia  ?- Trend CBC ? ?Hyperglycemia ?- CBGs Q4H ?- SSI ?- Goal CBG 140-180 ? ?Best Practice: (right click and "Reselect all SmartList Selections" daily)  ? ?Diet/type: tubefeeds and Regular consistency (see orders) ?DVT prophylaxis: prophylactic heparin   ?GI prophylaxis: PPI ?Lines: N/A ?Foley:  N/A ?Code Status:  full code ?Last date of multidisciplinary goals of care discussion-- Patient and family updated at bedside 5/9 ? ?Critical care time: N/A  ? ?Tim Lair, PA-C ?[facility] ?09/06/21 8:12 AM ? ?Please see [email or web address] for pager details. ? ?From 7A-7P if no response, please call [phone] ?After hours, please call ELink [phone] ?

## 2021-09-06 NOTE — Progress Notes (Signed)
Physical Therapy Treatment ?Patient Details ?Name: Kayla Maynard ?MRN: [id number] ?DOB: August 16, 1955 ?Today's Date: 09/06/2021 ? ? ?History of Present Illness Kayla Maynard is a 66 y/o woman who presented with LOC and was found to have seizures x3 in the ED. CT showed extensive subarachnoid hemorrhage bilaterally and large SDH L frontal/ parietal/ temporal. 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  PMH: angina, back surgery ? ?  ?PT Comments  ? ? Pt received in recliner, son present. Pt progressing with mobility however, pt with first bout of ambulation pt with several incidences of stopping and seeming to fall asleep standing up. She continued to do this throughout ambulation as well as frequent bumping of obstacles on L side due to R gaze preference. Returned to room and pt used toilet. Pt then became more alert and requested to ambulate again. This time she remained alert throughout and needed to use toilet again while in hallway and was able to increase pace to return to room. Pt continues to need consistent min A due to cognitive and balance impairment. PT will continue to follow. ?   ?Recommendations for follow up therapy are one component of a multi-disciplinary discharge planning process, led by the attending physician.  Recommendations may be updated based on patient status, additional functional criteria and insurance authorization. ? ?Follow Up Recommendations ? Acute inpatient rehab (3hours/day) ?  ?  ?Assistance Recommended at Discharge Intermittent Supervision/Assistance  ?Patient can return home with the following A lot of help with bathing/dressing/bathroom;Assistance with cooking/housework;Assist for transportation;Help with stairs or ramp for entrance;A little help with walking and/or transfers ?  ?Equipment Recommendations ? Rolling walker (2 wheels)  ?  ?Recommendations for Other Services Rehab consult ? ? ?  ?Precautions / Restrictions Precautions ?Precautions:  Fall ?Precaution Comments: L crani, bone flap in L abdominal wall ?Restrictions ?Weight Bearing Restrictions: No  ?  ? ?Mobility ? Bed Mobility ?  ?  ?  ?  ?  ?  ?  ?General bed mobility comments: pt received in chair ?  ? ?Transfers ?Overall transfer level: Needs assistance ?  ?Transfers: Sit to/from Stand ?Sit to Stand: Min guard, Min assist ?  ?  ?  ?  ?  ?General transfer comment: vc's for hand placement, min A needed with first sit<>stand and then min guard next 3x and from toilet ?  ? ?Ambulation/Gait ?Ambulation/Gait assistance: Min assist, Mod assist ?Gait Distance (Feet): 100 Feet (then 180') ?Assistive device: Rolling walker (2 wheels) ?Gait Pattern/deviations: Step-through pattern, Decreased stride length, Narrow base of support, Drifts right/left, Staggering right ?Gait velocity: reduced ?Gait velocity interpretation: <1.8 ft/sec, indicate of risk for recurrent falls ?  ?General Gait Details: pt with R gaze preference and frequently hits obstacles on L. Worked on path finding using cues on L side of hallway, was very challenging for pt. During first ambulation, pt stopped and seemed to fall asleep standing up 3x, as well as c/o tingling RLE and moving very slowly. Second time pt ambulated, pt needed to use restroom after first 100' and then increased pace (which she did not seem able to do first time) and remained alert all the way back to room. ? ? ?Stairs ?  ?  ?  ?  ?  ? ? ?Wheelchair Mobility ?  ? ?Modified Rankin (Stroke Patients Only) ?Modified Rankin (Stroke Patients Only) ?Pre-Morbid Rankin Score: No symptoms ?Modified Rankin: Moderately severe disability ? ? ?  ?Balance Overall balance assessment: Needs assistance ?Sitting-balance support: No upper extremity  supported, Feet supported ?Sitting balance-Leahy Scale: Fair ?Sitting balance - Comments: sitting on toilet unsupported, no LOB but with fwd lean at times, vc's for safety ?  ?Standing balance support: Single extremity supported, During  functional activity ?Standing balance-Leahy Scale: Poor ?Standing balance comment: needs at least one UE supported externally for safety ?  ?  ?  ?  ?  ?  ?  ?  ?  ?  ?  ?  ? ?  ?Cognition Arousal/Alertness: Lethargic ?Behavior During Therapy: [facility] for tasks assessed/performed ?Overall Cognitive Status: Impaired/Different from baseline ?Area of Impairment: Memory, Attention, Safety/judgement, Awareness, Problem solving, Following commands ?  ?  ?  ?  ?  ?  ?  ?  ?  ?Current Attention Level: Sustained ?Memory: Decreased short-term memory ?Following Commands: Follows one step commands consistently ?Safety/Judgement: Decreased awareness of deficits, Decreased awareness of safety ?Awareness: Emergent ?Problem Solving: Slow processing, Difficulty sequencing ?General Comments: pt with decreased attention to task in combo with lethargy, needs frequent stimulation and redirection ?  ?  ? ?  ?Exercises   ? ?  ?General Comments General comments (skin integrity, edema, etc.): VSS. Son expressed concern with pt's falling asleep during activity ?  ?  ? ?Pertinent Vitals/Pain Pain Assessment ?Pain Assessment: Faces ?Faces Pain Scale: Hurts even more ?Pain Location: head, RLE radiating from back ?Pain Descriptors / Indicators: Discomfort, Grimacing, Tingling, Shooting, Radiating ?Pain Intervention(s): Limited activity within patient's tolerance, Monitored during session  ? ? ?Home Living   ?  ?  ?  ?  ?  ?  ?  ?  ?  ?   ?  ?Prior Function    ?  ?  ?   ? ?PT Goals (current goals can now be found in the care plan section) Acute Rehab PT Goals ?Patient Stated Goal: return home ?PT Goal Formulation: With patient ?Time For Goal Achievement: 09/13/21 ?Potential to Achieve Goals: Good ?Progress towards PT goals: Progressing toward goals ? ?  ?Frequency ? ? ? Min 4X/week ? ? ? ?  ?PT Plan Current plan remains appropriate  ? ? ?Co-evaluation   ?  ?  ?  ?  ? ?  ?AM-PAC PT "6 Clicks" Mobility   ?Outcome Measure ? Help needed turning from your  back to your side while in a flat bed without using bedrails?: A Little ?Help needed moving from lying on your back to sitting on the side of a flat bed without using bedrails?: A Little ?Help needed moving to and from a bed to a chair (including a wheelchair)?: A Little ?Help needed standing up from a chair using your arms (e.g., wheelchair or bedside chair)?: A Little ?Help needed to walk in hospital room?: A Little ?Help needed climbing 3-5 steps with a railing? : Total ?6 Click Score: 16 ? ?  ?End of Session Equipment Utilized During Treatment: Gait belt ?Activity Tolerance: Patient tolerated treatment well ?Patient left: with call bell/phone within reach;with nursing/sitter in room;with family/visitor present (in bathroom) ?Nurse Communication: Mobility status ?PT Visit Diagnosis: Unsteadiness on feet (R26.81);Pain;Difficulty in walking, not elsewhere classified (R26.2) ?Pain - part of body: Leg (head) ?  ? ? ?Time: [id number] ?PT Time Calculation (min) (ACUTE ONLY): 45 min ? ?Charges:  $Gait Training: 23-37 mins ?$Therapeutic Activity: 8-22 mins          ?          ? ?Lyanne CoVictoria Clemmie Buelna, PT  ?Acute Rehab Services ? Pager [phone] ?Office [phone] ? ? ? ?TurkeyVictoria L  Bonney Berres ?09/06/2021, 3:20 PM ? ?

## 2021-09-06 NOTE — Progress Notes (Signed)
Speech Language Pathology Treatment: Dysphagia;Cognitive-Linquistic  ?Patient Details ?Name: Kayla Maynard ?MRN: [id number] ?DOB: [birth year] ?Today's Date: 09/06/2021 ?Time: 9892-1194 ?SLP Time Calculation (min) (ACUTE ONLY): 18 min ? ?Assessment / Plan / Recommendation ?Clinical Impression ? Pt seen for dysphagia and cognition. Prior to seeing pt she was trying to get out of bed because her foot fell asleep. She was able to state she was not supposed to get out of bed. She needed verbal reminders of why her head was hurting. Intellectual awareness of deficits was non specific and stated she needed help with "everything." Pt independently pointed to call bell when asked what to do when help needed.  ?Pt self fed peaches with assist and masticated timely without residue. There were no s/sx aspiration with straw sips thin or soft solids. Recommend continue Dys 3, thin with supervision and assist with self feeding.  ?  ?HPI HPI: Kayla Maynard is a 66 y/o woman who presented with LOC and was found to have seizures in the ED. On head CT she was found to have [facility] & SDH. She had a L PICA aneurysm coiled and underwent L pterional craniotomy for evacuation of SDH, bone flap in abdomen. Intubated from 4/28-5/2. Failed [facility]. ?  ?   ?SLP Plan ? Continue with current plan of care ? ?  ?  ?Recommendations for follow up therapy are one component of a multi-disciplinary discharge planning process, led by the attending physician.  Recommendations may be updated based on patient status, additional functional criteria and insurance authorization. ?  ? ?Recommendations  ?Diet recommendations: Dysphagia 3 (mechanical soft);Thin liquid ?Liquids provided via: Cup;Straw ?Medication Administration: Crushed with puree ?Supervision: Patient able to self feed;Staff to assist with self feeding;Full supervision/cueing for compensatory strategies ?Compensations: Slow rate;Small sips/bites;Follow solids with liquid ?Postural Changes and/or Swallow  Maneuvers: Seated upright 90 degrees;Upright 30-60 min after meal  ?   ?    ?   ? ? ? ? Oral Care Recommendations: Oral care BID ?Follow Up Recommendations: Acute inpatient rehab (3hours/day) ?Assistance recommended at discharge: Frequent or constant Supervision/Assistance ?SLP Visit Diagnosis: Cognitive communication deficit (R41.841);Dysphagia, unspecified (R13.10) ?Plan: Continue with current plan of care ? ? ? ? ?  ?  ? ? ?Kayla Maynard ? ?09/06/2021, 3:20 PM ?

## 2021-09-06 NOTE — Progress Notes (Signed)
Inpatient Rehab Admissions Coordinator:  ? ? I do not have a CIR bed for this today. I will continue to follow potential admit pending medical readiness and bed availability.  ? ?Megan Salon, MS, CCC-SLP ?Rehab Admissions Coordinator  ?[phone] (celll) ?[phone] (office) ? ?

## 2021-09-06 NOTE — Progress Notes (Signed)
Occupational Therapy Treatment ?Patient Details ?Name: Joshlyn Beadle ?MRN: [id number] ?DOB: [birth year] ?Today's Date: 09/06/2021 ? ? ?History of present illness Ms. Windholz is a 66 y/o woman who presented with LOC and was found to have seizures x3 in the ED. CT showed extensive subarachnoid hemorrhage bilaterally and large SDH L frontal/ parietal/ temporal. 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  PMH: angina, back surgery ?  ?OT comments ? Emilie is progressing towards her acute OT goals. She completed functional ambulation with min A for safety cues, impulsivity and RW management. Pt completed toileting and required mod A for rear peri hygiene. Pt followed most simple 1 step cues with increased time for processing, and 0% of 2+ step commands. Pt also noted with some word finding difficulty this session when having discussion about grooming tasks (unable to state "toothpaste"). Pt continues to benefit from OT acutely, POC remains appropriate.   ? ?Recommendations for follow up therapy are one component of a multi-disciplinary discharge planning process, led by the attending physician.  Recommendations may be updated based on patient status, additional functional criteria and insurance authorization. ?   ?Follow Up Recommendations ? Acute inpatient rehab (3hours/day)  ?  ?Assistance Recommended at Discharge Frequent or constant Supervision/Assistance  ?Patient can return home with the following ? A lot of help with walking and/or transfers;A lot of help with bathing/dressing/bathroom;Assistance with cooking/housework;Assistance with feeding;Direct supervision/assist for medications management;Direct supervision/assist for financial management;Assist for transportation;Help with stairs or ramp for entrance ?  ?Equipment Recommendations ? Other (comment)  ?  ?Recommendations for Other Services Rehab consult ? ?  ?Precautions / Restrictions Precautions ?Precautions: Fall ?Precaution  Comments: L crani, bone flap in L abdominal wall ?Restrictions ?Weight Bearing Restrictions: No  ? ? ?  ? ?Mobility Bed Mobility ?Overal bed mobility: Needs Assistance ?Bed Mobility: Sit to Supine ?  ?  ?  ?Sit to supine: Min assist ?  ?General bed mobility comments: for LE management ?  ? ?Transfers ?Overall transfer level: Needs assistance ?Equipment used: Rollator (4 wheels) ?Transfers: Sit to/from Stand ?Sit to Stand: Min guard ?  ?  ?  ?  ?  ?General transfer comment: cues for hand placement and safety ?  ?  ?Balance Overall balance assessment: Needs assistance ?Sitting-balance support: No upper extremity supported, Feet supported ?Sitting balance-Leahy Scale: Fair ?Sitting balance - Comments: sitting on toilet unsupported, no LOB ?  ?Standing balance support: Single extremity supported, During functional activity ?Standing balance-Leahy Scale: Poor ?Standing balance comment: needs at least one UE supported externally for safety ?  ?  ?  ?  ?  ?  ?  ?  ?  ?  ?  ?   ? ?ADL either performed or assessed with clinical judgement  ? ?ADL Overall ADL's : Needs assistance/impaired ?  ?  ?Grooming: Min guard;Standing ?Grooming Details (indicate cue type and reason): impulsive ?  ?  ?  ?  ?  ?  ?  ?  ?Toilet Transfer: Rolling walker (2 wheels);Ambulation;Regular Toilet;Minimal assistance ?Toilet Transfer Details (indicate cue type and reason): assist for cues, and to manage RW ?Toileting- Clothing Manipulation and Hygiene: Moderate assistance;Sit to/from stand ?Toileting - Clothing Manipulation Details (indicate cue type and reason): required assist for thoroughness ?  ?  ?Functional mobility during ADLs: Rolling walker (2 wheels);Minimal assistance ?General ADL Comments: completed toileting and grooming, impulsive with mobility with poor management of RW. cues throughout for safety ?  ? ?Extremity/Trunk Assessment Upper Extremity Assessment ?Upper Extremity Assessment: RUE  deficits/detail;LUE deficits/detail ?RUE  Deficits / Details: generally weak, full AAROM available, slow and deliberate movements, generalized edema present ?LUE Deficits / Details: generally weak, full AAROM available, slow and deliberate movements, edema throughout LUE ?LUE Sensation: WNL ?LUE Coordination: decreased fine motor;decreased gross motor ?  ?Lower Extremity Assessment ?Lower Extremity Assessment: Defer to PT evaluation ?  ?  ?  ? ?Vision   ?Vision Assessment?: Vision impaired- to be further tested in functional context ?  ?Perception Perception ?Perception: Not tested ?  ?Praxis Praxis ?Praxis: Not tested ?  ? ?Cognition Arousal/Alertness: Awake/alert ?Behavior During Therapy: [facility] for tasks assessed/performed ?Overall Cognitive Status: Impaired/Different from baseline ?Area of Impairment: Memory, Attention, Safety/judgement, Awareness, Problem solving, Following commands ?  ?  ?  ?  ?  ?  ?  ?  ?  ?Current Attention Level: Sustained ?Memory: Decreased short-term memory ?Following Commands: Follows one step commands consistently ?Safety/Judgement: Decreased awareness of deficits, Decreased awareness of safety ?Awareness: Emergent ?Problem Solving: Slow processing, Difficulty sequencing ?General Comments: pt a bit impulsive for mobilizing. vc for safety. follows most commands with increased time. word finding difficulties noted ?  ?  ?   ?   ?   ?General Comments VSS on RA  ? ? ?Pertinent Vitals/ Pain       Pain Assessment ?Pain Assessment: Faces ?Faces Pain Scale: Hurts little more ?Pain Location: head? ?Pain Descriptors / Indicators: Discomfort, Grimacing ?Pain Intervention(s): Limited activity within patient's tolerance, Monitored during session ? ? ?Frequency ? Min 2X/week  ? ? ? ? ?  ?Progress Toward Goals ? ?OT Goals(current goals can now be found in the care plan section) ? Progress towards OT goals: Progressing toward goals ? ?Acute Rehab OT Goals ?Patient Stated Goal: did not state ?OT Goal Formulation: With patient ?Time For Goal  Achievement: 09/20/21 ?Potential to Achieve Goals: Good ?ADL Goals ?Pt Will Perform Grooming: with set-up;sitting ?Pt Will Perform Upper Body Dressing: with set-up;sitting ?Pt Will Perform Lower Body Dressing: with min guard assist;sit to/from stand ?Pt Will Transfer to Toilet: with min guard assist;ambulating ?Additional ADL Goal #1: Pt will demonstrate increased activity tolerance to complete at least 3 ADLs in standing with min G  ?Plan Discharge plan remains appropriate   ? ?   ?AM-PAC OT "6 Clicks" Daily Activity     ?Outcome Measure ? ? Help from another person eating meals?: A Little ?Help from another person taking care of personal grooming?: A Little ?Help from another person toileting, which includes using toliet, bedpan, or urinal?: A Lot ?Help from another person bathing (including washing, rinsing, drying)?: A Lot ?Help from another person to put on and taking off regular upper body clothing?: A Little ?Help from another person to put on and taking off regular lower body clothing?: A Lot ?6 Click Score: 15 ? ?  ?End of Session Equipment Utilized During Treatment: Gait belt;Rolling walker (2 wheels) ? ?OT Visit Diagnosis: Unsteadiness on feet (R26.81);Other abnormalities of gait and mobility (R26.89);Muscle weakness (generalized) (M62.81);Pain ?  ?Activity Tolerance Patient tolerated treatment well ?  ?Patient Left in bed;with call bell/phone within reach;with bed alarm set ?  ?Nurse Communication Mobility status ?  ? ?   ? ?Time: 1401-1420 ?OT Time Calculation (min): 19 min ? ?Charges: OT General Charges ?$OT Visit: 1 Visit ?OT Treatments ?$Self Care/Home Management : 8-22 mins ? ? ? ?Hommer Cunliffe A Laquentin Loudermilk ?09/06/2021, 3:02 PM ?

## 2021-09-06 NOTE — Progress Notes (Signed)
?  NEUROSURGERY PROGRESS NOTE  ? ?Pt seen and examined. No significant c/o currently. Improved appetite today. ? ?EXAM: ?Temp:  [97.9 ?F (36.6 ?C)-98.9 ?F (37.2 ?C)] 97.9 ?F (36.6 ?C) (05/09 0800) ?Pulse Rate:  [66-94] 68 (05/09 1100) ?Resp:  [12-22] 12 (05/09 1100) ?BP: (103-170)/(51-91) 154/84 (05/09 1100) ?SpO2:  [91 %-99 %] 94 % (05/09 1100) ?Weight:  [83.4 kg] 83.4 kg (05/09 0500) ?Intake/Output   ?   05/08 0701 ?05/09 0700 05/09 0701 ?05/10 0700  ? I.V. (mL/kg) 1611.3 (19.3)   ? NG/GT 766   ? IV Piggyback 199.9   ? Total Intake(mL/kg) 2577.2 (30.9)   ? Urine (mL/kg/hr) 700 (0.3)   ? Stool 0   ? Total Output 700   ? Net +1877.2   ?     ? Urine Occurrence 9 x 2 x  ? Stool Occurrence 5 x 1 x  ?  ?Awake, alert, oriented ?fluent speech ?Good strength throughout ?Wounds c/d/i ? ?LABS: ?Lab Results  ?Component Value Date  ? CREATININE 0.54 09/06/2021  ? BUN 16 09/06/2021  ? NA 133 (L) 09/06/2021  ? K 3.8 09/06/2021  ? CL 100 09/06/2021  ? CO2 26 09/06/2021  ? ?Lab Results  ?Component Value Date  ? WBC 14.9 (H) 09/06/2021  ? HGB 11.8 (L) 09/06/2021  ? HCT 34.7 (L) 09/06/2021  ? MCV 95.3 09/06/2021  ? PLT 301 09/06/2021  ? ?TCD: ?Date POD PCO2 HCT BP   MCA ACA PCA OPHT SIPH VERT Basilar  ?4/30 ?CK         Right  ?Left  ? *  ?*  ? *  ?*  ? *  ?*  ? 30  ?37  ? *  ?*  ? 11  ?16  ? *  ?   ?  ?5/1,RS         Right  ?Left  ? *  ?69  ? *  ?-82  ? 27  ?*  ? 19  ?26  ? 38  ?41  ? *  ?*  ? *  ?   ?  ?5/3,RS         Right  ?Left  ? 108  ?95  ? -53  ?-35  ? *  ?*  ? 29  ?34  ? 50  ?53  ? -32  ?-23  ? *  ?   ?  ?5/5, ?RH         Right  ?Left  ? 80  ?97  ? -19  ?-21  ? 26  ?17  ? 19  ?24  ? 26  ?63  ? -36  ?-30  ? -13  ?   ?  ? ? ?IMPRESSION: ?- 66 y.o. female [place] d# 10 s/p coil embo L PCom aneurysm, neurologically stable, non-focal exam. No clinical or ultrasonographic concern for spasm. ?- SZ provoked by [facility] ?- presumed pneumonia now treated ? ?PLAN: ?- Cont ICU observation for spasm ?- Cont Nimotop ?- Cont Keppra ?- Cont therapy,  likely CIR post-acute care ?- Plan on repeat CTH later this week to decide if cranioplasty is feasible prior to discharge ? ?Reviewed the situation with the patient's son at bedside. All questions were answered. ? ?Consuella Lose, MD ?[facility]  ? ?

## 2021-09-07 ENCOUNTER — Inpatient Hospital Stay (HOSPITAL_COMMUNITY): Payer: Medicare Other

## 2021-09-07 DIAGNOSIS — R410 Disorientation, unspecified: Secondary | ICD-10-CM

## 2021-09-07 DIAGNOSIS — R1310 Dysphagia, unspecified: Secondary | ICD-10-CM

## 2021-09-07 LAB — GLUCOSE, CAPILLARY
Glucose-Capillary: 135 mg/dL — ABNORMAL HIGH (ref 70–99)
Glucose-Capillary: 91 mg/dL (ref 70–99)
Glucose-Capillary: 213 mg/dL — ABNORMAL HIGH (ref 70–99)
Glucose-Capillary: 108 mg/dL — ABNORMAL HIGH (ref 70–99)
Glucose-Capillary: 89 mg/dL (ref 70–99)

## 2021-09-07 LAB — BASIC METABOLIC PANEL
Anion gap: 9 (ref 5–15)
BUN: 26 mg/dL — ABNORMAL HIGH (ref 8–23)
CO2: 25 mmol/L (ref 22–32)
Calcium: 9.2 mg/dL (ref 8.9–10.3)
Chloride: 102 mmol/L (ref 98–111)
Creatinine, Ser: 0.55 mg/dL (ref 0.44–1.00)
GFR, Estimated: >60 mL/min (ref >60)
Glucose, Bld: 110 mg/dL — ABNORMAL HIGH (ref 70–99)
Potassium: 3.9 mmol/L (ref 3.5–5.1)
Sodium: 136 mmol/L (ref 135–145)

## 2021-09-07 LAB — CBC
HCT: 36.8 % (ref 36.0–46.0)
Hemoglobin: 12.1 g/dL (ref 12.0–15.0)
MCH: 31.8 pg (ref 26.0–34.0)
MCHC: 32.9 g/dL (ref 30.0–36.0)
MCV: 96.6 fL (ref 80.0–100.0)
Platelets: 341 10*3/uL (ref 150–400)
RBC: 3.81 MIL/uL — ABNORMAL LOW (ref 3.87–5.11)
RDW: 12.3 % (ref 11.5–15.5)
WBC: 16.5 10*3/uL — ABNORMAL HIGH (ref 4.0–10.5)
nRBC: 0 % (ref 0.0–0.2)

## 2021-09-07 LAB — MAGNESIUM: Magnesium: 2.3 mg/dL (ref 1.7–2.4)

## 2021-09-07 NOTE — Progress Notes (Signed)
? ?NAME:  Kayla Maynard, MRN:  [id number], DOB:  03/04/1956, LOS: 11 ?ADMISSION DATE:  08/27/2021, CONSULTATION DATE:  08/27/2021 ?REFERRING MD:  Wynetta Emery - NSGY, CHIEF COMPLAINT:  SAH, endotracheally intubated  ? ?History of Present Illness:  ?66 year old woman who presented to [facility] ED 4/29 after loss of consciousness at home. Family actually started CPR per 911 advice, though unclear if pulses were actually lost. Description of hypertonicity and myoclonus more concerning for seizure. On EMS arrival patient awake but hypoglycemic, received D10. PMHx angina. ? ?In ED, patient denied history of seizures. Endorsed HA and neck pain, worse x 2 weeks. Had 2 additional seizures in ED. Intubated for airway protection. Found to have ICH - SAH, SDH. Became bradycardic and was subsequently started on hypertonic saline 3% and Cleviprex. ? ?Transferred from [facility] ED to [facility] ED for higher level of care/NSGY intervention. Underwent craniotomy and bone flap with NSGY (Dr. Wynetta Emery) 4/29. PCCM consulted for assistance with MV/medical management. Extubated 5/2. ? ?Pertinent Medical History:  ?Angina  ? ?Significant Hospital Events: ?Including procedures, antibiotic start and stop dates in addition to other pertinent events   ?4/29  multiple seizures. Hypoglycemia (factitious). Intubated. Found to have SAH, SDH. 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  Dominant left posterior communicating artery patent ?4/29 Left-sided pterional craniotomy for evacuation of left-sided subdural hematoma with implantation of the bone flap in the left abdominal wall ?4/30 considerable improvement postintervention with near complete resolution of midline shift. ?5/1 weaned 11hrs but still too sleepy for extubation, Intracranial drain removed by NS this morning. Tmax 101 ?5/2 aline  removed/ not working, more awake/extubated  ?5/4 working with PT  ?5/5 working with SLP.  Mild vasospasm on TCD ?5/6 adding immodium  ?5/7 diarrhea improved, no overnight events ?5/8 Ongoing HA/head pain. No significant events overnight. C/o seeing "red flowers" in L eye. ?5/9 Improved mobility, less head pain today. Up with assistance, tolerating well. ?5/10 increased agitation today, up getting out of bed and fall risk ? ?Interim History / Subjective:  ? ?More agitated and confused today ?Trying to get out of bed ?Remains hemodynamically stable ? ? ?Objective:  ?Blood pressure (!) 134/112, pulse 85, temperature 97.6 ?F (36.4 ?C), temperature source Oral, resp. rate 17, height 5\' 6"  (1.676 m), weight 83.4 kg, SpO2 93 %. ?   ?   ? ?Intake/Output Summary (Last 24 hours) at 09/07/2021 11/07/2021 ?Last data filed at 09/07/2021 0700 ?Gross per 24 hour  ?Intake 792.5 ml  ?Output --  ?Net 792.5 ml  ? ? ?Filed Weights  ? 09/04/21 0500 09/05/21 0500 09/06/21 0500  ?Weight: 83.3 kg 83.4 kg 83.4 kg  ? ? ? ?General:  ill-appearing F, awake, restless, moaning ?HEENT: MM pink/moist, L craniectomy site stapled and dry, sclera anicteric  ?Neuro: awake, speech clear but not oriented to situation, demanding to get OOB, following commands, moving all extremities, no focal deficits ?CV: s1s2 rrr, no m/r/g ?PULM:  lungs clear bilaterally, no distress on RA ?GI: soft, non-distended and non-tender ?Extremities: warm/dry, no edema  ?Skin: no rashes or lesions ? ? ? ?Labs reviewed ?WBC 16.5 ?Glu 135 ?Na 136 ? ?Resolved Hospital Problem List:  ?Acute Respiratory Failure with hypoxia likely secondary to aspiration PNA-completed course of Cefepime and now on RA ? ?Assessment & Plan:  ? ?Acute respiratory failure with hypoxia ?Possible aspiration PNA ?Intubated 4/29 in  ED for airway protection. Remained intubated through procedures; extubated 5/2 to [facility]. ?-now on RA, completed course of Cefepime ?-cont. Pulm hygiene ? ? ?Atraumatic SAH originating  from L P-comm aneurysm ?Acute SDH ?Brain compression  ?ICU delirium  ?HA ?-cont. ICU observation for spasm ?- HA approx 2 wk prior to presentation, possible that was sentinel bleed. ?- Management per NSGY, primary ?- TCDs per protocol ?- cont. Nimotop for vasospasm management ?- cont. Keppra for seizure prophylaxis ?-concern for worsening delirium today, no focal deficits, concern that patient is trying to get out of bed unassisted and is a fall risk.  Posey belt, trying to limit sedating medications to preserve neuro exam, but may require low dose Seroquel or Klonopin ?- Delirium precautions ? ?Dysphagia ?Inadequate PO intake  ?Diarrhea related to enteral nutrition  ?Physical deconditioning ?Imodium added, diarrhea better. ?- Cortrak in place ?- Nocturnal TF only ?- SLP/PT/OT as able ? ?Macrocytic anemia  ?- Trend CBC ? ?Hyperglycemia ?- CBGs Q4H ?- SSI ?- Goal CBG 140-180, at goal ? ?Best Practice: (right click and "Reselect all SmartList Selections" daily)  ? ?Diet/type: tubefeeds and Regular consistency (see orders) ?DVT prophylaxis: prophylactic heparin   ?GI prophylaxis: PPI ?Lines: N/A ?Foley:  N/A ?Code Status:  full code ?Last date of multidisciplinary goals of care discussion-- Patient and family updated at bedside 5/9 ? ?Critical care time: N/A  ? ? ?Darcella Gasman Amel Gianino, PA-C ?[facility] ?See Amion for pager ?If no response to pager , please call [phone] until 7pm ?After 7:00 pm call Elink  [phone] ? ?

## 2021-09-07 NOTE — Progress Notes (Signed)
Transcranial Doppler ?  ?Date POD PCO2 HCT BP   MCA ACA PCA OPHT SIPH VERT Basilar  ?4/30 ?CK         Right  ?Left  ? *  ?*  ? *  ?*  ? *  ?*  ? 30  ?37  ? *  ?*  ? 11  ?16  ? *  ?   ?  ?5/1 ?RS         Right  ?Left  ? *  ?69  ? *  ?-82  ? 27  ?*  ? 19  ?26  ? 38  ?41  ? *  ?*  ? *  ?   ?  ?5/3 ?RS         Right  ?Left  ? 108  ?95  ? -53  ?-35  ? *  ?*  ? 29  ?34  ? 50  ?53  ? -32  ?-23  ? *  ?   ?  ?5/5, ?RH         Right  ?Left  ? 80  ?97  ? -19  ?-21  ? 26  ?17  ? 19  ?24  ? 26  ?63  ? -36  ?-30  ? -13  ?   ?  ? 5/8 ?CK         Right  ?Left  ?  17  ? 95  ?  *  ?-34  ?  19  ? 33  ?  26  ? 29  ?  *  ? *  ?  *  ? *  ?  *  ?   ?  ? 5/10 ?RH         Right  ?Left  ?  63  ? 68  ?  *  ? -24  ?  29  ? 22  ?  23  ? 28  ?  30  ? 40  ?  -19  ? -21  ?  -21  ?   ?  ?          Right  ?Left  ?    ?   ?    ?   ?    ?   ?    ?   ?    ?   ?    ?   ?    ?   ?  ?  ?(*)=not insonated due to poor windows. ?  ? ? ?MCA = Middle Cerebral Artery      OPHT = Opthalmic Artery     BASILAR = Basilar Artery   ?ACA = Anterior Cerebral Artery     SIPH = Carotid Siphon ?PCA = Posterior Cerebral Artery   VERT = Verterbral Artery                  ?  ?Normal ?MCA = 62+\-12 ?ACA = 50+\-12 ?PCA = 42+\-23  ? ?Rt [place] = 3.71  LT [place] = 4.53 ? ?Jean Rosenthal, RDMS, RVT ?

## 2021-09-07 NOTE — Progress Notes (Signed)
Inpatient Rehab Admissions Coordinator:  ? ? I do not have a CIR bed for this Pt. Today. She is not yet medically ready. I will continue to follow for potential admit pending insurance auth and medical readiness. ? ?Megan Salon, MS, CCC-SLP ?Rehab Admissions Coordinator  ?[phone] (celll) ?[phone] (office) ? ?

## 2021-09-07 NOTE — Progress Notes (Signed)
?  NEUROSURGERY PROGRESS NOTE  ? ?Pt seen and examined. Does c/o sacral pain ? ?EXAM: ?Temp:  [96 ?F (35.6 ?C)-98.2 ?F (36.8 ?C)] 98.2 ?F (36.8 ?C) (05/10 0800) ?Pulse Rate:  [62-85] 85 (05/10 0800) ?Resp:  [12-22] 17 (05/10 0800) ?BP: (114-154)/(55-112) 134/112 (05/10 0800) ?SpO2:  [93 %-98 %] 93 % (05/10 0800) ?Intake/Output   ?   05/09 0701 ?05/10 0700 05/10 0701 ?05/11 0700  ? P.O. 150   ? I.V. (mL/kg)    ? NG/GT 642.5   ? IV Piggyback    ? Total Intake(mL/kg) 792.5 (9.5)   ? Urine (mL/kg/hr)    ? Stool    ? Total Output    ? Net +792.5   ?     ? Urine Occurrence 12 x   ? Stool Occurrence 10 x   ?  ?Awake, alert, oriented ?fluent speech ?Good strength throughout ?Wounds c/d/i ? ?LABS: ?Lab Results  ?Component Value Date  ? CREATININE 0.55 09/07/2021  ? BUN 26 (H) 09/07/2021  ? NA 136 09/07/2021  ? K 3.9 09/07/2021  ? CL 102 09/07/2021  ? CO2 25 09/07/2021  ? ?Lab Results  ?Component Value Date  ? WBC 16.5 (H) 09/07/2021  ? HGB 12.1 09/07/2021  ? HCT 36.8 09/07/2021  ? MCV 96.6 09/07/2021  ? PLT 341 09/07/2021  ? ?TCD: ?Date POD PCO2 HCT BP   MCA ACA PCA OPHT SIPH VERT Basilar  ?4/30 ?CK         Right  ?Left  ? *  ?*  ? *  ?*  ? *  ?*  ? 30  ?37  ? *  ?*  ? 11  ?16  ? *  ?   ?  ?5/1,RS         Right  ?Left  ? *  ?69  ? *  ?-82  ? 27  ?*  ? 19  ?26  ? 38  ?41  ? *  ?*  ? *  ?   ?  ?5/3,RS         Right  ?Left  ? 108  ?95  ? -53  ?-35  ? *  ?*  ? 29  ?34  ? 50  ?53  ? -32  ?-23  ? *  ?   ?  ?5/5, ?RH         Right  ?Left  ? 80  ?97  ? -19  ?-21  ? 26  ?17  ? 19  ?24  ? 26  ?63  ? -36  ?-30  ? -13  ?   ?  ? ? ?IMPRESSION: ?- 66 y.o. female [place] d# 11 s/p coil embo L PCom aneurysm, neurologically stable, non-focal exam. No clinical or ultrasonographic concern for spasm. ?- SZ provoked by [facility] ? ?PLAN: ?- Cont ICU observation for spasm ?- Cont Nimotop ?- Cont Keppra ?- Cont therapy, likely CIR post-acute care ?- Will order CTH today ? ?Lisbeth Renshaw, MD ?[facility]  ? ?

## 2021-09-07 NOTE — Progress Notes (Signed)
Physical Therapy Treatment ?Patient Details ?Name: Kayla Maynard ?MRN: [id number] ?DOB: 1955-11-14 ?Today's Date: 09/07/2021 ? ? ?History of Present Illness Ms. Berlanga is a 66 y/o woman who presented with LOC and was found to have seizures x3 in the ED. CT showed extensive subarachnoid hemorrhage bilaterally and large SDH L frontal/ parietal/ temporal. 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  PMH: angina, back surgery ? ?  ?PT Comments  ? ? Pt is limited by pain in head and BLE. Pt is intermittently agitated during session, refusing to sit down at times. Pt with poor awareness and memory throughout session, stopping at 7 rooms prior to finding her own room despite PT telling the patient how many rooms she needed to pass to arrive at hers. Pt continues to demonstrate significant safety awareness deficits as well as expressive aphasia. Pt will benefit from further dynamic gait and balance training as she is unlikely to utilize a walker with her impaired safety. PT continues to demonstrate AIR admission.  ?Recommendations for follow up therapy are one component of a multi-disciplinary discharge planning process, led by the attending physician.  Recommendations may be updated based on patient status, additional functional criteria and insurance authorization. ? ?Follow Up Recommendations ? Acute inpatient rehab (3hours/day) ?  ?  ?Assistance Recommended at Discharge Intermittent Supervision/Assistance  ?Patient can return home with the following A lot of help with bathing/dressing/bathroom;Assistance with cooking/housework;Assist for transportation;Help with stairs or ramp for entrance;A little help with walking and/or transfers ?  ?Equipment Recommendations ? Rolling walker (2 wheels)  ?  ?Recommendations for Other Services   ? ? ?  ?Precautions / Restrictions Precautions ?Precautions: Fall ?Precaution Comments: L crani, bone flap in L abdominal wall ?Restrictions ?Weight Bearing  Restrictions: No  ?  ? ?Mobility ? Bed Mobility ?  ?  ?  ?  ?  ?  ?  ?  ?  ? ?Transfers ?Overall transfer level: Needs assistance ?Equipment used: Rolling walker (2 wheels), None ?Transfers: Sit to/from Stand, Bed to chair/wheelchair/BSC ?Sit to Stand: Min guard ?  ?Step pivot transfers: Min guard ?  ?  ?  ?General transfer comment: pt performs many sit to stands during session, often impulsively. Pt transferring from bed to recliner and back 2-3 times after completing ambulation. ?  ? ?Ambulation/Gait ?Ambulation/Gait assistance: Min assist ?Gait Distance (Feet): 200 Feet ?Assistive device: Rolling walker (2 wheels) ?Gait Pattern/deviations: Step-through pattern ?Gait velocity: reduced ?Gait velocity interpretation: <1.31 ft/sec, indicative of household ambulator ?  ?General Gait Details: pt with slowed step-through gait, drifts laterally multiple times in hallway. Pt stops in hallway many times,closing eyes. Pt becomes mildly agitated when PT asks about symptoms when she closes her eyes. ? ? ?Stairs ?  ?  ?  ?  ?  ? ? ?Wheelchair Mobility ?  ? ?Modified Rankin (Stroke Patients Only) ?Modified Rankin (Stroke Patients Only) ?Pre-Morbid Rankin Score: No symptoms ?Modified Rankin: Moderately severe disability ? ? ?  ?Balance Overall balance assessment: Needs assistance ?Sitting-balance support: No upper extremity supported, Feet supported ?Sitting balance-Leahy Scale: Fair ?  ?  ?Standing balance support: No upper extremity supported, During functional activity ?Standing balance-Leahy Scale: Fair ?  ?  ?  ?  ?  ?  ?  ?  ?  ?  ?  ?  ?  ? ?  ?Cognition Arousal/Alertness: Awake/alert (brief periods of eyes closed in standing) ?Behavior During Therapy: Agitated, Restless ?Overall Cognitive Status: Impaired/Different from baseline ?Area of Impairment: Memory, Attention, Safety/judgement,  Awareness, Problem solving, Following commands ?  ?  ?  ?  ?  ?  ?  ?  ?  ?Current Attention Level: Focused ?Memory: Decreased short-term  memory, Decreased recall of precautions ?Following Commands: Follows one step commands consistently, Follows one step commands with increased time (at times pt choosing to not follow commands, not wanting to sit down) ?Safety/Judgement: Decreased awareness of safety, Decreased awareness of deficits ?Awareness: Emergent ?Problem Solving: Slow processing, Difficulty sequencing ?  ?  ?  ? ?  ?Exercises   ? ?  ?General Comments General comments (skin integrity, edema, etc.): VSS on RA, pt intermittently agitated. Pt frequently reports pain in head, buttocks and BLEs. Pt does not want to sit due to leg pain, requiring much education on the need to sit for safety purposes. ?  ?  ? ?Pertinent Vitals/Pain Pain Assessment ?Pain Assessment: Faces ?Faces Pain Scale: Hurts whole lot ?Pain Location: head, buttocks, BLE ?Pain Descriptors / Indicators: Aching ?Pain Intervention(s): Monitored during session  ? ? ?Home Living   ?  ?  ?  ?  ?  ?  ?  ?  ?  ?   ?  ?Prior Function    ?  ?  ?   ? ?PT Goals (current goals can now be found in the care plan section) Acute Rehab PT Goals ?Patient Stated Goal: return home ?Progress towards PT goals: Progressing toward goals ? ?  ?Frequency ? ? ? Min 4X/week ? ? ? ?  ?PT Plan Current plan remains appropriate  ? ? ?Co-evaluation   ?  ?  ?  ?  ? ?  ?AM-PAC PT "6 Clicks" Mobility   ?Outcome Measure ? Help needed turning from your back to your side while in a flat bed without using bedrails?: A Little ?Help needed moving from lying on your back to sitting on the side of a flat bed without using bedrails?: A Little ?Help needed moving to and from a bed to a chair (including a wheelchair)?: A Little ?Help needed standing up from a chair using your arms (e.g., wheelchair or bedside chair)?: A Little ?Help needed to walk in hospital room?: A Little ?Help needed climbing 3-5 steps with a railing? : Total ?6 Click Score: 16 ? ?  ?End of Session   ?Activity Tolerance: Patient limited by pain ?Patient  left: in chair;with call bell/phone within reach;with chair alarm set ?Nurse Communication: Mobility status ?PT Visit Diagnosis: Unsteadiness on feet (R26.81);Pain;Difficulty in walking, not elsewhere classified (R26.2) ?Pain - Right/Left: Right ?Pain - part of body: Leg (buttocks and posterior thigh) ?  ? ? ?Time: 7353-2992 ?PT Time Calculation (min) (ACUTE ONLY): 51 min ? ?Charges:  $Gait Training: 23-37 mins ?$Therapeutic Activity: 8-22 mins          ?          ? ?Arlyss Gandy, PT, DPT ?Acute Rehabilitation ?Pager: [phone] ?Office [phone] ? ? ? ?Arlyss Gandy ?09/07/2021, 12:49 PM ? ?

## 2021-09-08 DIAGNOSIS — R569 Unspecified convulsions: Secondary | ICD-10-CM

## 2021-09-08 LAB — GLUCOSE, CAPILLARY
Glucose-Capillary: 112 mg/dL — ABNORMAL HIGH (ref 70–99)
Glucose-Capillary: 115 mg/dL — ABNORMAL HIGH (ref 70–99)
Glucose-Capillary: 122 mg/dL — ABNORMAL HIGH (ref 70–99)
Glucose-Capillary: 129 mg/dL — ABNORMAL HIGH (ref 70–99)
Glucose-Capillary: 139 mg/dL — ABNORMAL HIGH (ref 70–99)
Glucose-Capillary: 141 mg/dL — ABNORMAL HIGH (ref 70–99)
Glucose-Capillary: 154 mg/dL — ABNORMAL HIGH (ref 70–99)

## 2021-09-08 MED ORDER — LISINOPRIL 20 MG PO TABS
20.0000 mg | ORAL_TABLET | Freq: Every day | ORAL | Status: DC
  Administered 2021-09-08 – 2021-09-12 (×5): 20 mg via ORAL
  Filled 2021-09-08 (×5): qty 1

## 2021-09-08 MED ORDER — OSMOLITE 1.5 CAL PO LIQD
996.0000 mL | ORAL | Status: DC
  Administered 2021-09-08: 1000 mL
  Administered 2021-09-10 – 2021-09-11 (×2): 996 mL
  Filled 2021-09-08 (×8): qty 1000

## 2021-09-08 NOTE — Progress Notes (Signed)
?NEUROSURGERY PROGRESS NOTE  ? ?Pt seen and examined. No complaints currently. ? ?EXAM: ?Temp:  [97.8 ?F (36.6 ?C)-98.3 ?F (36.8 ?C)] 97.8 ?F (36.6 ?C) (05/11 1200) ?Pulse Rate:  [86] 86 (05/10 1400) ?Resp:  [15-29] 20 (05/11 1200) ?BP: (112-164)/(50-127) 126/75 (05/11 1200) ?SpO2:  [95 %-98 %] 96 % (05/11 1200) ?Weight:  [83.8 kg] 83.8 kg (05/11 0500) ?Intake/Output   ?   05/10 0701 ?05/11 0700 05/11 0701 ?05/12 0700  ? P.O.    ? NG/GT 426   ? Total Intake(mL/kg) 426 (5.1)   ? Urine (mL/kg/hr) 600 (0.3)   ? Stool 0   ? Total Output 600   ? Net -174   ?     ? Urine Occurrence 7 x   ? Stool Occurrence 2 x 2 x  ?  ?Awake, alert, oriented ?fluent speech ?Good strength throughout ?Wounds c/d/i ? ?LABS: ?Lab Results  ?Component Value Date  ? CREATININE 0.55 09/07/2021  ? BUN 26 (H) 09/07/2021  ? NA 136 09/07/2021  ? K 3.9 09/07/2021  ? CL 102 09/07/2021  ? CO2 25 09/07/2021  ? ?Lab Results  ?Component Value Date  ? WBC 16.5 (H) 09/07/2021  ? HGB 12.1 09/07/2021  ? HCT 36.8 09/07/2021  ? MCV 96.6 09/07/2021  ? PLT 341 09/07/2021  ? ?TCD: ?4/30 ?CK         Right  ?Left  ? *  ?*  ? *  ?*  ? *  ?*  ? 30  ?37  ? *  ?*  ? 11  ?16  ? *  ?   ?  ?5/1 ?RS         Right  ?Left  ? *  ?69  ? *  ?-82  ? 27  ?*  ? 19  ?26  ? 38  ?41  ? *  ?*  ? *  ?   ?  ?5/3 ?RS         Right  ?Left  ? 108  ?95  ? -53  ?-35  ? *  ?*  ? 29  ?34  ? 50  ?53  ? -32  ?-23  ? *  ?   ?  ?5/5, ?RH         Right  ?Left  ? 80  ?97  ? -19  ?-21  ? 26  ?17  ? 19  ?24  ? 26  ?63  ? -36  ?-30  ? -13  ?   ?  ? 5/8 ?CK         Right  ?Left  ?  17  ? 95  ?  *  ?-34  ?  19  ? 33  ?  26  ? 29  ?  *  ? *  ?  *  ? *  ?  *  ?   ?  ? 5/10 ?RH         Right  ?Left  ?  63  ? 68  ?  *  ? -24  ?  29  ? 22  ?  23  ? 28  ?  30  ? 40  ?  -19  ? -21  ?  -21  ?   ?  ? ? ?IMPRESSION: ?- 66 y.o. female [place] d# 12 s/p coil embo L PCom aneurysm, neurologically stable, non-focal exam. No clinical or ultrasonographic concern for spasm. ?- SZ provoked by [facility] ? ?PLAN: ?-  Medically stable  for d/c to CIR when bed available ?- Cont Nimotop to finish 21 days ?- Cont Keppra ?- Will plan on cranioplasty in the subacute setting, after discharge from CIR likely 6-8weeks from now. ? ?Consuella Lose, MD ?[facility]  ? ?

## 2021-09-08 NOTE — Progress Notes (Signed)
Nutrition Follow-up ? ?DOCUMENTATION CODES:  ? ?Not applicable ? ?INTERVENTION:  ? ? ?- Offer Ensure Enlive/Ensure Plus High     Protein po BID ?- Encourage PO intake  ? ?- Nocturnal tube feeds via Cortrak: ?Increase Osmolite 1.5 to 83 ml/hr x 12 hours (996 ml/day) 1800-0600 ?ProSource TF 90 ml TID ? ?Tube feeding regimen provides 1734 kcal, 128 grams of protein, and 756 ml of H2O.  ? ?Meets 96% of kcal needs and 100% of protein needs - adjust as appropriate.  ? ?NUTRITION DIAGNOSIS:  ? ?Inadequate oral intake related to inability to eat as evidenced by NPO status. ?Progressing.  ? ?GOAL:  ? ?Patient will meet greater than or equal to 90% of their needs ?Progressing with diet and TF ? ?MONITOR:  ? ?Vent status, Labs, Weight trends, TF tolerance, Skin, I & O's ? ?REASON FOR ASSESSMENT:  ? ?Ventilator, Consult ?Enteral/tube feeding initiation and management ? ?ASSESSMENT:  ? ?66 year old female who presented to the ED on 4/29 after being found unresponsive by family. PMH of angina. Pt with AMS in the ED and required intubation. Pt admitted with SAH, SDH. ? ? ?Pt discussed during ICU rounds and with RN. Pt not eating, Breakfast and lunch untouched. Pt states she likes ensure but is confused. No family at bedside. Pt in chair in arm restraints and posey.  ? ? ?04/29 - s/p aneurysm coiling, s/p L craniotomy for evacuation of SDH with implantation of bone flap in L abdominal wall ?04/30 - TF started ?05/01 - Cortrak placed (tip in distal stomach) ?05/02 - extubated ?05/03 - diet advanced to Full Liquids  ?05/06 - TF changed to nocturnal  ?05/11 - TF increased  ? ?Admit weight: 81.6 kg ?Current weight: 83.4 kg ? ?Medications reviewed and include: nutrisource fiber, SSI q 4 hours ?  ?Labs reviewed:  ?CBG's: 89-154 x 24 hours ? ? ?Diet Order:   ?Diet Order   ? ?       ?  DIET DYS 3 Room service appropriate? Yes with Assist; Fluid consistency: Thin  Diet effective now       ?  ? ?  ?  ? ?  ? ? ?EDUCATION NEEDS:  ? ?No  education needs have been identified at this time ? ?Skin:  Skin Assessment: ?Skin Integrity Issues: ?Incisions: groin, abdomen, L head ? ?Last BM:  5/4 type 7 x 3 small ? ?Height:  ? ?Ht Readings from Last 1 Encounters:  ?08/27/21 5\' 6"  (1.676 m)  ? ? ?Weight:  ? ?Wt Readings from Last 1 Encounters:  ?09/08/21 83.8 kg  ? ? ?BMI:  Body mass index is 29.82 kg/m?. ? ?Estimated Nutritional Needs:  ? ?Kcal:  1800-2000 ? ?Protein:  105-125 grams ? ?Fluid:  1.8-2.0 L ? ? ?11/08/21., RD, LDN, CNSC ?See AMiON for contact information  ? ?

## 2021-09-08 NOTE — Progress Notes (Addendum)
Inpatient Rehab Admissions Coordinator:  ? ?Following for my colleague Megan Salon.  Note pending repeat CTH today and mobilizing well with therapy.  Do not need insurance auth for [employer].  Will continue to follow for readiness, and potential admit pending bed availability.    ? ?1322: Stopped by to see pt.  She's restless, in waist/wrist/hand restraints, but up in chair.  Disoriented.  Per Dr. Conchita Paris medically stable for d/c to CIR when bed available.  Discussed with Sidney Ace, RN CM, beds on CIR limited so may need to investigate other AIR facilities.  CIR will continue to follow for progress and potential admit pending bed availability.  ? ?Estill Dooms, PT, DPT ?Admissions Coordinator ?[phone] ?09/08/21  ?10:06 AM ? ?

## 2021-09-08 NOTE — Progress Notes (Signed)
Physical Therapy Treatment ?Patient Details ?Name: Kayla Maynard ?MRN: [id number] ?DOB: December 22, 1955 ?Today's Date: 09/08/2021 ? ? ?History of Present Illness Ms. Encalade is a 66 y/o woman who presented with LOC and was found to have seizures x3 in the ED. CT showed extensive subarachnoid hemorrhage bilaterally and large SDH L frontal/ parietal/ temporal. 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  PMH: angina, back surgery ? ?  ?PT Comments  ? ? Pt is able to ambulate for increased distances however she continues to bump into objects with walker, is easily distracted, and has no awareness of her current deficits or precautions. Pt demonstrates significant expressive communication deficits, unable to effectively communicate her desires at this time. Pt will benefit from continued acute PT services in an effort to improve dynamic balance and reduce falls risk. PT continues to recommend AIR.  ?Recommendations for follow up therapy are one component of a multi-disciplinary discharge planning process, led by the attending physician.  Recommendations may be updated based on patient status, additional functional criteria and insurance authorization. ? ?Follow Up Recommendations ? Acute inpatient rehab (3hours/day) ?  ?  ?Assistance Recommended at Discharge Intermittent Supervision/Assistance  ?Patient can return home with the following A lot of help with bathing/dressing/bathroom;Assistance with cooking/housework;Assist for transportation;Help with stairs or ramp for entrance;A little help with walking and/or transfers ?  ?Equipment Recommendations ? Rolling walker (2 wheels)  ?  ?Recommendations for Other Services   ? ? ?  ?Precautions / Restrictions Precautions ?Precautions: Fall ?Precaution Comments: L crani, bone flap in L abdominal wall ?Restrictions ?Weight Bearing Restrictions: No  ?  ? ?Mobility ? Bed Mobility ?  ?  ?  ?  ?  ?  ?  ?  ?  ? ?Transfers ?Overall transfer level: Needs  assistance ?  ?Transfers: Sit to/from Stand ?Sit to Stand: Min guard ?  ?  ?  ?  ?  ?  ?  ? ?Ambulation/Gait ?Ambulation/Gait assistance: Min guard ?Gait Distance (Feet): 250 Feet ?Assistive device: Rolling walker (2 wheels) ?Gait Pattern/deviations: Step-through pattern ?Gait velocity: reduced ?Gait velocity interpretation: <1.31 ft/sec, indicative of household ambulator ?  ?General Gait Details: pt bumping into many objects in hallway with poor awareness. Pt is easily distracted, looking into every room on unit she passes when ambulating. Pt bumps into at least 5 chairs, nearly one person (person moved), and one cart. Pt ramming walker into 2 chairs in an effort to mobilize through narrow space, PT provides verbal cues to turn around. Pt often lifting walker over ojects at times, but declines ambulating without device ? ? ?Stairs ?  ?  ?  ?  ?  ? ? ?Wheelchair Mobility ?  ? ?Modified Rankin (Stroke Patients Only) ?Modified Rankin (Stroke Patients Only) ?Pre-Morbid Rankin Score: No symptoms ?Modified Rankin: Moderately severe disability ? ? ?  ?Balance Overall balance assessment: Needs assistance ?Sitting-balance support: No upper extremity supported, Feet supported ?Sitting balance-Leahy Scale: Fair ?  ?  ?Standing balance support: Single extremity supported, Reliant on assistive device for balance ?Standing balance-Leahy Scale: Poor ?Standing balance comment: reliant on UE support ?  ?  ?  ?  ?  ?  ?  ?  ?  ?  ?  ?  ? ?  ?Cognition Arousal/Alertness: Awake/alert ?Behavior During Therapy: Impulsive ?Overall Cognitive Status: Difficult to assess ?Area of Impairment: Memory, Following commands, Safety/judgement, Awareness, Problem solving, Attention ?  ?  ?  ?  ?  ?  ?  ?  ?  ?  Current Attention Level: Focused ?Memory: Decreased recall of precautions, Decreased short-term memory ?Following Commands: Follows one step commands with increased time, Follows multi-step commands inconsistently ?Safety/Judgement: Decreased  awareness of safety, Decreased awareness of deficits ?Awareness: Intellectual ?Problem Solving: Slow processing, Difficulty sequencing, Requires verbal cues ?  ?  ?  ? ?  ?Exercises   ? ?  ?General Comments General comments (skin integrity, edema, etc.): VSS on RA, impulsive and very poor awareness ?  ?  ? ?Pertinent Vitals/Pain Pain Assessment ?Pain Assessment: Faces ?Faces Pain Scale: Hurts little more ?Pain Location: head ?Pain Descriptors / Indicators: Grimacing ?Pain Intervention(s): Monitored during session  ? ? ?Home Living   ?  ?  ?  ?  ?  ?  ?  ?  ?  ?   ?  ?Prior Function    ?  ?  ?   ? ?PT Goals (current goals can now be found in the care plan section) Acute Rehab PT Goals ?Patient Stated Goal: return home ?Progress towards PT goals: Not progressing toward goals - comment (very poor awareness and cognition) ? ?  ?Frequency ? ? ? Min 3X/week ? ? ? ?  ?PT Plan Current plan remains appropriate  ? ? ?Co-evaluation   ?  ?  ?  ?  ? ?  ?AM-PAC PT "6 Clicks" Mobility   ?Outcome Measure ? Help needed turning from your back to your side while in a flat bed without using bedrails?: A Little ?Help needed moving from lying on your back to sitting on the side of a flat bed without using bedrails?: A Little ?Help needed moving to and from a bed to a chair (including a wheelchair)?: A Little ?Help needed standing up from a chair using your arms (e.g., wheelchair or bedside chair)?: A Little ?Help needed to walk in hospital room?: A Little ?Help needed climbing 3-5 steps with a railing? : Total ?6 Click Score: 16 ? ?  ?End of Session   ?Activity Tolerance: Patient tolerated treatment well ?Patient left: in chair;with call bell/phone within reach;with chair alarm set;with restraints reapplied ?Nurse Communication: Mobility status ?PT Visit Diagnosis: Unsteadiness on feet (R26.81);Pain;Difficulty in walking, not elsewhere classified (R26.2) ?Pain - part of body:  (head) ?  ? ? ?Time: 4332-9518 ?PT Time Calculation (min)  (ACUTE ONLY): 33 min ? ?Charges:  $Gait Training: 23-37 mins          ?          ? ?Arlyss Gandy, PT, DPT ?Acute Rehabilitation ?Pager: [phone] ?Office [phone] ? ? ? ?Arlyss Gandy ?09/08/2021, 1:42 PM ? ?

## 2021-09-08 NOTE — Plan of Care (Signed)
?  Problem: Education: ?Goal: Knowledge of disease or condition will improve ?Outcome: Progressing ?  ?Problem: Self-Care: ?Goal: Ability to participate in self-care as condition permits will improve ?Outcome: Progressing ?  ?Problem: Spontaneous Subarachnoid Hemorrhage Tissue Perfusion: ?Goal: Complications of Spontaneous Subarachnoid Hemorrhage will be minimized ?Outcome: Progressing ?  ?Problem: Safety: ?Goal: Non-violent Restraint(s) ?Outcome: Progressing ?  ?

## 2021-09-08 NOTE — Progress Notes (Signed)
? ?NAME:  Kayla Maynard, MRN:  [id number], DOB:  07/21/1955, LOS: 12 ?ADMISSION DATE:  08/27/2021, CONSULTATION DATE:  08/27/2021 ?REFERRING MD:  Wynetta Emery - NSGY, CHIEF COMPLAINT:  SAH, endotracheally intubated  ? ?History of Present Illness:  ?66 year old woman who presented to [facility] ED 4/29 after loss of consciousness at home. Family actually started CPR per 911 advice, though unclear if pulses were actually lost. Description of hypertonicity and myoclonus more concerning for seizure. On EMS arrival patient awake but hypoglycemic, received D10. PMHx angina. ? ?In ED, patient denied history of seizures. Endorsed HA and neck pain, worse x 2 weeks. Had 2 additional seizures in ED. Intubated for airway protection. Found to have ICH - SAH, SDH. Became bradycardic and was subsequently started on hypertonic saline 3% and Cleviprex. ? ?Transferred from [facility] ED to [facility] ED for higher level of care/NSGY intervention. Underwent craniotomy and bone flap with NSGY (Dr. Wynetta Emery) 4/29. PCCM consulted for assistance with MV/medical management. Extubated 5/2. ? ?Pertinent Medical History:  ?Angina  ? ?Significant Hospital Events: ?Including procedures, antibiotic start and stop dates in addition to other pertinent events   ?4/29  multiple seizures. Hypoglycemia (factitious). Intubated. Found to have SAH, SDH. 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  Dominant left posterior communicating artery patent ?4/29 Left-sided pterional craniotomy for evacuation of left-sided subdural hematoma with implantation of the bone flap in the left abdominal wall ?4/30 considerable improvement postintervention with near complete resolution of midline shift. ?5/1 weaned 11hrs but still too sleepy for extubation, Intracranial drain removed by NS this morning. Tmax 101 ?5/2 aline  removed/ not working, more awake/extubated  ?5/4 working with PT  ?5/5 working with SLP.  Mild vasospasm on TCD ?5/6 adding immodium  ?5/7 diarrhea improved, no overnight events ?5/8 Ongoing HA/head pain. No significant events overnight. C/o seeing "red flowers" in L eye. ?5/9 Improved mobility, less head pain today. Up with assistance, tolerating well. ?5/10 increased agitation today, up getting out of bed and fall risk ? ?Interim History / Subjective:  ?Patient remained agitated and restless overnight, trying to come out of bed ?She required Posey and this morning she was found naked and does not want clothing ? ? ?Objective:  ?Blood pressure (!) 156/73, pulse 86, temperature 98.1 ?F (36.7 ?C), temperature source Oral, resp. rate 20, height 5\' 6"  (1.676 m), weight 83.8 kg, SpO2 97 %. ?   ?   ? ?Intake/Output Summary (Last 24 hours) at 09/08/2021 1119 ?Last data filed at 09/08/2021 0600 ?Gross per 24 hour  ?Intake 426 ml  ?Output 600 ml  ?Net -174 ml  ? ?Filed Weights  ? 09/05/21 0500 09/06/21 0500 09/08/21 0500  ?Weight: 83.4 kg 83.4 kg 83.8 kg  ? ? ? ?Physical exam: ?General: Chronically ill-appearing female, lying on the bed ?HEENT: S/p left craniectomy, surgical incision looks clean, eyes anicteric.  moist mucus membranes ?Neuro: Alert, awake following commands, confused and agitated ?Chest: Coarse breath sounds, no wheezes or rhonchi ?Heart: Regular rate and rhythm, no murmurs or gallops ?Abdomen: Soft, nontender, nondistended, bowel sounds present ?Skin: No rash ? ? ?Resolved Hospital Problem List:  ?Acute Respiratory Failure with hypoxia likely secondary to aspiration PNA-completed course of Cefepime and now on RA ? 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 ?Dysphagia ?Continue dysphagia diet ?Continue tube feeds ?Dietitian is following ? ? ?Best Practice: (right click and "Reselect all SmartList Selections" daily)  ? ?Diet/type: tubefeeds and Regular consistency (see orders) ?DVT prophylaxis: prophylactic heparin   ?GI prophylaxis: PPI ?Lines: N/A ?Foley:  N/A ?Code Status:  full code ?Last date of multidisciplinary goals of care discussion-- Patient and family updated at bedside 5/9 ? ?Critical care time: N/A  ? ?  ?Cheri Fowler MD ?[facility] ?See Amion for pager ?If no response to pager, please call [phone] until 7pm ?After 7pm, Please call E-link [phone] ? ? ?

## 2021-09-08 NOTE — Progress Notes (Signed)
Occupational Therapy Treatment ?Patient Details ?Name: Kayla Maynard ?MRN: [id number] ?DOB: 1955/06/13 ?Today's Date: 09/08/2021 ? ? ?History of present illness Kayla Maynard is a 66 y/o woman who presented with LOC and was found to have seizures x3 in the ED. CT showed extensive subarachnoid hemorrhage bilaterally and large SDH L frontal/ parietal/ temporal. 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  PMH: angina, back surgery ?  ?OT comments ? Pt progressing towards OT goals. Pt less impulsive than previous PT session, but still requiring cues for sequencing during standing grooming in the bathroom. Pt really enjoyed listening to [facility] music (stated she liked this better than watching TV). Pt willing to use RW when it was pointed out to her that she is reaching out for external support for walking. Pt very cooperative following one step commands with 80% accuracy, needs verbal cues for other 20%. DId NOT try and pull any lines out during session. At end of session, Pt in chair with call bell and all restraints back in place (posey belt alarm, BUE restraints, mitt on L hand) Pt continues to require AIR level therapy for balance, cognition, activity tolerance, and safety during ADL/IADL to return to PLOF.  ? ?Recommendations for follow up therapy are one component of a multi-disciplinary discharge planning process, led by the attending physician.  Recommendations may be updated based on patient status, additional functional criteria and insurance authorization. ?   ?Follow Up Recommendations ? Acute inpatient rehab (3hours/day)  ?  ?Assistance Recommended at Discharge Frequent or constant Supervision/Assistance  ?Patient can return home with the following ? A lot of help with walking and/or transfers;A lot of help with bathing/dressing/bathroom;Assistance with cooking/housework;Assistance with feeding;Direct supervision/assist for medications management;Direct supervision/assist for  financial management;Assist for transportation;Help with stairs or ramp for entrance ?  ?Equipment Recommendations ? Other (comment) (defer to next venue of care)  ?  ?Recommendations for Other Services Rehab consult ? ?  ?Precautions / Restrictions Precautions ?Precautions: Fall ?Precaution Comments: L crani, bone flap in L abdominal wall ?Restrictions ?Weight Bearing Restrictions: No  ? ? ?  ? ?Mobility Bed Mobility ?Overal bed mobility: Needs Assistance ?Bed Mobility: Sit to Supine ?Rolling: Min assist ?  ?  ?  ?  ?General bed mobility comments: min A for trunk elevation, Pt reaching out for assist ?  ? ?Transfers ?Overall transfer level: Needs assistance ?Equipment used: Rolling walker (2 wheels), None ?Transfers: Sit to/from Stand, Bed to chair/wheelchair/BSC ?Sit to Stand: Min guard ?  ?  ?Step pivot transfers: Min guard ?  ?  ?General transfer comment: Pt did well waiting for therapy while OT got lines together and worked on restraints, better impulse control for mobility today from previous session ?  ?  ?Balance Overall balance assessment: Needs assistance ?Sitting-balance support: No upper extremity supported, Feet supported ?Sitting balance-Leahy Scale: Fair ?  ?  ?Standing balance support: No upper extremity supported, During functional activity ?Standing balance-Leahy Scale: Fair ?Standing balance comment: reaches out for external support, cues for RW ?  ?  ?  ?  ?  ?  ?  ?  ?  ?  ?  ?   ? ?ADL either performed or assessed with clinical judgement  ? ?ADL Overall ADL's : Needs assistance/impaired ?  ?  ?Grooming: Oral care;Minimal assistance;Standing;Wash/dry face;Wash/dry hands ?Grooming Details (indicate cue type and reason): standing at sink in bathroom, assist to manage lines and problem solve working around feeding tube ?Upper Body Bathing: Minimal assistance;Standing ?Upper Body Bathing Details (  indicate cue type and reason): for back in standing at sink ?  ?  ?  ?  ?Lower Body Dressing: Minimal  assistance;Sitting/lateral leans ?Lower Body Dressing Details (indicate cue type and reason): adjusting sock sitting EOB ?Toilet Transfer: Rolling walker (2 wheels);Ambulation;Regular Toilet;Minimal assistance ?Toilet Transfer Details (indicate cue type and reason): assist for cues, and to manage RW ?Toileting- Clothing Manipulation and Hygiene: Min guard;Sitting/lateral lean ?Toileting - Clothing Manipulation Details (indicate cue type and reason): provided with warm wash cloths and patient appreciated it ?  ?  ?Functional mobility during ADLs: Rolling walker (2 wheels);Minimal assistance ?General ADL Comments: continues to be impulsive and need cues for problem solving ?  ? ?Extremity/Trunk Assessment Upper Extremity Assessment ?Upper Extremity Assessment: Generalized weakness ?  ?  ?  ?  ?  ? ?Vision   ?  ?  ?Perception   ?  ?Praxis   ?  ? ?Cognition Arousal/Alertness: Awake/alert ?Behavior During Therapy: [facility] for tasks assessed/performed, Restless ?Overall Cognitive Status: Impaired/Different from baseline ?Area of Impairment: Memory, Attention, Safety/judgement, Awareness, Problem solving, Following commands ?  ?  ?  ?  ?  ?  ?  ?  ?  ?Current Attention Level: Sustained ?Memory: Decreased short-term memory, Decreased recall of precautions ?Following Commands: Follows one step commands consistently ?Safety/Judgement: Decreased awareness of safety, Decreased awareness of deficits ?Awareness: Emergent ?Problem Solving: Slow processing, Difficulty sequencing, Requires verbal cues ?General Comments: Pt cooperative throughout session today. She needed cues for problem solving opening containers, and prompts for word finding today. RN staff reports that she has been irritable, she vocalized to me she is tired of being "tied down" ?  ?  ?   ?Exercises   ? ?  ?Shoulder Instructions   ? ? ?  ?General Comments VSS on RA, Pt restless reporting pain in head, BLE, and bottom  ? ? ?Pertinent Vitals/ Pain       Pain  Assessment ?Pain Assessment: 0-10 ?Pain Score: 5  ?Pain Location: head, buttocks, BLE ?Pain Descriptors / Indicators: Aching, Restless, Headache ?Pain Intervention(s): Monitored during session, Repositioned ? ?Home Living   ?  ?  ?  ?  ?  ?  ?  ?  ?  ?  ?  ?  ?  ?  ?  ?  ?  ?  ? ?  ?Prior Functioning/Environment    ?  ?  ?  ?   ? ?Frequency ? Min 2X/week  ? ? ? ? ?  ?Progress Toward Goals ? ?OT Goals(current goals can now be found in the care plan section) ? Progress towards OT goals: Progressing toward goals ? ?Acute Rehab OT Goals ?Patient Stated Goal: have more freedom ?OT Goal Formulation: With patient ?Time For Goal Achievement: 09/20/21 ?Potential to Achieve Goals: Good  ?Plan Discharge plan remains appropriate   ? ?Co-evaluation ? ? ?   ?  ?  ?  ?  ? ?  ?AM-PAC OT "6 Clicks" Daily Activity     ?Outcome Measure ? ? Help from another person eating meals?: A Little ?Help from another person taking care of personal grooming?: A Little ?Help from another person toileting, which includes using toliet, bedpan, or urinal?: A Lot ?Help from another person bathing (including washing, rinsing, drying)?: A Lot ?Help from another person to put on and taking off regular upper body clothing?: A Little ?Help from another person to put on and taking off regular lower body clothing?: A Lot ?6 Click Score: 15 ? ?  ?  End of Session Equipment Utilized During Treatment: Gait belt;Rolling walker (2 wheels) ? ?OT Visit Diagnosis: Unsteadiness on feet (R26.81);Other abnormalities of gait and mobility (R26.89);Muscle weakness (generalized) (M62.81);Pain ?Pain - part of body:  (headache, BLE, buttocks) ?  ?Activity Tolerance Patient tolerated treatment well ?  ?Patient Left in chair;with call bell/phone within reach;with chair alarm set;with restraints reapplied ?  ?Nurse Communication Mobility status ?  ? ?   ? ?Time: [id number] ?OT Time Calculation (min): 45 min ? ?Charges: OT General Charges ?$OT Visit: 1 Visit ?OT Treatments ?$Self  Care/Home Management : 8-22 mins ?$Therapeutic Activity: 23-37 mins ? ?Nyoka CowdenLaura H OTR/L ?Acute Rehabilitation Services ?Pager: [phone] ?Office: [phone] ? ?Evern BioLaura J Maloree Uplinger ?09/08/2021, 1:28 PM ?

## 2021-09-08 NOTE — Progress Notes (Signed)
eLink Physician-Brief Progress Note ?Patient Name: Kayla Maynard ?DOB: Oct 18, 1955 ?MRN: [id number] ? ? ?Date of Service ? 09/08/2021  ?HPI/Events of Note ? PT standing up from bed, at increased risk for fall and injury  ?eICU Interventions ? Posey belt ordered.  ?Will continue to reorient.   ? ? ? ?  ? ?[name] ?09/08/2021, 12:14 AM ?

## 2021-09-09 ENCOUNTER — Inpatient Hospital Stay (HOSPITAL_COMMUNITY): Payer: Medicare Other

## 2021-09-09 DIAGNOSIS — J9621 Acute and chronic respiratory failure with hypoxia: Secondary | ICD-10-CM | POA: Diagnosis not present

## 2021-09-09 DIAGNOSIS — I608 Other nontraumatic subarachnoid hemorrhage: Secondary | ICD-10-CM | POA: Diagnosis not present

## 2021-09-09 LAB — GLUCOSE, CAPILLARY
Glucose-Capillary: 139 mg/dL — ABNORMAL HIGH (ref 70–99)
Glucose-Capillary: 115 mg/dL — ABNORMAL HIGH (ref 70–99)
Glucose-Capillary: 136 mg/dL — ABNORMAL HIGH (ref 70–99)
Glucose-Capillary: 110 mg/dL — ABNORMAL HIGH (ref 70–99)
Glucose-Capillary: 97 mg/dL (ref 70–99)
Glucose-Capillary: 105 mg/dL — ABNORMAL HIGH (ref 70–99)

## 2021-09-09 LAB — CBC
HCT: 37 % (ref 36.0–46.0)
Hemoglobin: 12.7 g/dL (ref 12.0–15.0)
MCH: 32.2 pg (ref 26.0–34.0)
MCHC: 34.3 g/dL (ref 30.0–36.0)
MCV: 93.7 fL (ref 80.0–100.0)
Platelets: 410 10*3/uL — ABNORMAL HIGH (ref 150–400)
RBC: 3.95 MIL/uL (ref 3.87–5.11)
RDW: 12 % (ref 11.5–15.5)
WBC: 17.1 10*3/uL — ABNORMAL HIGH (ref 4.0–10.5)
nRBC: 0 % (ref 0.0–0.2)

## 2021-09-09 LAB — BASIC METABOLIC PANEL
Anion gap: 10 (ref 5–15)
BUN: 30 mg/dL — ABNORMAL HIGH (ref 8–23)
CO2: 20 mmol/L — ABNORMAL LOW (ref 22–32)
Calcium: 9.4 mg/dL (ref 8.9–10.3)
Chloride: 103 mmol/L (ref 98–111)
Creatinine, Ser: 0.69 mg/dL (ref 0.44–1.00)
GFR, Estimated: >60 mL/min (ref >60)
Glucose, Bld: 166 mg/dL — ABNORMAL HIGH (ref 70–99)
Potassium: 4.3 mmol/L (ref 3.5–5.1)
Sodium: 133 mmol/L — ABNORMAL LOW (ref 135–145)

## 2021-09-09 NOTE — Progress Notes (Signed)
? ?NAME:  Kayla Maynard, MRN:  [id number], DOB:  [birth year], LOS: 82 ?ADMISSION DATE:  08/27/2021, CONSULTATION DATE:  08/27/2021 ?REFERRING MD:  Saintclair Halsted - NSGY, CHIEF COMPLAINT:  SAH, endotracheally intubated  ? ?History of Present Illness:  ?66 year old woman who presented to [facility] ED 4/29 after loss of consciousness at home. Family actually started CPR per 911 advice, though unclear if pulses were actually lost. Description of hypertonicity and myoclonus more concerning for seizure. On EMS arrival patient awake but hypoglycemic, received D10. PMHx angina. ? ?In ED, patient denied history of seizures. Endorsed HA and neck pain, worse x 2 weeks. Had 2 additional seizures in ED. Intubated for airway protection. Found to have ICH - SAH, SDH. Became bradycardic and was subsequently started on hypertonic saline 3% and Cleviprex. ? ?Transferred from [facility] ED to [facility] ED for higher level of care/NSGY intervention. Underwent craniotomy and bone flap with NSGY (Dr. Saintclair Halsted) 4/29. PCCM consulted for assistance with MV/medical management. Extubated 5/2. ? ?Pertinent Medical History:  ?Angina  ? ?Significant Hospital Events: ?Including procedures, antibiotic start and stop dates in addition to other pertinent events   ?4/29  multiple seizures. Hypoglycemia (factitious). Intubated. Found to have SAH, SDH. 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  Dominant left posterior communicating artery patent ?4/29 Left-sided pterional craniotomy for evacuation of left-sided subdural hematoma with implantation of the bone flap in the left abdominal wall ?4/30 considerable improvement postintervention with near complete resolution of midline shift. ?5/1 weaned 11hrs but still too sleepy for extubation, Intracranial drain removed by NS this morning. Tmax 101 ?5/2 aline  removed/ not working, more awake/extubated  ?5/4 working with PT  ?5/5 working with SLP.  Mild vasospasm on TCD ?5/6 adding immodium  ?5/7 diarrhea improved, no overnight events ?5/8 Ongoing HA/head pain. No significant events overnight. C/o seeing "red flowers" in L eye. ?5/9 Improved mobility, less head pain today. Up with assistance, tolerating well. ?5/10 increased agitation today, up getting out of bed and fall risk ?5/12, stable no acute events ? ?Interim History / Subjective:  ?No acute events other than confusion and trying to get out of bed at night requiring posey belt and wrist restraints ? ? ?Objective:  ?Blood pressure (!) 149/85, pulse 94, temperature 97.8 ?F (36.6 ?C), temperature source Oral, resp. rate (!) 22, height 5\' 6"  (1.676 m), weight 79 kg, SpO2 97 %. ?   ?   ? ?Intake/Output Summary (Last 24 hours) at 09/09/2021 0749 ?Last data filed at 09/09/2021 0600 ?Gross per 24 hour  ?Intake 982.17 ml  ?Output --  ?Net 982.17 ml  ? ? ?Filed Weights  ? 09/06/21 0500 09/08/21 0500 09/09/21 0500  ?Weight: 83.4 kg 83.8 kg 79 kg  ? ? ? ?General:  chronically ill-appearing F, awake, confused, no acute distress ?HEENT: MM pink/moist, NGT in place ?Neuro: awake, confused, speech clear, wants to leave the hospital, follows commands ?CV: s1s2 rrr, no m/r/g ?PULM:  clear bilaterally on RA in no distress ?GI: soft, bsx4 active, soft, bone flap in place ?Extremities: warm/dry, no edema  ?Skin: no rashes or lesions ? ? ?Glu 115 ? ? ?Resolved Hospital Problem List:  ?Acute Respiratory Failure with hypoxia likely secondary to aspiration PNA-completed course of Cefepime and now on RA ? 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 ?Dysphagia ?Pt with minimal appetite prior to arrival per family ?-Continue dysphagia diet ?-Continue tube feeds ?-Dietitian is following ? ? ?Best Practice: (right click and "Reselect all SmartList Selections" daily)  ? ?Diet/type: tubefeeds and Regular consistency (see orders) ?DVT prophylaxis: prophylactic heparin   ?GI prophylaxis: PPI ?Lines: N/A ?Foley:  N/A ?Code Status:  full code ?Last date of multidisciplinary goals of care discussion-- Patient and family updated at bedside 5/9, per primary ? ?Critical care time: N/A  ? ? Otilio Carpen Raquelle Pietro, PA-C ?[facility] ?See Amion for pager ?If no response to pager , please call [phone] until 7pm ?After 7:00 pm call Elink  [phone] ? ? ? ?

## 2021-09-09 NOTE — Progress Notes (Signed)
Transcranial Doppler ?  ?Date POD PCO2 HCT BP   MCA ACA PCA OPHT SIPH VERT Basilar  ?4/30 ?CK         Right  ?Left  ? *  ?*  ? *  ?*  ? *  ?*  ? 30  ?37  ? *  ?*  ? 11  ?16  ? *  ?   ?  ?5/1 ?RS         Right  ?Left  ? *  ?69  ? *  ?-82  ? 27  ?*  ? 19  ?26  ? 38  ?41  ? *  ?*  ? *  ?   ?  ?5/3 ?RS         Right  ?Left  ? 108  ?95  ? -53  ?-35  ? *  ?*  ? 29  ?34  ? 50  ?53  ? -32  ?-23  ? *  ?   ?  ?5/5, ?RH         Right  ?Left  ? 80  ?97  ? -19  ?-21  ? 26  ?17  ? 19  ?24  ? 26  ?63  ? -36  ?-30  ? -13  ?   ?  ? 5/8 ?CK         Right  ?Left  ?  17  ? 95  ?  *  ?-34  ?  19  ? 33  ?  26  ? 29  ?  *  ? *  ?  *  ? *  ?  *  ?   ?  ? 5/10 ?RH         Right  ?Left  ?  63  ? 68  ?  *  ? -24  ?  29  ? 22  ?  23  ? 28  ?  30  ? 40  ?  -19  ? -21  ?  -21  ?   ?  ?5/12 MS         Right  ?Left  ? 23  ? 55  ?  -57  ? -78  ?  -25  ?23  ?  11  ? 25  ?  19  ? 44  ?  -11  ? -23  ?  *  ?   ?  ?  ?(*)=not insonated due to poor windows. ?  ? ? ?MCA = Middle Cerebral Artery      OPHT = Opthalmic Artery     BASILAR = Basilar Artery   ?ACA = Anterior Cerebral Artery     SIPH = Carotid Siphon ?PCA = Posterior Cerebral Artery   VERT = Verterbral Artery                  ?  ?Normal ?MCA = 62+\-12 ?ACA = 50+\-12 ?PCA = 42+\-23  ? ?Rt [place] = 0.79  LT [place]ard = 2.5 ? ?Eula Fried MHA, RVT, RDCS, RDMS ?09/09/2021 2:53 PM  ?

## 2021-09-09 NOTE — Progress Notes (Signed)
?NEUROSURGERY PROGRESS NOTE  ? ?Pt seen and examined. No complaints currently. Remains somewhat agitated overnight ? ?EXAM: ?Temp:  [97.8 ?F (36.6 ?C)-98.5 ?F (36.9 ?C)] 98.2 ?F (36.8 ?C) (05/12 0800) ?Pulse Rate:  [90-198] 94 (05/12 0639) ?Resp:  [12-24] 22 (05/12 8325) ?BP: (101-176)/(61-158) 149/85 (05/12 4982) ?SpO2:  [41 %-100 %] 97 % (05/12 0639) ?Weight:  [79 kg] 79 kg (05/12 0500) ?Intake/Output   ?   05/11 0701 ?05/12 0700 05/12 0701 ?05/13 0700  ? NG/GT 982.2   ? Total Intake(mL/kg) 982.2 (12.4)   ? Urine (mL/kg/hr)    ? Stool    ? Total Output    ? Net +982.2   ?     ? Urine Occurrence 3 x   ? Stool Occurrence 4 x   ?  ?Awake, alert, oriented x1 ?fluent speech but confused, difficulty naming objects ?Good strength throughout ?Wounds c/d/i ? ?LABS: ?Lab Results  ?Component Value Date  ? CREATININE 0.55 09/07/2021  ? BUN 26 (H) 09/07/2021  ? NA 136 09/07/2021  ? K 3.9 09/07/2021  ? CL 102 09/07/2021  ? CO2 25 09/07/2021  ? ?Lab Results  ?Component Value Date  ? WBC 17.1 (H) 09/09/2021  ? HGB 12.7 09/09/2021  ? HCT 37.0 09/09/2021  ? MCV 93.7 09/09/2021  ? PLT 410 (H) 09/09/2021  ? ?TCD: ?4/30 ?CK         Right  ?Left  ? *  ?*  ? *  ?*  ? *  ?*  ? 30  ?37  ? *  ?*  ? 11  ?16  ? *  ?   ?  ?5/1 ?RS         Right  ?Left  ? *  ?69  ? *  ?-82  ? 27  ?*  ? 19  ?26  ? 38  ?41  ? *  ?*  ? *  ?   ?  ?5/3 ?RS         Right  ?Left  ? 108  ?95  ? -53  ?-35  ? *  ?*  ? 29  ?34  ? 50  ?53  ? -32  ?-23  ? *  ?   ?  ?5/5, ?RH         Right  ?Left  ? 80  ?97  ? -19  ?-21  ? 26  ?17  ? 19  ?24  ? 26  ?63  ? -36  ?-30  ? -13  ?   ?  ? 5/8 ?CK         Right  ?Left  ?  17  ? 95  ?  *  ?-34  ?  19  ? 33  ?  26  ? 29  ?  *  ? *  ?  *  ? *  ?  *  ?   ?  ? 5/10 ?RH         Right  ?Left  ?  63  ? 68  ?  *  ? -24  ?  29  ? 22  ?  23  ? 28  ?  30  ? 40  ?  -19  ? -21  ?  -21  ?   ?  ? ? ?IMPRESSION: ?- 66 y.o. female SAH d# 13 s/p coil embo L PCom aneurysm, neurologically stable ?- Delirium does not appear to be related to spasm with  otherwise good exam and stable TCD velocities ?- SZ provoked by [facility] ? ?PLAN: ?- Remains medically stable for d/c to CIR when bed available ?- Cont Nimotop to finish 21 days ?- Cont Keppra ?- Will plan on cranioplasty in the subacute setting, after discharge from CIR likely 6-8 weeks from now. ? ?Lisbeth Renshaw, MD ?[facility]  ? ?

## 2021-09-09 NOTE — Progress Notes (Signed)
Inpatient Rehab Admissions Coordinator:  ? ?I do not have a CIR bed for this Pt. Today but will follow for potential admit pending bed availability. ? ?Megan Salon, MS, CCC-SLP ?Rehab Admissions Coordinator  ?[phone] (celll) ?[phone] (office) ? ?

## 2021-09-10 LAB — GLUCOSE, CAPILLARY
Glucose-Capillary: 128 mg/dL — ABNORMAL HIGH (ref 70–99)
Glucose-Capillary: 116 mg/dL — ABNORMAL HIGH (ref 70–99)
Glucose-Capillary: 111 mg/dL — ABNORMAL HIGH (ref 70–99)
Glucose-Capillary: 114 mg/dL — ABNORMAL HIGH (ref 70–99)
Glucose-Capillary: 95 mg/dL (ref 70–99)
Glucose-Capillary: 117 mg/dL — ABNORMAL HIGH (ref 70–99)

## 2021-09-10 NOTE — Progress Notes (Signed)
She looks really good this morning.  She is awake and alert and getting out of bed to go to the bathroom.  Her incision is clean dry and intact.  She appears to be nonfocal.  Awaiting CIR. ?

## 2021-09-10 NOTE — Progress Notes (Signed)
? ?NAME:  Kayla Maynard, MRN:  [id number], DOB:  15-Feb-1956, LOS: 14 ?ADMISSION DATE:  08/27/2021, CONSULTATION DATE:  08/27/2021 ?REFERRING MD:  Wynetta Emery - NSGY, CHIEF COMPLAINT:  SAH, endotracheally intubated  ? ?History of Present Illness:  ?66 year old woman who presented to [facility] ED 4/29 after loss of consciousness at home. Family actually started CPR per 911 advice, though unclear if pulses were actually lost. Description of hypertonicity and myoclonus more concerning for seizure. On EMS arrival patient awake but hypoglycemic, received D10. PMHx angina. ? ?In ED, patient denied history of seizures. Endorsed HA and neck pain, worse x 2 weeks. Had 2 additional seizures in ED. Intubated for airway protection. Found to have ICH - SAH, SDH. Became bradycardic and was subsequently started on hypertonic saline 3% and Cleviprex. ? ?Transferred from [facility] ED to [facility] ED for higher level of care/NSGY intervention. Underwent craniotomy and bone flap with NSGY (Dr. Wynetta Emery) 4/29. PCCM consulted for assistance with MV/medical management. Extubated 5/2. ? ?Pertinent Medical History:  ?Angina  ? ?Significant Hospital Events: ?Including procedures, antibiotic start and stop dates in addition to other pertinent events   ?4/29  multiple seizures. Hypoglycemia (factitious). Intubated. Found to have SAH, SDH. 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  Dominant left posterior communicating artery patent ?4/29 Left-sided pterional craniotomy for evacuation of left-sided subdural hematoma with implantation of the bone flap in the left abdominal wall ?4/30 considerable improvement postintervention with near complete resolution of midline shift. ?5/1 weaned 11hrs but still too sleepy for extubation, Intracranial drain removed by NS this morning. Tmax 101 ?5/2 aline  removed/ not working, more awake/extubated  ?5/4 working with PT  ?5/5 working with SLP.  Mild vasospasm on TCD ?5/6 adding immodium  ?5/7 diarrhea improved, no overnight events ?5/8 Ongoing HA/head pain. No significant events overnight. C/o seeing "red flowers" in L eye. ?5/9 Improved mobility, less head pain today. Up with assistance, tolerating well. ?5/10 increased agitation today, up getting out of bed and fall risk ?5/12, stable no acute events ? ?Interim History / Subjective:  ?No acute events other than confusion and trying to get out of bed at night requiring posey belt and wrist restraints ? ? ?Objective:  ?Blood pressure (!) 125/96, pulse 84, temperature 97.8 ?F (36.6 ?C), temperature source Oral, resp. rate 20, height 5\' 6"  (1.676 m), weight 79 kg, SpO2 99 %. ?   ?   ? ?Intake/Output Summary (Last 24 hours) at 09/10/2021 1432 ?Last data filed at 09/10/2021 0800 ?Gross per 24 hour  ?Intake 120 ml  ?Output --  ?Net 120 ml  ? ?Filed Weights  ? 09/06/21 0500 09/08/21 0500 09/09/21 0500  ?Weight: 83.4 kg 83.8 kg 79 kg  ? ? ? ?General:  chronically ill-appearing F, awake, confused, no acute distress ?HEENT: MM pink/moist, NGT in place ?Neuro: awake, confused, speech clear, wants to leave the hospital, follows commands ?CV: s1s2 rrr, no m/r/g ?PULM:  clear bilaterally on RA in no distress ?GI: soft, bsx4 active, soft, bone flap in place ?Extremities: warm/dry, no edema  ?Skin: no rashes or lesions ? ? ?Glu 115 ? ? ?Resolved Hospital Problem List:  ?Acute Respiratory Failure with hypoxia likely secondary to aspiration PNA-completed course of Cefepime and now on RA ? 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 ?Dysphagia ?Pt with minimal appetite prior to arrival per family ?Continue dysphagia diet ?Continue tube feeds ?Dietitian is following ? ? ?Best Practice: (right click and "Reselect all SmartList Selections" daily)  ? ?Diet/type: tubefeeds and Regular consistency (see orders) ?DVT prophylaxis: prophylactic heparin   ?GI prophylaxis: PPI ?Lines: N/A ?Foley:  N/A ?Code Status:  full code ?Last date of multidisciplinary goals of care discussion-- Patient and family updated at bedside 5/9, per primary ? ?Critical care time: N/A  ? ?  ?Cheri Fowler MD ?[facility] ?See Amion for pager ?If no response to pager, please call [phone] until 7pm ?After 7pm, Please call E-link [phone] ? ? ? ? ?

## 2021-09-11 LAB — GLUCOSE, CAPILLARY
Glucose-Capillary: 110 mg/dL — ABNORMAL HIGH (ref 70–99)
Glucose-Capillary: 116 mg/dL — ABNORMAL HIGH (ref 70–99)
Glucose-Capillary: 124 mg/dL — ABNORMAL HIGH (ref 70–99)
Glucose-Capillary: 129 mg/dL — ABNORMAL HIGH (ref 70–99)
Glucose-Capillary: 82 mg/dL (ref 70–99)
Glucose-Capillary: 98 mg/dL (ref 70–99)

## 2021-09-11 MED ORDER — MELATONIN 5 MG PO TABS
10.0000 mg | ORAL_TABLET | Freq: Every day | ORAL | Status: DC
  Administered 2021-09-11: 10 mg via ORAL
  Filled 2021-09-11: qty 2

## 2021-09-11 NOTE — Progress Notes (Signed)
? ?NAME:  Kayla Maynard, MRN:  [id number], DOB:  June 03, 1955, LOS: 15 ?ADMISSION DATE:  08/27/2021, CONSULTATION DATE:  08/27/2021 ?REFERRING MD:  Wynetta Emery - NSGY, CHIEF COMPLAINT:  SAH, endotracheally intubated  ? ?History of Present Illness:  ?66 year old woman who presented to [facility] ED 4/29 after loss of consciousness at home. Family actually started CPR per 911 advice, though unclear if pulses were actually lost. Description of hypertonicity and myoclonus more concerning for seizure. On EMS arrival patient awake but hypoglycemic, received D10. PMHx angina. ? ?In ED, patient denied history of seizures. Endorsed HA and neck pain, worse x 2 weeks. Had 2 additional seizures in ED. Intubated for airway protection. Found to have ICH - SAH, SDH. Became bradycardic and was subsequently started on hypertonic saline 3% and Cleviprex. ? ?Transferred from [facility] ED to [facility] ED for higher level of care/NSGY intervention. Underwent craniotomy and bone flap with NSGY (Dr. Wynetta Emery) 4/29. PCCM consulted for assistance with MV/medical management. Extubated 5/2. ? ?Pertinent Medical History:  ?Angina  ? ?Significant Hospital Events: ?Including procedures, antibiotic start and stop dates in addition to other pertinent events   ?4/29  multiple seizures. Hypoglycemia (factitious). Intubated. Found to have SAH, SDH. 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  Dominant left posterior communicating artery patent ?4/29 Left-sided pterional craniotomy for evacuation of left-sided subdural hematoma with implantation of the bone flap in the left abdominal wall ?4/30 considerable improvement postintervention with near complete resolution of midline shift. ?5/1 weaned 11hrs but still too sleepy for extubation, Intracranial drain removed by NS this morning. Tmax 101 ?5/2 aline  removed/ not working, more awake/extubated  ?5/4 working with PT  ?5/5 working with SLP.  Mild vasospasm on TCD ?5/6 adding immodium  ?5/7 diarrhea improved, no overnight events ?5/8 Ongoing HA/head pain. No significant events overnight. C/o seeing "red flowers" in L eye. ?5/9 Improved mobility, less head pain today. Up with assistance, tolerating well. ?5/10 increased agitation today, up getting out of bed and fall risk ?5/12, stable no acute events ? ?Interim History / Subjective:  ?No acute events other than confusion and trying to get out of bed  ?Patient did not sleep for last 2 days, now hallucinating ? ?Objective:  ?Blood pressure 131/89, pulse 94, temperature 97.8 ?F (36.6 ?C), temperature source Axillary, resp. rate 18, height 5\' 6"  (1.676 m), weight 79 kg, SpO2 (!) 88 %. ?   ?   ?No intake or output data in the 24 hours ending 09/11/21 1103 ? ?Filed Weights  ? 09/06/21 0500 09/08/21 0500 09/09/21 0500  ?Weight: 83.4 kg 83.8 kg 79 kg  ? ? ?General:  Chronically ill-appearing F, awake, confused, not in acute distress ?HEENT: MM pink/moist, NGT in place ?Neuro: awake, confused, disoriented, speech clear, wants to leave the hospital, follows commands ?CV: s1s2 rrr, no m/r/g ?PULM:  clear bilaterally on RA in no distress ?GI: soft, bsx4 active, soft, bone flap in place ?Extremities: warm/dry, no edema  ?Skin: no rashes or lesions ? ? ?Resolved Hospital Problem List:  ?Acute Respiratory Failure with hypoxia likely secondary to aspiration PNA-completed course of Cefepime and now on RA ? 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 ?Dysphagia ?Pt with minimal appetite prior to arrival per family ?Continue dysphagia diet ?Continue tube feeds ?Dietitian is following ? ? ?Best Practice: (right click and "Reselect all SmartList Selections" daily)  ? ?Diet/type: tubefeeds and Regular consistency (see orders) ?DVT prophylaxis: prophylactic heparin   ?GI prophylaxis: PPI ?Lines: N/A ?Foley:  N/A ?Code Status:  full code ?Last date of multidisciplinary goals of care discussion: Per primary team ? ?Critical care time: N/A  ? ?  ?Cheri Fowler MD ?[facility] ?See Amion for pager ?If no response to pager, please call [phone] until 7pm ?After 7pm, Please call E-link [phone] ? ? ?

## 2021-09-11 NOTE — Progress Notes (Addendum)
She appears to be a little more agitated and little disoriented today.  She is out of bed to a chair.  She is moving all extremities.  She is awake and alert and interactive.  Her incision is clean dry and intact.  Nurse states she has not slept for 2 days.  It is okay from our standpoint to give a sleep aid.  Continue current management. ?

## 2021-09-12 ENCOUNTER — Other Ambulatory Visit (HOSPITAL_COMMUNITY): Payer: Worker's Compensation

## 2021-09-12 ENCOUNTER — Encounter (HOSPITAL_COMMUNITY): Payer: Self-pay | Admitting: Physical Medicine & Rehabilitation

## 2021-09-12 ENCOUNTER — Other Ambulatory Visit: Payer: Self-pay

## 2021-09-12 ENCOUNTER — Inpatient Hospital Stay (HOSPITAL_COMMUNITY)
Admission: RE | Admit: 2021-09-12 | Discharge: 2021-09-28 | DRG: 066 | Disposition: A | Discharge status: Home / Self Care | Payer: Medicare Other | Source: Intra-hospital | Attending: Physical Medicine & Rehabilitation | Admitting: Physical Medicine & Rehabilitation

## 2021-09-12 DIAGNOSIS — I611 Nontraumatic intracerebral hemorrhage in hemisphere, cortical: Secondary | ICD-10-CM | POA: Diagnosis not present

## 2021-09-12 DIAGNOSIS — R1312 Dysphagia, oropharyngeal phase: Secondary | ICD-10-CM

## 2021-09-12 DIAGNOSIS — Z781 Physical restraint status: Secondary | ICD-10-CM | POA: Diagnosis not present

## 2021-09-12 DIAGNOSIS — E875 Hyperkalemia: Secondary | ICD-10-CM | POA: Diagnosis present

## 2021-09-12 DIAGNOSIS — R630 Anorexia: Secondary | ICD-10-CM | POA: Diagnosis present

## 2021-09-12 DIAGNOSIS — G4700 Insomnia, unspecified: Secondary | ICD-10-CM | POA: Diagnosis present

## 2021-09-12 DIAGNOSIS — L270 Generalized skin eruption due to drugs and medicaments taken internally: Secondary | ICD-10-CM | POA: Diagnosis not present

## 2021-09-12 DIAGNOSIS — R32 Unspecified urinary incontinence: Secondary | ICD-10-CM | POA: Diagnosis present

## 2021-09-12 DIAGNOSIS — G40909 Epilepsy, unspecified, not intractable, without status epilepticus: Secondary | ICD-10-CM | POA: Diagnosis not present

## 2021-09-12 DIAGNOSIS — I610 Nontraumatic intracerebral hemorrhage in hemisphere, subcortical: Secondary | ICD-10-CM | POA: Diagnosis not present

## 2021-09-12 DIAGNOSIS — Z6827 Body mass index (BMI) 27.0-27.9, adult: Secondary | ICD-10-CM

## 2021-09-12 DIAGNOSIS — R7989 Other specified abnormal findings of blood chemistry: Secondary | ICD-10-CM | POA: Diagnosis not present

## 2021-09-12 DIAGNOSIS — Z79899 Other long term (current) drug therapy: Secondary | ICD-10-CM | POA: Diagnosis not present

## 2021-09-12 DIAGNOSIS — R451 Restlessness and agitation: Secondary | ICD-10-CM | POA: Diagnosis not present

## 2021-09-12 DIAGNOSIS — G479 Sleep disorder, unspecified: Secondary | ICD-10-CM

## 2021-09-12 DIAGNOSIS — I609 Nontraumatic subarachnoid hemorrhage, unspecified: Principal | ICD-10-CM | POA: Diagnosis present

## 2021-09-12 DIAGNOSIS — F5101 Primary insomnia: Secondary | ICD-10-CM | POA: Diagnosis not present

## 2021-09-12 DIAGNOSIS — R5381 Other malaise: Secondary | ICD-10-CM | POA: Diagnosis present

## 2021-09-12 DIAGNOSIS — R569 Unspecified convulsions: Secondary | ICD-10-CM | POA: Diagnosis present

## 2021-09-12 DIAGNOSIS — I619 Nontraumatic intracerebral hemorrhage, unspecified: Secondary | ICD-10-CM | POA: Diagnosis present

## 2021-09-12 DIAGNOSIS — G441 Vascular headache, not elsewhere classified: Secondary | ICD-10-CM | POA: Diagnosis not present

## 2021-09-12 LAB — GLUCOSE, CAPILLARY
Glucose-Capillary: 137 mg/dL — ABNORMAL HIGH (ref 70–99)
Glucose-Capillary: 140 mg/dL — ABNORMAL HIGH (ref 70–99)
Glucose-Capillary: 131 mg/dL — ABNORMAL HIGH (ref 70–99)
Glucose-Capillary: 151 mg/dL — ABNORMAL HIGH (ref 70–99)
Glucose-Capillary: 120 mg/dL — ABNORMAL HIGH (ref 70–99)

## 2021-09-12 LAB — CBC WITH DIFFERENTIAL/PLATELET
Abs Immature Granulocytes: 0.26 10*3/uL — ABNORMAL HIGH (ref 0.00–0.07)
Basophils Absolute: 0.1 10*3/uL (ref 0.0–0.1)
Basophils Relative: 1 %
Eosinophils Absolute: 0.3 10*3/uL (ref 0.0–0.5)
Eosinophils Relative: 2 %
HCT: 41.3 % (ref 36.0–46.0)
Hemoglobin: 13.4 g/dL (ref 12.0–15.0)
Immature Granulocytes: 2 %
Lymphocytes Relative: 17 %
Lymphs Abs: 2.3 10*3/uL (ref 0.7–4.0)
MCH: 32 pg (ref 26.0–34.0)
MCHC: 32.4 g/dL (ref 30.0–36.0)
MCV: 98.6 fL (ref 80.0–100.0)
Monocytes Absolute: 0.9 10*3/uL (ref 0.1–1.0)
Monocytes Relative: 7 %
Neutro Abs: 9.3 10*3/uL — ABNORMAL HIGH (ref 1.7–7.7)
Neutrophils Relative %: 71 %
Platelets: 455 10*3/uL — ABNORMAL HIGH (ref 150–400)
RBC: 4.19 MIL/uL (ref 3.87–5.11)
RDW: 12.6 % (ref 11.5–15.5)
WBC: 13.2 10*3/uL — ABNORMAL HIGH (ref 4.0–10.5)
nRBC: 0 % (ref 0.0–0.2)

## 2021-09-12 LAB — PHOSPHORUS: Phosphorus: 6.8 mg/dL — ABNORMAL HIGH (ref 2.5–4.6)

## 2021-09-12 LAB — BASIC METABOLIC PANEL
Anion gap: 12 (ref 5–15)
BUN: 61 mg/dL — ABNORMAL HIGH (ref 8–23)
CO2: 16 mmol/L — ABNORMAL LOW (ref 22–32)
Calcium: 9.7 mg/dL (ref 8.9–10.3)
Chloride: 107 mmol/L (ref 98–111)
Creatinine, Ser: 1.24 mg/dL — ABNORMAL HIGH (ref 0.44–1.00)
GFR, Estimated: 49 mL/min — ABNORMAL LOW (ref >60)
Glucose, Bld: 124 mg/dL — ABNORMAL HIGH (ref 70–99)
Potassium: 4.5 mmol/L (ref 3.5–5.1)
Sodium: 135 mmol/L (ref 135–145)

## 2021-09-12 LAB — MAGNESIUM: Magnesium: 2.6 mg/dL — ABNORMAL HIGH (ref 1.7–2.4)

## 2021-09-12 MED ORDER — ALUM & MAG HYDROXIDE-SIMETH 200-200-20 MG/5ML PO SUSP: 30.0000 mL | ORAL | Status: DC | PRN

## 2021-09-12 MED ORDER — NIMODIPINE 6 MG/ML PO SOLN
60.0000 mg | ORAL | Status: AC
  Administered 2021-09-12 – 2021-09-18 (×31): 60 mg
  Filled 2021-09-12 (×33): qty 10

## 2021-09-12 MED ORDER — OSMOLITE 1.5 CAL PO LIQD: 996.0000 mL | ORAL | 0 refills | Status: DC

## 2021-09-12 MED ORDER — LEVETIRACETAM 100 MG/ML PO SOLN
1000.0000 mg | Freq: Two times a day (BID) | ORAL | Status: DC
  Administered 2021-09-12 – 2021-09-20 (×16): 1000 mg via ORAL
  Filled 2021-09-12 (×16): qty 10

## 2021-09-12 MED ORDER — MELATONIN 5 MG PO TABS
10.0000 mg | ORAL_TABLET | Freq: Every day | ORAL | Status: DC
  Administered 2021-09-12 – 2021-09-27 (×16): 10 mg via ORAL
  Filled 2021-09-12 (×16): qty 2

## 2021-09-12 MED ORDER — NUTRISOURCE FIBER PO PACK: 1.0000 | PACK | Freq: Two times a day (BID) | ORAL | Status: DC

## 2021-09-12 MED ORDER — DIPHENHYDRAMINE HCL 12.5 MG/5ML PO ELIX
12.5000 mg | ORAL_SOLUTION | Freq: Four times a day (QID) | ORAL | Status: DC | PRN
  Administered 2021-09-25: 25 mg via ORAL
  Filled 2021-09-12: qty 10

## 2021-09-12 MED ORDER — SORBITOL 70 % SOLN
30.0000 mL | Freq: Every day | Status: DC | PRN
  Administered 2021-09-24: 30 mL via ORAL
  Filled 2021-09-12: qty 30

## 2021-09-12 MED ORDER — OSMOLITE 1.5 CAL PO LIQD
996.0000 mL | ORAL | Status: DC
  Administered 2021-09-12 – 2021-09-14 (×3): 1000 mL
  Filled 2021-09-12 (×3): qty 1000

## 2021-09-12 MED ORDER — MELATONIN 10 MG PO TABS: 10.0000 mg | ORAL_TABLET | Freq: Every day | ORAL | 0 refills | Status: DC

## 2021-09-12 MED ORDER — LOPERAMIDE HCL 1 MG/7.5ML PO SUSP
2.0000 mg | ORAL | Status: DC | PRN
  Administered 2021-09-15 – 2021-09-17 (×2): 2 mg via ORAL
  Filled 2021-09-12 (×4): qty 15

## 2021-09-12 MED ORDER — SENNOSIDES-DOCUSATE SODIUM 8.6-50 MG PO TABS: 1.0000 | ORAL_TABLET | Freq: Every evening | ORAL | Status: DC | PRN

## 2021-09-12 MED ORDER — PROCHLORPERAZINE EDISYLATE 10 MG/2ML IJ SOLN: 5.0000 mg | Freq: Four times a day (QID) | INTRAMUSCULAR | Status: DC | PRN

## 2021-09-12 MED ORDER — TRAZODONE HCL 50 MG PO TABS
25.0000 mg | ORAL_TABLET | Freq: Every evening | ORAL | Status: DC | PRN
  Administered 2021-09-15 – 2021-09-16 (×2): 50 mg via ORAL
  Filled 2021-09-12 (×2): qty 1

## 2021-09-12 MED ORDER — LOPERAMIDE HCL 1 MG/7.5ML PO SUSP: 2.0000 mg | ORAL | 0 refills | Status: DC | PRN

## 2021-09-12 MED ORDER — LISINOPRIL 20 MG PO TABS
20.0000 mg | ORAL_TABLET | Freq: Every day | ORAL | Status: DC
  Administered 2021-09-13 – 2021-09-15 (×3): 20 mg via ORAL
  Filled 2021-09-12 (×3): qty 1

## 2021-09-12 MED ORDER — NIMODIPINE 30 MG PO CAPS
60.0000 mg | ORAL_CAPSULE | ORAL | Status: DC
  Administered 2021-09-15 (×2): 60 mg via ORAL
  Filled 2021-09-12 (×6): qty 2

## 2021-09-12 MED ORDER — PROCHLORPERAZINE MALEATE 5 MG PO TABS
5.0000 mg | ORAL_TABLET | Freq: Four times a day (QID) | ORAL | Status: DC | PRN
  Administered 2021-09-18: 10 mg via ORAL
  Filled 2021-09-12: qty 2

## 2021-09-12 MED ORDER — GUAIFENESIN-DM 100-10 MG/5ML PO SYRP: 5.0000 mL | ORAL_SOLUTION | Freq: Four times a day (QID) | ORAL | Status: DC | PRN

## 2021-09-12 MED ORDER — NIMODIPINE 6 MG/ML PO SOLN: 60.0000 mg | ORAL | Status: DC

## 2021-09-12 MED ORDER — CHLORHEXIDINE GLUCONATE 0.12 % MT SOLN: 15.0000 mL | Freq: Two times a day (BID) | OROMUCOSAL | 0 refills | Status: DC

## 2021-09-12 MED ORDER — PROSOURCE TF PO LIQD
45.0000 mL | Freq: Three times a day (TID) | ORAL | Status: DC
  Administered 2021-09-12 – 2021-09-20 (×24): 45 mL
  Filled 2021-09-12 (×25): qty 45

## 2021-09-12 MED ORDER — LEVETIRACETAM 100 MG/ML PO SOLN: 1000.0000 mg | Freq: Two times a day (BID) | ORAL | 12 refills | Status: DC

## 2021-09-12 MED ORDER — LISINOPRIL 20 MG PO TABS: 20.0000 mg | ORAL_TABLET | Freq: Every day | ORAL | Status: DC

## 2021-09-12 MED ORDER — ACETAMINOPHEN 325 MG PO TABS
325.0000 mg | ORAL_TABLET | ORAL | Status: DC | PRN
  Administered 2021-09-13 – 2021-09-17 (×5): 650 mg via ORAL
  Administered 2021-09-22: 325 mg via ORAL
  Filled 2021-09-12 (×6): qty 2
  Filled 2021-09-12: qty 1

## 2021-09-12 MED ORDER — GERHARDT'S BUTT CREAM: TOPICAL_CREAM | CUTANEOUS | Status: DC | PRN

## 2021-09-12 MED ORDER — PROCHLORPERAZINE 25 MG RE SUPP: 12.5000 mg | Freq: Four times a day (QID) | RECTAL | Status: DC | PRN

## 2021-09-12 MED ORDER — QUETIAPINE FUMARATE 25 MG PO TABS
25.0000 mg | ORAL_TABLET | Freq: Every day | ORAL | Status: DC
  Administered 2021-09-12 – 2021-09-27 (×16): 25 mg via ORAL
  Filled 2021-09-12 (×15): qty 1

## 2021-09-12 MED ORDER — ENSURE ENLIVE PO LIQD
237.0000 mL | Freq: Two times a day (BID) | ORAL | Status: DC
  Administered 2021-09-13 – 2021-09-16 (×5): 237 mL

## 2021-09-12 MED ORDER — INSULIN ASPART 100 UNIT/ML IJ SOLN
2.0000 [IU] | INTRAMUSCULAR | Status: DC
  Administered 2021-09-12: 4 [IU] via SUBCUTANEOUS
  Administered 2021-09-13 – 2021-09-19 (×14): 2 [IU] via SUBCUTANEOUS

## 2021-09-12 MED ORDER — HEPARIN SODIUM (PORCINE) 5000 UNIT/ML IJ SOLN
5000.0000 [IU] | Freq: Three times a day (TID) | INTRAMUSCULAR | Status: DC
  Administered 2021-09-12 – 2021-09-15 (×8): 5000 [IU] via SUBCUTANEOUS
  Filled 2021-09-12 (×8): qty 1

## 2021-09-12 MED ORDER — HYDRALAZINE HCL 10 MG PO TABS: 10.0000 mg | ORAL_TABLET | Freq: Four times a day (QID) | ORAL | Status: DC | PRN

## 2021-09-12 MED ORDER — ENSURE ENLIVE PO LIQD: 237.0000 mL | Freq: Two times a day (BID) | ORAL | 12 refills | Status: DC

## 2021-09-12 MED ORDER — QUETIAPINE FUMARATE 25 MG PO TABS
25.0000 mg | ORAL_TABLET | Freq: Every evening | ORAL | Status: DC | PRN
Start: 2021-09-12 — End: 2021-09-28
  Administered 2021-09-16 (×2): 25 mg via ORAL
  Filled 2021-09-12 (×3): qty 1

## 2021-09-12 MED ORDER — METHOCARBAMOL 500 MG PO TABS
500.0000 mg | ORAL_TABLET | Freq: Four times a day (QID) | ORAL | Status: DC | PRN
  Administered 2021-09-16: 500 mg via ORAL
  Filled 2021-09-12 (×2): qty 1

## 2021-09-12 MED ORDER — CHLORHEXIDINE GLUCONATE 0.12 % MT SOLN
15.0000 mL | Freq: Two times a day (BID) | OROMUCOSAL | Status: DC
  Administered 2021-09-12 – 2021-09-16 (×6): 15 mL via OROMUCOSAL
  Filled 2021-09-12 (×8): qty 15

## 2021-09-12 MED ORDER — ORAL CARE MOUTH RINSE
15.0000 mL | Freq: Two times a day (BID) | OROMUCOSAL | Status: DC
  Administered 2021-09-12 – 2021-09-27 (×19): 15 mL via OROMUCOSAL

## 2021-09-12 MED ORDER — PROSOURCE TF PO LIQD: 45.0000 mL | Freq: Three times a day (TID) | ORAL | Status: DC

## 2021-09-12 MED ORDER — HYDROCODONE-ACETAMINOPHEN 10-325 MG PO TABS
1.0000 | ORAL_TABLET | ORAL | Status: DC | PRN
Start: 2021-09-12 — End: 2021-09-28
  Administered 2021-09-14 – 2021-09-16 (×3): 1 via ORAL
  Filled 2021-09-12 (×5): qty 1

## 2021-09-12 MED ORDER — NUTRISOURCE FIBER PO PACK
1.0000 | PACK | Freq: Two times a day (BID) | ORAL | Status: DC
  Administered 2021-09-12 – 2021-09-23 (×22): 1
  Filled 2021-09-12 (×22): qty 1

## 2021-09-12 MED ORDER — GERHARDT'S BUTT CREAM: 1.0000 "application " | TOPICAL_CREAM | CUTANEOUS | Status: DC | PRN

## 2021-09-12 MED ORDER — FLEET ENEMA 7-19 GM/118ML RE ENEM: 1.0000 | ENEMA | Freq: Once | RECTAL | Status: DC | PRN

## 2021-09-12 NOTE — Progress Notes (Signed)
Inpatient Rehabilitation Admission Medication Review by a Pharmacist ? ?A complete drug regimen review was completed for this patient to identify any potential clinically significant medication issues. ? ?High Risk Drug Classes Is patient taking? Indication by Medication  ?Antipsychotic Yes Prochlorperazine: PRN nausea/vomiting ?Quetiapine: PRN agitation, insomnia  ?Anticoagulant Yes SQ heparin: VTE prophylaxis  ?Antibiotic No   ?Opioid Yes Norco 10-325: PRN pain  ?Antiplatelet No   ?Hypoglycemics/insulin Yes Insulin aspart: hyperglycemia  ?Vasoactive Medication Yes Lisinopril: hypertension ?Nimodipine: hypertension  ?Chemotherapy No   ?Other Yes Levetiracetam: seizure prophylaxis ?Loperamide: PRN diarrhea ?Melatonin: insomnia/sleep ?Methocarbamol: PRN muscle spasms ?Senokot-S, Fleet Enema: PRN constipation ?Trazodone: PRN sleep  ? ? ? ?Type of Medication Issue Identified Description of Issue Recommendation(s)  ?Drug Interaction(s) (clinically significant) ?    ?Duplicate Therapy ?    ?Allergy ?    ?No Medication Administration End Date ?    ?Incorrect Dose ?    ?Additional Drug Therapy Needed ?    ?Significant med changes from prior encounter (inform family/care partners about these prior to discharge).    ?Other ?    ? ? ?Clinically significant medication issues were identified that warrant physician communication and completion of prescribed/recommended actions by midnight of the next day:  No ? ? ?Pharmacist comments: n/a ? ? ?Time spent performing this drug regimen review (minutes): 20 ? ? ?Thank you for allowing pharmacy to be a part of this patient?s care. ? ?Thelma Barge, PharmD ?Clinical Pharmacist ? ?

## 2021-09-12 NOTE — H&P (Signed)
? ? ?Physical Medicine and Rehabilitation Admission H&P ? ?  ?CC: Deficits secondary to intracranial hemorrhage-subarachnoid hemorrhage, subdural hematoma ? ?HPI: Kayla Maynard is a 66 year old female who was in her usual state of health on 08/27/2020 when she was home alone, felt dizzy and lightheaded associated with nausea and 1 episode of emesis.  The patient called her sister and upon her arrival, found the patient unresponsive.  She dialed 911 and adives to begin CPR.  Family member described seizure type activity.  EMS was called and she was transported to [facility] department.  She was found to be hypoglycemic and given D10.  The patient has no history of diabetes mellitus.  On arrival, the patient was able to recount what had occurred and endorsed neck pain and headache for 2 weeks.  Seizure activity was witnessed in the emergency department.  CT scan of the head revealed extensive subarachnoid hemorrhage at the basilar cisterns, interhemispheric fissure, bilateral sylvian fissures greater on the left.  A large subdural hematoma of the left frontal, parietal and temporal regions also noted.  Marked mass effect upon the left hemisphere.  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  The [facility] and critical care medicine and neurosurgery consulted.  She required intubation prior to transfer.  3% of IV saline and clevidipine initiated.  Neurology also consulted.  Nimodipine started 4/29 to prevent vasospasm.  Dr. Wynetta Emeryram recommended angiography with interventional radiology and possible embolization prior to craniotomy for evacuation of the subdural.  On 4/29, she underwent arteriogram by Dr. 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  Afterward, she was taken to the operating room where she underwent left-sided pterional craniotomy for evacuation of left-sided subdural hematoma with implantation of the bone  flap in the left abdominal wall. Intracranial drain removed 5/1. Continued on Keppra, Tube feeds started via Cortrak. Cleared for pharmacologic DVT prophylaxis.  Extubated on 5/2. Mild agitation requiring restraints. Hemdynamically stable. The patient requires inpatient medicine and rehabilitation evaluations and services for ongoing dysfunction secondary to nontraumatic subarachnoid bleed from left posterior communicating artery, subdural hematoma, status post craniectomy. ? ?Review of Systems  ?Unable to perform ROS: Mental acuity  ?Past Medical History:  ?Diagnosis Date  ? Angina at rest [facility](HCC)   ? ?Past Surgical History:  ?Procedure Laterality Date  ? CRANIOTOMY Left 08/27/2021  ? Procedure: CRANIOTOMY FOR EVACUATION OF SUBDURAL HEMATOMA WITH PLACEMENT OF BONE FLAP IN ABDOMEN;  Surgeon: Donalee Citrinram, Gary, MD;  Location: [facility] OR;  Service: Neurosurgery;  Laterality: Left;  ? IR ANGIO INTRA EXTRACRAN SEL COM CAROTID INNOMINATE UNI R MOD SED  08/27/2021  ? IR ANGIO INTRA EXTRACRAN SEL INTERNAL CAROTID UNI L MOD SED  08/30/2021  ? IR ANGIO VERTEBRAL SEL SUBCLAVIAN INNOMINATE UNI R MOD SED  08/30/2021  ? IR ANGIOGRAM FOLLOW UP STUDY  08/27/2021  ? IR ANGIOGRAM FOLLOW UP STUDY  08/27/2021  ? IR ANGIOGRAM FOLLOW UP STUDY  08/27/2021  ? IR ANGIOGRAM FOLLOW UP STUDY  08/27/2021  ? IR ANGIOGRAM FOLLOW UP STUDY  08/27/2021  ? IR ANGIOGRAM FOLLOW UP STUDY  08/27/2021  ? IR ANGIOGRAM FOLLOW UP STUDY  08/27/2021  ? IR CT HEAD LTD  08/27/2021  ? IR NEURO EACH ADD'L AFTER BASIC UNI LEFT (MS)  08/30/2021  ? IR TRANSCATH/EMBOLIZ  08/27/2021  ? RADIOLOGY WITH ANESTHESIA N/A 08/27/2021  ? Procedure: RADIOLOGY WITH ANESTHESIA;  Surgeon: Julieanne Cottoneveshwar, Sanjeev, MD;  Location: [facility] OR;  Service: Radiology;  Laterality:  N/A;  ? STENT PLACEMENT VASCULAR ([facility] HX)    ? ?History reviewed. No pertinent family history. ?Social History:  reports that she has been smoking cigarettes. She has been smoking an average of 1 pack per day. She has never used smokeless tobacco. She  reports current alcohol use. She reports that she does not use drugs. ?Allergies:  ?Allergies  ?Allergen Reactions  ? Morphine   ? Penicillins Hives  ? ?Medications Prior to Admission  ?Medication Sig Dispense Refill  ? Magnesium 200 MG TABS Take 1 tablet by mouth daily.    ? nitroGLYCERIN (NITROSTAT) 0.4 MG SL tablet Place 0.4 mg under the tongue every 5 (five) minutes as needed for chest pain.    ? OVER THE COUNTER MEDICATION Take 1 tablet by mouth daily. Super Beets    ? zinc gluconate 50 MG tablet Take 50 mg by mouth daily.    ? ? ? ? ?Home: ?Home Living ?Family/patient expects to be discharged to:: Private residence ?Living Arrangements: Alone ?Available Help at Discharge: Family, Available 24 hours/day ?Type of Home: House ?Home Access: Stairs to enter ?Entrance Stairs-Number of Steps: 4 ?Entrance Stairs-Rails: None ?Home Layout: Two level ?Alternate Level Stairs-Number of Steps: 3 ?Alternate Level Stairs-Rails: Right ?Bathroom Shower/Tub: Tub/shower unit (walk in tub) ?Bathroom Toilet: Standard ?Bathroom Accessibility: Yes ?Home Equipment: Grab bars - tub/shower, Hand held shower head ?Additional Comments: good family support, on disability for back pain ? Lives With: Alone ?  ?Functional History: ?Prior Function ?Prior Level of Function : Independent/Modified Independent, Driving ?Mobility Comments: independent ? ?Functional Status:  ?Mobility: ?Bed Mobility ?Overal bed mobility: Needs Assistance ?Bed Mobility: Sit to Supine ?Rolling: Min assist ?Sidelying to sit: Min guard, HOB elevated ?Supine to sit: Min assist, HOB elevated ?Sit to supine: Min assist ?General bed mobility comments: min A for trunk elevation, Pt reaching out for assist ?Transfers ?Overall transfer level: Needs assistance ?Equipment used: Rolling walker (2 wheels), None ?Transfers: Sit to/from Stand ?Sit to Stand: Min guard ?Bed to/from chair/wheelchair/BSC transfer type:: Step pivot ?Step pivot transfers: Min guard ?General transfer  comment: Pt did well waiting for therapy while OT got lines together and worked on restraints, better impulse control for mobility today from previous session ?Ambulation/Gait ?Ambulation/Gait assistance: Min guard ?Gait Distance (Feet): 250 Feet ?Assistive device: Rolling walker (2 wheels) ?Gait Pattern/deviations: Step-through pattern ?General Gait Details: pt bumping into many objects in hallway with poor awareness. Pt is easily distracted, looking into every room on unit she passes when ambulating. Pt bumps into at least 5 chairs, nearly one person (person moved), and one cart. Pt ramming walker into 2 chairs in an effort to mobilize through narrow space, PT provides verbal cues to turn around. Pt often lifting walker over ojects at times, but declines ambulating without device ?Gait velocity: reduced ?Gait velocity interpretation: <1.31 ft/sec, indicative of household ambulator ?  ? ?ADL: ?ADL ?Overall ADL's : Needs assistance/impaired ?Eating/Feeding: NPO ?Grooming: Oral care, Minimal assistance, Standing, Wash/dry face, Wash/dry hands ?Grooming Details (indicate cue type and reason): standing at sink in bathroom, assist to manage lines and problem solve working around feeding tube ?Upper Body Bathing: Minimal assistance, Standing ?Upper Body Bathing Details (indicate cue type and reason): for back in standing at sink ?Lower Body Bathing: Maximal assistance, +2 for physical assistance, +2 for safety/equipment, Sit to/from stand ?Upper Body Dressing : Moderate assistance, Sitting ?Upper Body Dressing Details (indicate cue type and reason): don gown like bathobe ?Lower Body Dressing: Minimal assistance, Sitting/lateral leans ?Lower Body Dressing  Details (indicate cue type and reason): adjusting sock sitting EOB ?Toilet Transfer: Rolling walker (2 wheels), Ambulation, Regular Toilet, Minimal assistance ?Toilet Transfer Details (indicate cue type and reason): assist for cues, and to manage RW ?Toileting- Designer, fashion/clothing and Hygiene: Min guard, Sitting/lateral lean ?Toileting - Clothing Manipulation Details (indicate cue type and reason): provided with warm wash cloths and patient appreciated it ?Functional m

## 2021-09-12 NOTE — TOC Transition Note (Signed)
Transition of Care (TOC) - CM/SW Discharge Note ? ? ?Patient Details  ?Name: Kayla Maynard ?MRN: [id number] ?Date of Birth: 04-06-1956 ? ?Transition of Care (TOC) CM/SW Contact:  ?Kermit Balo, RN ?Phone Number: ?09/12/2021, 10:40 AM ? ? ?Clinical Narrative:    ?Patient is discharging to CIR today. CM signing off.  ? ? ?Final next level of care: IP Rehab Facility ?Barriers to Discharge: No Barriers Identified ? ? ?Patient Goals and CMS Choice ?  ?  ?  ? ?Discharge Placement ?  ?           ?  ?  ?  ?  ? ?Discharge Plan and Services ?  ?  ?           ?  ?  ?  ?  ?  ?  ?  ?  ?  ?  ? ?Social Determinants of Health (SDOH) Interventions ?  ? ? ?Readmission Risk Interventions ?   ? View : No data to display.  ?  ?  ?  ? ? ? ? ? ?

## 2021-09-12 NOTE — H&P (Signed)
?  ?Physical Medicine and Rehabilitation Admission H&P ?  ?  ?CC: Deficits secondary to intracranial hemorrhage-subarachnoid hemorrhage, subdural hematoma ?  ?HPI: Kayla Maynard is a 66 year old female who was in her usual state of health on 08/27/2020 when she was home alone, felt dizzy and lightheaded associated with nausea and 1 episode of emesis.  The patient called her sister and upon her arrival, found the patient unresponsive.  She dialed 911 and adives to begin CPR.  Family member described seizure type activity.  EMS was called and she was transported to [facility] emergency department.  She was found to be hypoglycemic and given D10.  The patient has no history of diabetes mellitus.  On arrival, the patient was able to recount what had occurred and endorsed neck pain and headache for 2 weeks.  Seizure activity was witnessed in the emergency department.  CT scan of the head revealed extensive subarachnoid hemorrhage at the basilar cisterns, interhemispheric fissure, bilateral sylvian fissures greater on the left.  A large subdural hematoma of the left frontal, parietal and temporal regions also noted.  Marked mass effect upon the left hemisphere.  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  The patient was [facility] and critical care medicine and neurosurgery consulted.  She required intubation prior to transfer.  3% of IV saline and clevidipine initiated.  Neurology also consulted.  Nimodipine started 4/29 to prevent vasospasm.  Dr. Wynetta Emeryram recommended angiography with interventional radiology and possible embolization prior to craniotomy for evacuation of the subdural.  On 4/29, she underwent arteriogram by Dr. 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  Afterward, she was taken to the operating room where she underwent left-sided pterional craniotomy for evacuation of left-sided subdural hematoma with implantation of the  bone flap in the left abdominal wall. Intracranial drain removed 5/1. Continued on Keppra, Tube feeds started via Cortrak. Cleared for pharmacologic DVT prophylaxis.  Extubated on 5/2. Mild agitation requiring restraints. Hemdynamically stable. The patient requires inpatient medicine and rehabilitation evaluations and services for ongoing dysfunction secondary to nontraumatic subarachnoid bleed from left posterior communicating artery, subdural hematoma, status post craniectomy. ?  ?Review of Systems  ?Unable to perform ROS: Mental acuity  ?    ?Past Medical History:  ?Diagnosis Date  ? Angina at rest [facility](HCC)    ?  ?     ?Past Surgical History:  ?Procedure Laterality Date  ? CRANIOTOMY Left 08/27/2021  ?  Procedure: CRANIOTOMY FOR EVACUATION OF SUBDURAL HEMATOMA WITH PLACEMENT OF BONE FLAP IN ABDOMEN;  Surgeon: Donalee Citrinram, Gary, MD;  Location: [facility] OR;  Service: Neurosurgery;  Laterality: Left;  ? IR ANGIO INTRA EXTRACRAN SEL COM CAROTID INNOMINATE UNI R MOD SED   08/27/2021  ? IR ANGIO INTRA EXTRACRAN SEL INTERNAL CAROTID UNI L MOD SED   08/30/2021  ? IR ANGIO VERTEBRAL SEL SUBCLAVIAN INNOMINATE UNI R MOD SED   08/30/2021  ? IR ANGIOGRAM FOLLOW UP STUDY   08/27/2021  ? IR ANGIOGRAM FOLLOW UP STUDY   08/27/2021  ? IR ANGIOGRAM FOLLOW UP STUDY   08/27/2021  ? IR ANGIOGRAM FOLLOW UP STUDY   08/27/2021  ? IR ANGIOGRAM FOLLOW UP STUDY   08/27/2021  ? IR ANGIOGRAM FOLLOW UP STUDY   08/27/2021  ? IR ANGIOGRAM FOLLOW UP STUDY   08/27/2021  ? IR CT HEAD LTD   08/27/2021  ? IR NEURO EACH ADD'L AFTER BASIC UNI LEFT (MS)   08/30/2021  ? IR TRANSCATH/EMBOLIZ  08/27/2021  ? RADIOLOGY WITH ANESTHESIA N/A 08/27/2021  ?  Procedure: RADIOLOGY WITH ANESTHESIA;  Surgeon: Julieanne Cotton, MD;  Location: [facility] OR;  Service: Radiology;  Laterality: N/A;  ? STENT PLACEMENT VASCULAR ([facility] HX)      ?  ?History reviewed. No pertinent family history. ?Social History:  reports that she has been smoking cigarettes. She has been smoking an average of 1 pack per day. She  has never used smokeless tobacco. She reports current alcohol use. She reports that she does not use drugs. ?Allergies:  ?    ?Allergies  ?Allergen Reactions  ? Morphine    ? Penicillins Hives  ?  ?      ?Medications Prior to Admission  ?Medication Sig Dispense Refill  ? Magnesium 200 MG TABS Take 1 tablet by mouth daily.      ? nitroGLYCERIN (NITROSTAT) 0.4 MG SL tablet Place 0.4 mg under the tongue every 5 (five) minutes as needed for chest pain.      ? OVER THE COUNTER MEDICATION Take 1 tablet by mouth daily. Super Beets      ? zinc gluconate 50 MG tablet Take 50 mg by mouth daily.      ?  ?  ?  ?  ?Home: ?Home Living ?Family/patient expects to be discharged to:: Private residence ?Living Arrangements: Alone ?Available Help at Discharge: Family, Available 24 hours/day ?Type of Home: House ?Home Access: Stairs to enter ?Entrance Stairs-Number of Steps: 4 ?Entrance Stairs-Rails: None ?Home Layout: Two level ?Alternate Level Stairs-Number of Steps: 3 ?Alternate Level Stairs-Rails: Right ?Bathroom Shower/Tub: Tub/shower unit (walk in tub) ?Bathroom Toilet: Standard ?Bathroom Accessibility: Yes ?Home Equipment: Grab bars - tub/shower, Hand held shower head ?Additional Comments: good family support, on disability for back pain ? Lives With: Alone ?  ?Functional History: ?Prior Function ?Prior Level of Function : Independent/Modified Independent, Driving ?Mobility Comments: independent ?  ?Functional Status:  ?Mobility: ?Bed Mobility ?Overal bed mobility: Needs Assistance ?Bed Mobility: Sit to Supine ?Rolling: Min assist ?Sidelying to sit: Min guard, HOB elevated ?Supine to sit: Min assist, HOB elevated ?Sit to supine: Min assist ?General bed mobility comments: min A for trunk elevation, Pt reaching out for assist ?Transfers ?Overall transfer level: Needs assistance ?Equipment used: Rolling walker (2 wheels), None ?Transfers: Sit to/from Stand ?Sit to Stand: Min guard ?Bed to/from chair/wheelchair/BSC transfer type::  Step pivot ?Step pivot transfers: Min guard ?General transfer comment: Pt did well waiting for therapy while OT got lines together and worked on restraints, better impulse control for mobility today from previous session ?Ambulation/Gait ?Ambulation/Gait assistance: Min guard ?Gait Distance (Feet): 250 Feet ?Assistive device: Rolling walker (2 wheels) ?Gait Pattern/deviations: Step-through pattern ?General Gait Details: pt bumping into many objects in hallway with poor awareness. Pt is easily distracted, looking into every room on unit she passes when ambulating. Pt bumps into at least 5 chairs, nearly one person (person moved), and one cart. Pt ramming walker into 2 chairs in an effort to mobilize through narrow space, PT provides verbal cues to turn around. Pt often lifting walker over ojects at times, but declines ambulating without device ?Gait velocity: reduced ?Gait velocity interpretation: <1.31 ft/sec, indicative of household ambulator ?  ?ADL: ?ADL ?Overall ADL's : Needs assistance/impaired ?Eating/Feeding: NPO ?Grooming: Oral care, Minimal assistance, Standing, Wash/dry face, Wash/dry hands ?Grooming Details (indicate cue type and reason): standing at sink in bathroom, assist to manage lines and problem solve working around feeding tube ?Upper Body Bathing: Minimal assistance, Standing ?Upper Body Bathing Details (  indicate cue type and reason): for back in standing at sink ?Lower Body Bathing: Maximal assistance, +2 for physical assistance, +2 for safety/equipment, Sit to/from stand ?Upper Body Dressing : Moderate assistance, Sitting ?Upper Body Dressing Details (indicate cue type and reason): don gown like bathobe ?Lower Body Dressing: Minimal assistance, Sitting/lateral leans ?Lower Body Dressing Details (indicate cue type and reason): adjusting sock sitting EOB ?Toilet Transfer: Rolling walker (2 wheels), Ambulation, Regular Toilet, Minimal assistance ?Toilet Transfer Details (indicate cue type and  reason): assist for cues, and to manage RW ?Toileting- Architect and Hygiene: Min guard, Sitting/lateral lean ?Toileting - Clothing Manipulation Details (indicate cue type and reason): provided wit

## 2021-09-12 NOTE — Progress Notes (Signed)
Inpatient Rehab Admissions Coordinator:  ° °I have a CIR bed for this Pt. Today. RN may call report to [phone]. ° °Avis Mcmahill, MS, CCC-SLP °Rehab Admissions Coordinator  °[phone] (celll) °[phone] (office) ° °

## 2021-09-12 NOTE — Progress Notes (Signed)
Speech Language Pathology Treatment: Cognitive-Linquistic  ?Patient Details ?Name: Kayla Maynard ?MRN: [id number] ?DOB: [birth year] ?Today's Date: 09/12/2021 ?Time: [id number] ?SLP Time Calculation (min) (ACUTE ONLY): 13 min ? ?Assessment / Plan / Recommendation ?Clinical Impression ? Pt seen for cognitive treatment and required max verbal and tactile assist. On arrival she was very restless, disoriented appearing. Therapist released her wrist restraints and repositioned her stating she was aware of reason of restraints. Therapist demonstrated calming techniques such as deep breathing, eye contact etc and pt able to return demonstrate with improved sustained attention. Session focused on basic orientation, problem solving as she was unable to participate in activities planned for session.  ?Used verbal and environmental cues to orient to place although difficulty visualizing closing her left eye. She was unable to recall place with 10 minute delay. Accurately stated season, year with semantic cue and month with choice of 2. Ms. Wilker began to fall asleep and could not remain awake. Not able to observe with po's and give  breakfast at present. ST will continue to follow for cognition and dysphagia.  ?  ?HPI  Ms. Kayla Maynard is a 66 y/o woman who presented with LOC and was found to have seizures in the ED. On head CT she was found to have [facility] & SDH. She had a L PICA aneurysm coiled and underwent L pterional craniotomy for evacuation of SDH, bone flap in abdomen. Intubated from 4/28-5/2.  ?  ?   ?SLP Plan ? Continue with current plan of care ? ?  ?  ?Recommendations for follow up therapy are one component of a multi-disciplinary discharge planning process, led by the attending physician.  Recommendations may be updated based on patient status, additional functional criteria and insurance authorization. ?  ? ?Recommendations  ?   ?   ?    ?   ? ? ? ? Oral Care Recommendations: Oral care BID ?Follow Up Recommendations:  Acute inpatient rehab (3hours/day) ?Assistance recommended at discharge: Frequent or constant Supervision/Assistance ?SLP Visit Diagnosis: Cognitive communication deficit (R41.841);Dysphagia, unspecified (R13.10) ?Plan: Continue with current plan of care ? ? ? ? ?  ?  ? ? ?Houston Siren ? ?09/12/2021, 9:27 AM ?

## 2021-09-12 NOTE — Progress Notes (Signed)
Seen briefly.  Mildly confused. ?Going to CIR, PCCM available PRN. ? ?Myrla Halsted MD ?

## 2021-09-12 NOTE — Progress Notes (Signed)
?  ?PMR Admission Coordinator Pre-Admission Assessment ?  ?Patient: Kayla Maynard is an 66 y.o., female ?MRN: [id number] ?DOB: 1955-05-18 ?Height: 5\' 6"  (167.6 cm) ?Weight: 88 kg ?  ?Insurance Information ?HMO:     PPO:      PCP:      IPA:      80/20:      OTHER:  ?PRIMARY: Medicare       Policy#: [id number]       Subscriber: Pt. ?Phone#: Verified online    Fax#:  ?Pre-Cert#:       Employer:  ?Benefits:  Phone #:      Name:  ?Eff. Date: Parts A effective 05/02/2003 and B effective 07/30/2004 Deduct: $1600      Out of Pocket Max:  None      Life Max: N/A  ?CIR: 100%      SNF: 100 days ?Outpatient: 80%     Co-Pay: 20% ?Home Health: 100%      Co-Pay: none ?DME: 80%     Co-Pay: 20% ?Providers: patient's choice ?SECONDARY:       Policy#:      Phone#:  ?  ?Financial Counselor:       Phone#:  ?  ?The ?Data Collection Information Summary? for patients in Inpatient Rehabilitation Facilities with attached ?Privacy Act [place] Records? was provided and verbally reviewed with: N/A ?  ?Emergency Contact Information ?Contact Information   ?  ?  Name Relation Home Work Mobile  ?  curry,byron Son     [phone]  ?  Burr Medico Sister     [phone]  ?  ?   ?  ?  ?Current Medical History  ?Patient Admitting Diagnosis: Seizure, aneurysm ?History of Present Illness: Pt is a 66 yo F PMH angina presented to [place] 4/29 after loss of consciousness at home. Family actually started CPR per 911 advice though unclear if pulses were actually lost. It was felt that pt likely had a seizure. On EMS arrival pt awake, hypoglycemic, got d10, was awake.  In ED denied hx sz. Endorsed HA and neck pain, worse x 2 wk. Had 2 additional sz in ED. Intubated for airway protection. Found to have ICH -- SAH, SDH. Became bradycardic, Started on 3% and cleviprex. Pt. transferred [facility] to [facility] for further care. 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 Pt. Also underwent left-sided pterional craniotomy for evacuation of left-sided subdural hematoma with implantation of the bone flap in the left abdominal wall. Scans on 4/30 revealed considerable improvement postintervention with near complete resolution of midline shift. Intracranial drain removed 5/1 and pt. Was extubated 5/2. Pt. Seen by PT, OT, and SLP who recommended CIR to assist return to PLOF.  ?  ?Complete NIHSS TOTAL: 3 ?  ?Patient's medical record from [facility] has been reviewed by the rehabilitation admission coordinator and physician. ?  ?Past Medical History  ?    ?Past Medical History:  ?Diagnosis Date  ? Angina at rest [facility])    ?  ?  ?Has the patient had major surgery during 100 days prior to admission? Yes ?  ?Family History   ?family history is not on file. ?  ?Current Medications ?  ?Current Facility-Administered Medications:  ?  Place/Maintain arterial line, , , Until Discontinued **AND** 0.9 %  sodium chloride infusion, , Intra-arterial, PRN, Kary Kos, MD ?  0.9 %  sodium chloride infusion, , Intravenous, PRN, Kary Kos, MD, Stopped at 08/28/21 1209 ?  0.9 %  sodium chloride infusion, , Intravenous, Continuous, Agarwala, Ravi, MD, Last Rate: 75 mL/hr at 08/31/21 1400, Infusion Verify at 08/31/21 1400 ?  acetaminophen (TYLENOL) tablet 650 mg, 650 mg, Per Tube, Q4H PRN, 650 mg at 08/31/21 1145 **OR** acetaminophen (TYLENOL) 160 MG/5ML solution 650 mg, 650 mg, Per Tube, Q4H PRN **OR** [DISCONTINUED] acetaminophen (TYLENOL) suppository 650 mg, 650 mg, Rectal, Q4H PRN, Deveshwar, Sanjeev, MD ?  chlorhexidine (PERIDEX) 0.12 % solution 15 mL, 15 mL, Mouth Rinse, BID, Noemi Chapel P, DO, 15 mL at 08/31/21 0955 ?  Chlorhexidine Gluconate Cloth 2 % PADS 6 each, 6 each, Topical, Q0600, Kary Kos, MD, 6 each at 08/30/21 2101 ?  feeding supplement (PROSource TF) liquid 45 mL, 45 mL, Per Tube,  TID, Noemi Chapel P, DO, 45 mL at 08/31/21 0955 ?  feeding supplement (VITAL 1.5 CAL) liquid 1,000 mL, 1,000 mL, Per Tube, Continuous, Julian Hy, DO, Last Rate: 50 mL/hr at 08/30/21 2038, 1,000 mL at 08/30/21 2038 ?  fiber (NUTRISOURCE FIBER) 1 packet, 1 packet, Per Tube, BID, Jennelle Human B, NP, 1 packet at 08/31/21 1145 ?  heparin injection 5,000 Units, 5,000 Units, Subcutaneous, Q8H, Agarwala, Ravi, MD, 5,000 Units at 08/31/21 1400 ?  HYDROmorphone (DILAUDID) injection 0.25 mg, 0.25 mg, Intravenous, Q2H PRN, Agarwala, Ravi, MD, 0.25 mg at 08/30/21 1248 ?  insulin aspart (novoLOG) injection 2-6 Units, 2-6 Units, Subcutaneous, Q4H, Ogan, Okoronkwo U, MD, 4 Units at 08/31/21 1154 ?  labetalol (NORMODYNE) injection 10-40 mg, 10-40 mg, Intravenous, Q10 min PRN, Kary Kos, MD, 20 mg at 08/29/21 1749 ?  levETIRAcetam (KEPPRA) IVPB 1000 mg/100 mL premix, 1,000 mg, Intravenous, Q12H, Kary Kos, MD, Stopped at 08/31/21 [phone] ?  MEDLINE mouth rinse, 15 mL, Mouth Rinse, q12n4p, Noemi Chapel P, DO, 15 mL at 08/31/21 1148 ?  niMODipine (NIMOTOP) capsule 60 mg, 60 mg, Oral, Q4H **OR** niMODipine (NYMALIZE) 6 MG/ML oral solution 60 mg, 60 mg, Per Tube, Q4H, Noemi Chapel P, DO, 60 mg at 08/31/21 1145 ?  ondansetron (ZOFRAN) tablet 4 mg, 4 mg, Oral, Q4H PRN **OR** ondansetron (ZOFRAN) injection 4 mg, 4 mg, Intravenous, Q4H PRN, Kary Kos, MD, 4 mg at 08/30/21 1312 ?  potassium PHOSPHATE 15 mmol in dextrose 5 % 250 mL infusion, 15 mmol, Intravenous, Once, Carlis Abbott, Mattheus Rauls P, DO ?  promethazine (PHENERGAN) tablet 12.5-25 mg, 12.5-25 mg, Oral, Q4H PRN, Kary Kos, MD ?  ?Patients Current Diet:  ?Diet Order   ?  ?         ?    Diet full liquid Room service appropriate? Yes; Fluid consistency: Thin  Diet effective now       ?  ?  ?   ?  ?  ?   ?  ?  ?Precautions / Restrictions ?Precautions ?Precautions: Fall ?Precaution Comments: L crani, bone flap in L abdominal wall ?Restrictions ?Weight Bearing Restrictions: No  ?  ?Has the  patient had 2 or more falls or a fall with injury in the past year? Yes ?  ?Prior Activity Level ?Community (5-7x/wk): Pt. was active in the community PTA ?  ?Prior Functional Level ?Self Care: Did the patient need help bathing, dressing, using the toilet or eating? Independent ?  ?Indoor Mobility: Did the patient need assistance with walking from room to room (with or without device)? Independent ?  ?Stairs: Did the patient need assistance with internal  or external stairs (with or without device)? Independent ?  ?Functional Cognition: Did the patient need help planning regular tasks such as shopping or remembering to take medications? Independent ?  ?Patient Information ?Are you of Hispanic, Latino/a,or Spanish origin?: A. No, not of Hispanic, Latino/a, or Spanish origin (Information obtained via proxy) ?What is your race?: A. White ?Do you need or want an interpreter to communicate with a doctor or health care staff?: 0. No ?  ?Patient's Response To:  ?Health Literacy and Transportation ?Is the patient able to respond to health literacy and transportation needs?: No ?  ?Home Assistive Devices / Equipment ?Home Equipment: Grab bars - tub/shower, Hand held shower head ?  ?Prior Device Use: Indicate devices/aids used by the patient prior to current illness, exacerbation or injury? None of the above ?  ?Current Functional Level ?Cognition ?  Overall Cognitive Status: Impaired/Different from baseline ?Current Attention Level: Sustained ?Orientation Level: Oriented to person, Oriented to place, Oriented to time, Disoriented to situation ?Following Commands: Follows one step commands consistently ?Safety/Judgement: Decreased awareness of safety, Decreased awareness of deficits ?General Comments: pt a bit impulsive for mobilizing. Requires step by step cues for safety. follows most commands with increased time. Fell asleep whenever not actively stimulated ?   ?Extremity Assessment ?(includes Sensation/Coordination) ?   Upper Extremity Assessment: Defer to OT evaluation ?RUE Deficits / Details: generally weak, full AAROM available, slow and deliberate movements ?RUE Coordination: decreased fine motor, decreased gross motor ?LUE D

## 2021-09-12 NOTE — Progress Notes (Signed)
Physical Therapy Treatment ?Patient Details ?Name: Kayla Maynard ?MRN: [id number] ?DOB: February 15, 1956 ?Today's Date: 09/12/2021 ? ? ?History of Present Illness Kayla Maynard is a 66 y/o woman who presented with LOC and was found to have seizures x3 in the ED. CT showed extensive subarachnoid hemorrhage bilaterally and large SDH L frontal/ parietal/ temporal. 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  PMH: angina, back surgery ? ?  ?PT Comments  ? ? Pt making steady gains toward goals.  Emphasis on following direction, transitions, sitting balance, scooting, progression of gait, balance challenge and overall safety. ?   ?Recommendations for follow up therapy are one component of a multi-disciplinary discharge planning process, led by the attending physician.  Recommendations may be updated based on patient status, additional functional criteria and insurance authorization. ? ?Follow Up Recommendations ? Acute inpatient rehab (3hours/day) ?  ?  ?Assistance Recommended at Discharge Intermittent Supervision/Assistance  ?Patient can return home with the following A lot of help with bathing/dressing/bathroom;Assistance with cooking/housework;Assist for transportation;Help with stairs or ramp for entrance;A little help with walking and/or transfers ?  ?Equipment Recommendations ?  (TBA)  ?  ?Recommendations for Other Services   ? ? ?  ?Precautions / Restrictions Precautions ?Precautions: Fall ?Precaution Comments: L crani, bone flap in L abdominal wall  ?  ? ?Mobility ? Bed Mobility ?Overal bed mobility: Needs Assistance ?Bed Mobility: Supine to Sit, Sit to Supine ?  ?  ?Supine to sit: Min assist, HOB elevated ?Sit to supine: Min assist ?  ?  ?  ? ?Transfers ?Overall transfer level: Needs assistance ?  ?Transfers: Sit to/from Stand ?Sit to Stand: Min assist ?  ?  ?  ?  ?  ?General transfer comment: pt need minimal assist for toilet transfers and peri care in standing at the toilet. ?   ? ?Ambulation/Gait ?Ambulation/Gait assistance: Min assist, +2 physical assistance ?Gait Distance (Feet): 90 Feet ?  ?Gait Pattern/deviations: Step-through pattern ?Gait velocity: reduced ?Gait velocity interpretation: <1.8 ft/sec, indicate of risk for recurrent falls ?  ?General Gait Details: generally unsteady with drift, mostly R,  mild staggering with scanning, worsening with fatigue. ? ? ?Stairs ?  ?  ?  ?  ?  ? ? ?Wheelchair Mobility ?  ? ?Modified Rankin (Stroke Patients Only) ?Modified Rankin (Stroke Patients Only) ?Pre-Morbid Rankin Score: No symptoms ?Modified Rankin: Moderately severe disability ? ? ?  ?Balance Overall balance assessment: Needs assistance ?Sitting-balance support: No upper extremity supported, Feet supported ?Sitting balance-Leahy Scale: Fair ?  ?  ?Standing balance support: Single extremity supported, Reliant on assistive device for balance ?Standing balance-Leahy Scale: Poor ?Standing balance comment: reliant on UE support ?  ?  ?  ?  ?  ?  ?  ?  ?  ?  ?  ?  ? ?  ?Cognition Arousal/Alertness: Awake/alert ?Behavior During Therapy: Impulsive ?Overall Cognitive Status: Difficult to assess ?  ?  ?  ?  ?  ?  ?  ?  ?  ?  ?Current Attention Level: Focused, Sustained ?  ?Following Commands: Follows one step commands with increased time ?Safety/Judgement: Decreased awareness of safety, Decreased awareness of deficits ?Awareness: Intellectual ?Problem Solving: Slow processing, Difficulty sequencing, Requires verbal cues ?  ?  ?  ? ?  ?Exercises   ? ?  ?General Comments General comments (skin integrity, edema, etc.): family present and supportive ?  ?  ? ?Pertinent Vitals/Pain Pain Assessment ?Pain Assessment: Faces ?Faces Pain Scale: Hurts a little bit ?Pain Location:  head ?Pain Descriptors / Indicators: Grimacing ?Pain Intervention(s): Monitored during session  ? ? ?Home Living   ?  ?  ?  ?  ?  ?  ?  ?  ?  ?   ?  ?Prior Function    ?  ?  ?   ? ?PT Goals (current goals can now be found in the  care plan section) Acute Rehab PT Goals ?PT Goal Formulation: With patient ?Time For Goal Achievement: 09/13/21 ?Potential to Achieve Goals: Good ?Progress towards PT goals: Progressing toward goals ? ?  ?Frequency ? ? ? Min 3X/week ? ? ? ?  ?PT Plan Current plan remains appropriate  ? ? ?Co-evaluation   ?  ?  ?  ?  ? ?  ?AM-PAC PT "6 Clicks" Mobility   ?Outcome Measure ? Help needed turning from your back to your side while in a flat bed without using bedrails?: A Little ?Help needed moving from lying on your back to sitting on the side of a flat bed without using bedrails?: A Little ?Help needed moving to and from a bed to a chair (including a wheelchair)?: A Little ?Help needed standing up from a chair using your arms (e.g., wheelchair or bedside chair)?: A Little ?Help needed to walk in hospital room?: A Little ?Help needed climbing 3-5 steps with a railing? : A Lot ?6 Click Score: 17 ? ?  ?End of Session   ?Activity Tolerance: Patient tolerated treatment well ?Patient left: in bed;with call bell/phone within reach;with family/visitor present ?Nurse Communication: Mobility status ?PT Visit Diagnosis: Unsteadiness on feet (R26.81);Pain;Difficulty in walking, not elsewhere classified (R26.2) ?Pain - part of body:  (head) ?  ? ? ?Time: [id number] ?PT Time Calculation (min) (ACUTE ONLY): 39 min ? ?Charges:  $Gait Training: 8-22 mins ?$Therapeutic Activity: 8-22 mins ?$Self Care/Home Management: 8-22          ?          ? ?09/12/2021 ? ?Ginger Carne., PT ?Acute Rehabilitation Services ?[phone]  (pager) ?[phone]  (office) ? ? ?Tessie Fass Kaivon Livesey ?09/12/2021, 12:36 PM ? ?

## 2021-09-13 DIAGNOSIS — R7989 Other specified abnormal findings of blood chemistry: Secondary | ICD-10-CM

## 2021-09-13 LAB — COMPREHENSIVE METABOLIC PANEL
ALT: 15 U/L (ref 0–44)
AST: 21 U/L (ref 15–41)
Albumin: 3.4 g/dL — ABNORMAL LOW (ref 3.5–5.0)
Alkaline Phosphatase: 120 U/L (ref 38–126)
Anion gap: 10 (ref 5–15)
BUN: 79 mg/dL — ABNORMAL HIGH (ref 8–23)
CO2: 14 mmol/L — ABNORMAL LOW (ref 22–32)
Calcium: 9.9 mg/dL (ref 8.9–10.3)
Chloride: 110 mmol/L (ref 98–111)
Creatinine, Ser: 1.14 mg/dL — ABNORMAL HIGH (ref 0.44–1.00)
GFR, Estimated: 54 mL/min — ABNORMAL LOW (ref >60)
Glucose, Bld: 128 mg/dL — ABNORMAL HIGH (ref 70–99)
Potassium: 6 mmol/L — ABNORMAL HIGH (ref 3.5–5.1)
Sodium: 134 mmol/L — ABNORMAL LOW (ref 135–145)
Total Bilirubin: 0.8 mg/dL (ref 0.3–1.2)
Total Protein: 6.9 g/dL (ref 6.5–8.1)

## 2021-09-13 LAB — CBC WITH DIFFERENTIAL/PLATELET
Abs Immature Granulocytes: 0.2 10*3/uL — ABNORMAL HIGH (ref 0.00–0.07)
Basophils Absolute: 0.1 10*3/uL (ref 0.0–0.1)
Basophils Relative: 1 %
Eosinophils Absolute: 0.2 10*3/uL (ref 0.0–0.5)
Eosinophils Relative: 1 %
HCT: 41.5 % (ref 36.0–46.0)
Hemoglobin: 13.4 g/dL (ref 12.0–15.0)
Immature Granulocytes: 1 %
Lymphocytes Relative: 11 %
Lymphs Abs: 1.6 10*3/uL (ref 0.7–4.0)
MCH: 32.1 pg (ref 26.0–34.0)
MCHC: 32.3 g/dL (ref 30.0–36.0)
MCV: 99.3 fL (ref 80.0–100.0)
Monocytes Absolute: 0.8 10*3/uL (ref 0.1–1.0)
Monocytes Relative: 6 %
Neutro Abs: 11.2 10*3/uL — ABNORMAL HIGH (ref 1.7–7.7)
Neutrophils Relative %: 80 %
Platelets: 403 10*3/uL — ABNORMAL HIGH (ref 150–400)
RBC: 4.18 MIL/uL (ref 3.87–5.11)
RDW: 12.7 % (ref 11.5–15.5)
WBC: 14.1 10*3/uL — ABNORMAL HIGH (ref 4.0–10.5)
nRBC: 0 % (ref 0.0–0.2)

## 2021-09-13 LAB — GLUCOSE, CAPILLARY
Glucose-Capillary: 117 mg/dL — ABNORMAL HIGH (ref 70–99)
Glucose-Capillary: 119 mg/dL — ABNORMAL HIGH (ref 70–99)
Glucose-Capillary: 126 mg/dL — ABNORMAL HIGH (ref 70–99)
Glucose-Capillary: 133 mg/dL — ABNORMAL HIGH (ref 70–99)
Glucose-Capillary: 139 mg/dL — ABNORMAL HIGH (ref 70–99)
Glucose-Capillary: 95 mg/dL (ref 70–99)

## 2021-09-13 MED ORDER — SODIUM CHLORIDE 0.9 % IV SOLN: INTRAVENOUS | Status: DC

## 2021-09-13 MED ORDER — NICOTINE 7 MG/24HR TD PT24
7.0000 mg | MEDICATED_PATCH | Freq: Every day | TRANSDERMAL | Status: DC
  Administered 2021-09-13 – 2021-09-25 (×13): 7 mg via TRANSDERMAL
  Filled 2021-09-13 (×17): qty 1

## 2021-09-13 MED ORDER — FREE WATER
100.0000 mL | Freq: Four times a day (QID) | Status: DC
  Administered 2021-09-13 – 2021-09-16 (×12): 100 mL

## 2021-09-13 NOTE — Progress Notes (Signed)
?                                                       PROGRESS NOTE ? ? ?Subjective/Complaints: ?Pt had a better night. Was up with OT when I came in, putting on pants. Much more alert today ? ?ROS: Limited due to cognitive/behavioral  ? ? ?Objective: ?  ?No results found. ?Recent Labs  ?  09/12/21 ?0331 09/13/21 ?[id number]  ?WBC 13.2* 14.1*  ?HGB 13.4 13.4  ?HCT 41.3 41.5  ?PLT 455* 403*  ? ?Recent Labs  ?  09/12/21 ?0331 09/13/21 ?[id number]  ?NA 135 134*  ?K 4.5 6.0*  ?CL 107 110  ?CO2 16* 14*  ?GLUCOSE 124* 128*  ?BUN 61* 79*  ?CREATININE 1.24* 1.14*  ?CALCIUM 9.7 9.9  ? ? ?Intake/Output Summary (Last 24 hours) at 09/13/2021 1326 ?Last data filed at 09/13/2021 1256 ?Gross per 24 hour  ?Intake 765.15 ml  ?Output --  ?Net 765.15 ml  ?  ? ?  ? ?Physical Exam: ?Vital Signs ?Blood pressure 112/60, pulse 84, temperature 97.7 ?F (36.5 ?C), resp. rate 18, height 5\' 6"  (1.676 m), weight 76.7 kg, SpO2 100 %. ? ?General: Alert and oriented x 3, No apparent distress ?HEENT: Head is normocephalic, atraumatic, PERRLA, EOMI, sclera anicteric, oral mucosa pink and moist, dentition intact, ext ear canals clear, NGT ?Neck: Supple without JVD or lymphadenopathy ?Heart: Reg rate and rhythm. No murmurs rubs or gallops ?Chest: CTA bilaterally without wheezes, rales, or rhonchi; no distress ?Abdomen: Soft, sl-tender, non-distended, bowel sounds positive. ?Extremities: No clubbing, cyanosis, or edema. Pulses are 2+ ?Psych: Pt's affect is appropriate. Pt is cooperative ?Skin: Crani site clean dry. Donor site clean and dry also. Few scattered ecchymoses ?Neuro:  pt more alert. Definite word finding deficits and processing delays. Donning pants with extra time and verbal/tactile cueing. Moves all 4's fairly equally. No obvious sensory deficits.no resting tone.  ?Musculoskeletal: no focal jt or trunk pain   ? ? ?Assessment/Plan: ?1. Functional deficits which require 3+ hours per day of interdisciplinary therapy in a comprehensive inpatient rehab  setting. ?Physiatrist is providing close team supervision and 24 hour management of active medical problems listed below. ?Physiatrist and rehab team continue to assess barriers to discharge/monitor patient progress toward functional and medical goals ? ?Care Tool: ? ?Bathing ?   ?Body parts bathed by patient: Right arm, Left arm, Chest, Abdomen, Front perineal area, Buttocks, Right upper leg, Left upper leg, Face  ? Body parts bathed by helper: Left lower leg, Right lower leg ?  ?  ?Bathing assist Assist Level: Moderate Assistance - Patient 50 - 74% ?  ?  ?Upper Body Dressing/Undressing ?Upper body dressing   ?What is the patient wearing?: Pull over shirt ?   ?Upper body assist Assist Level: Minimal Assistance - Patient > 75% ?   ?Lower Body Dressing/Undressing ?Lower body dressing ? ? ?   ?What is the patient wearing?: Pants, Underwear/pull up ? ?  ? ?Lower body assist Assist for lower body dressing: Maximal Assistance - Patient 25 - 49% ?   ? ?Toileting ?Toileting    ?Toileting assist Assist for toileting: Minimal Assistance - Patient > 75% ?  ?  ?Transfers ?Chair/bed transfer ? ?Transfers assist ?   ? ?Chair/bed transfer assist level: Minimal Assistance - Patient > 75% ?  ?  ?  Locomotion ?Ambulation ? ? ?Ambulation assist ? ?   ? ?  ?  ?   ? ?Walk 10 feet activity ? ? ?Assist ?   ? ?  ?   ? ?Walk 50 feet activity ? ? ?Assist   ? ?  ?   ? ? ?Walk 150 feet activity ? ? ?Assist   ? ?  ?  ?  ? ?Walk 10 feet on uneven surface  ?activity ? ? ?Assist   ? ? ?  ?   ? ?Wheelchair ? ? ? ? ?Assist   ?  ?  ? ?  ?   ? ? ?Wheelchair 50 feet with 2 turns activity ? ? ? ?Assist ? ?  ?  ? ? ?   ? ?Wheelchair 150 feet activity  ? ? ? ?Assist ?   ? ? ?   ? ?Blood pressure 112/60, pulse 84, temperature 97.7 ?F (36.5 ?C), resp. rate 18, height 5\' 6"  (1.676 m), weight 76.7 kg, SpO2 100 %. ? ?Medical Problem List and Plan: ?1. Functional deficits secondary to nontraumatic subarachnoid bleed from left posterior communicating artery,  subdural hematoma status post coiling/craniectomy. ?            -patient may shower ?            -ELOS/Goals: 16-22 days, supervision to min assist goals ?            -spoke with family about rehab, medical plan today ?2.  Antithrombotics: ?-DVT/anticoagulation:  Pharmaceutical: Heparin ?            -antiplatelet therapy: None ?3. Pain Management: Tylenol, hydrocodone as needed ?4. Mood/sleep: Pt's days/nights upside down. ?            -start sleep chart ?            -antipsychotic agents: add low dose seroquel tonight for sleep with back up dose ?            -melatonin ?5. Neuropsych: This patient is not capable of making decisions on her own behalf. ?            -soft wrist restraints for safety/NGT ?6. Skin/Wound Care: Routine skin care checks ?            -- monitor surgical incisions ?7. Fluids/Electrolytes/Nutrition: Intake still minimal ?-- Dysphagia 3 diet, thin liquids ?-encourage PO ?-will begin IVF and add free water as BUN substantially elevated from 5/12  ?            --continue Osmolite 1.5 via core track; 83 mL/h x 12 hours (run overnight) ?            -- Continue Prosource, Ensure ?            -- Continue CBGs 4 times daily and sliding scale insulin ?10: Seizure activity on admission provoked by [facility]:  ?--continue Keppra ?--continue Nimotop through 5/20 ?11: s/p craniectomy: plan cranioplasty in 6-8 weeks ?  -family does not want helmet- ? ?LOS: ?1 days ?A FACE TO FACE EVALUATION WAS PERFORMED ? ?Meredith Staggers ?09/13/2021, 1:26 PM  ? ?  ?

## 2021-09-13 NOTE — Progress Notes (Signed)
Inpatient Rehabilitation  Patient information reviewed and entered into eRehab system by Eldor Conaway M. Jahvon Gosline, M.A., CCC/SLP, PPS Coordinator.  Information including medical coding, functional ability and quality indicators will be reviewed and updated through discharge.    

## 2021-09-13 NOTE — Discharge Summary (Signed)
?Physician Discharge Summary  ?Patient ID: ?Kayla Maynard ?MRN: [id number] ?DOB/AGE: [birth year] 64 y.o. ? ?Admit date: 08/27/2021 ?Discharge date: 09/13/2021 ? ?Admission Diagnoses:  ?Subarachnoid Hemorrhage ? ?Discharge Diagnoses:  ?Same ?Principal Problem: ?  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  Subdural hematoma, acute (HCC) ?  On mechanically assisted ventilation (HCC) ?  Compression of brain due to nontraumatic subarachnoid hemorrhage (HCC) ?  Seizure (HCC) ?  SAH (subarachnoid hemorrhage) (HCC) ?  Acute respiratory failure with hypoxia (HCC) ?  Aspiration into airway ?  Delirium ? ? ?Discharged Condition: Stable ? ?Hospital Course:  ?Kayla Maynard is a 66 y.o. female initially presenting the hospital with sudden onset of severe headache and a seizure.  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  Patient underwent coil embolization followed by left craniectomy for evacuation of subdural hematoma.  Patient actually made an excellent neurologic recovery, and was monitored in the neuro intensive care unit without any clinical signs, symptoms or ultrasonographic evidence of vasospasm.  Patient was also seen by physical and Occupational Therapy and was deemed to be a good candidate for inpatient rehab.  She was monitored in the intensive care unit for total of 14 days without significant change in neurologic condition and was ultimately discharged to inpatient rehab in stable condition. ? ?Patient's staples can be removed upon admission to rehab.  Plan will be for outpatient cranioplasty once she has been discharged from comprehensive rehab. ? ?Treatments: Surgery - Left cranioplasty ?Coil embolization L Pcom aneurysm ? ?Discharge Exam: ?Blood pressure (!) 100/47, pulse 75, temperature 97.6 ?F  (36.4 ?C), temperature source Oral, resp. rate 18, height 5\' 6"  (1.676 m), weight 76.7 kg, SpO2 100 %. ?Awake, alert ?Speech fluent, appropriate ?CN grossly intact ?5/5 BUE/BLE ?Wound c/d/i ? ?Disposition: Discharge disposition: 90-DC/txfr to inpt rehab facility with planned acute care hosp IP admission ? ? ? ? ? ? ?Discharge Instructions   ? ? Diet - low sodium heart healthy   Complete by: As directed ?  ? Discharge wound care:   Complete by: As directed ?  ? Discharge Instructions ? ?No restriction in activities, slowly increase your activity back to normal.  ? ?Okay to shower on the day of discharge. Be gentle when cleaning your incision. Use regular soap and water. If that is uncomfortable, try using baby shampoo. Do not submerge the wound under water for 2 weeks after surgery. ? ?Follow up with Dr. in 2 weeks after discharge. If you do not already have a discharge appointment, please call his office at [phone] to schedule a follow up appointment. If you have any concerns or questions, please call the office and let [phone] know.  ? Increase activity slowly   Complete by: As directed ?  ? ?  ? ?Allergies as of 09/12/2021   ? ?   Reactions  ? Morphine   ? Penicillins Hives  ? ?  ? ?  ?Medication List  ?  ? ?TAKE these medications   ? ?chlorhexidine 0.12 % solution ?Commonly known as: PERIDEX ?15 mLs by Mouth Rinse route 2 (two) times daily. ?  ?feeding supplement Liqd ?Place 237 mLs into feeding tube 2 (two) times daily between meals. ?  ?feeding supplement (OSMOLITE 1.5 CAL) Liqd ?Place 996 mLs into feeding tube continuous. ?  ?feeding supplement (PROSource TF) liquid ?Place 45 mLs  into feeding tube 3 (three) times daily. ?  ?fiber Pack packet ?Place 1 packet into feeding tube 2 (two) times daily. ?  ?Gerhardt's butt cream Crea ?Apply 1 application. topically as needed for irritation (skin irritation due to incontinence). ?  ?levETIRAcetam 100 MG/ML solution ?Commonly known as: KEPPRA ?Place 10 mLs  (1,000 mg total) into feeding tube 2 (two) times daily. ?  ?lisinopril 20 MG tablet ?Commonly known as: ZESTRIL ?Take 1 tablet (20 mg total) by mouth daily. ?  ?loperamide HCl 1 MG/7.5ML suspension ?Commonly known as: IMODIUM ?Place 15 mLs (2 mg total) into feeding tube as needed for diarrhea or loose stools. ?  ?Magnesium 200 MG Tabs ?Take 1 tablet by mouth daily. ?  ?Melatonin 10 MG Tabs ?Take 10 mg by mouth at bedtime. ?  ?niMODipine 6 MG/ML Soln ?Commonly known as: NYMALIZE ?Place 10 mLs (60 mg total) into feeding tube every 4 (four) hours for 5 days. ?  ?nitroGLYCERIN 0.4 MG SL tablet ?Commonly known as: NITROSTAT ?Place 0.4 mg under the tongue every 5 (five) minutes as needed for chest pain. ?  ?OVER THE COUNTER MEDICATION ?Take 1 tablet by mouth daily. Super Beets ?  ?zinc gluconate 50 MG tablet ?Take 50 mg by mouth daily. ?  ? ?  ? ?  ?  ? ? ?  ?Discharge Care Instructions  ?(From admission, onward)  ?  ? ? ?  ? ?  Start     Ordered  ? 09/12/21 0000  Discharge wound care:       ?Comments: Discharge Instructions ? ?No restriction in activities, slowly increase your activity back to normal.  ? ?Okay to shower on the day of discharge. Be gentle when cleaning your incision. Use regular soap and water. If that is uncomfortable, try using baby shampoo. Do not submerge the wound under water for 2 weeks after surgery. ? ?Follow up with Dr. Conchita Paris in 2 weeks after discharge. If you do not already have a discharge appointment, please call his office at [phone] to schedule a follow up appointment. If you have any concerns or questions, please call the office and let us know.  ? 09/12/21 1025  ? ?  ?  ? ?  ? ? ? ?Signed: ?Jackelyn Hoehn ?09/13/2021, 7:58 AM ? ? ?

## 2021-09-13 NOTE — Discharge Summary (Signed)
Physician Discharge Summary  Patient ID: Kayla Maynard MRN: [id number] DOB/AGE: 1956/03/19 66 y.o.  Admit date: 09/12/2021 Discharge date: 09/28/2021  Discharge Diagnoses:  Principal Problem:   ICH (intracerebral hemorrhage) (HCC) Active problems: Functional deficits secondary to subarachnoid bleed Poor sleep hygiene Anorexia Seizure activity Headache Hyperkalemia   Discharged Condition: good  Significant Diagnostic Studies:    Labs:  Basic Metabolic Panel: Recent Labs  Lab 09/22/21 0908 09/23/21 0527 09/26/21 0602  NA 135 131* 136  K 4.0 3.8 3.9  CL 102 103 105  CO2 23 21* 23  GLUCOSE 111* 96 92  BUN 19 13 13   CREATININE 1.03* 0.90 1.02*  CALCIUM 9.3 9.1 9.1    CBC: Recent Labs  Lab 09/26/21 0602  WBC 8.8  HGB 11.8*  HCT 34.3*  MCV 93.7  PLT 237    CBG: No results for input(s): GLUCAP in the last 168 hours.   Brief HPI:   Kayla Maynard is a 66 y.o. female who called her sister on 08/27/2020 feeling ill.  Her sister arrived and found her unresponsive.  Seizure type activity noted.  Brought to the emergency department where seizure activity was witnessed.  Was transferred to [facility] for critical care medicine and neurosurgery consulted.  CT of the head revealed extensive subarachnoid hemorrhage.  She underwent angiography with interventional radiology and endovascular obliteration of the left posterior communicating artery aneurysm with coiling.  She was then taken to the operating room where she underwent left-sided craniotomy for evacuation of subdural hematoma with implantation of the bone flap in the left abdominal wall.   Hospital Course: Kayla Maynard was admitted to rehab 09/12/2021 for inpatient therapies to consist of PT, ST and OT at least three hours five days a week. Past admission physiatrist, therapy team and rehab RN have worked together to provide customized collaborative inpatient rehab. Started on low-dose Seroquel for  nighttime sleep as well as melatonin. Follow-up chemistries revealed elevated BUN and creatinine. IVFs initiated. Creatine normalized on 5/17. BUN remained elevated and IVFs continued 5/17. Remains lethargic and fatigued with interaction during the day with poor arousal lasting < 30 seconds. Topamax started 25 mg at bedtime on 5/18. Follow-up BMP with elevated serum potassium therefore ACEI held. Ritalin added for arousal/initiation/attention on 5/19. Nimodipine completed on 5/20. Rate of tube feeds decreased to 40 mL/hr for 12 hours to help stimulate appetite. Still with insomnia therefore trazodone increased to 75 mg at bedtime. Agitation improved on 2/22 and restraints removed, but remains quite confused. Family asked to increase time at bedside if possible.  K+ improved to 4.1.   Started Megace 5/22. After SLP session on 5/22, diet upgraded to dysphagia 3 diet. Topamax 25 mg was continued with improvement in headache.  Improved attention with Ritalin, however she remained quite confused.  Her appetite improved adequately to remove Cortrack.  Discussed use of helmet with the patient's son and family in agreement.  Megace was added to improve appetite. Diffuse rash noted on 5/30>>no changes to meds except to change to pill form. Keppra discontinued and trial of Depakote 250 mg BID and given Benadryl for rash. Improvement with rash, attention and appetite. Stable for discharge with son.  Blood pressures were monitored on TID basis and remained controlled.  Rehab course: During patient's stay in rehab weekly team conferences were held to monitor patient's progress, set goals and discuss barriers to discharge. At admission, patient required max verbal cues and total A for orientation and attention. Required min- max with  basic self-  She  has had improvement in activity tolerance, balance, postural control as well as ability to compensate for deficits. She has had improvement in functional use RUE/LUE  and  RLE/LLE as well as improvement in awareness Supervision without AD on discharge, Intermittent supervision with meals.  Disposition: Home Discharge disposition: 01-Home or Self Care      Diet: Regular  Special Instructions:  Wear helmet for protection. Recommend establishing PCP.  No driving, alcohol consumption or tobacco use.   30-35 minutes were spent on discharge planning and discharge summary.  Discharge Instructions     Ambulatory referral to Physical Medicine Rehab   Complete by: As directed    Hospital follow-up   Discharge patient   Complete by: As directed    Discharge disposition: 01-Home or Self Care   Discharge patient date: 09/28/2021      Allergies as of 09/28/2021       Reactions   Morphine    Penicillins Hives        Medication List     STOP taking these medications    chlorhexidine 0.12 % solution Commonly known as: PERIDEX   feeding supplement (OSMOLITE 1.5 CAL) Liqd   feeding supplement (PROSource TF) liquid   feeding supplement Liqd   fiber Pack packet   Gerhardt's butt cream Crea   levETIRAcetam 100 MG/ML solution Commonly known as: KEPPRA   lisinopril 20 MG tablet Commonly known as: ZESTRIL   loperamide HCl 1 MG/7.5ML suspension Commonly known as: IMODIUM   Magnesium 200 MG Tabs   niMODipine 6 MG/ML Soln Commonly known as: NYMALIZE   nitroGLYCERIN 0.4 MG SL tablet Commonly known as: NITROSTAT   OVER THE COUNTER MEDICATION   zinc gluconate 50 MG tablet       TAKE these medications    acetaminophen 325 MG tablet Commonly known as: TYLENOL Take 1-2 tablets (325-650 mg total) by mouth every 4 (four) hours as needed for mild pain.   divalproex 250 MG DR tablet Commonly known as: DEPAKOTE Take 1 tablet (250 mg total) by mouth every 12 (twelve) hours.   hydrocortisone cream 1 % Apply topically 4 (four) times daily as needed for itching.   megestrol 400 MG/10ML suspension Commonly known as: MEGACE Take 10 mLs  (400 mg total) by mouth daily.   Melatonin 10 MG Tabs Take 10 mg by mouth at bedtime.   methylphenidate 5 MG tablet Commonly known as: RITALIN Take 1 tablet (5 mg total) by mouth 2 (two) times daily with breakfast and lunch. Start taking on: September 29, 2021   multivitamin with minerals Tabs tablet Take 1 tablet by mouth daily.   topiramate 25 MG tablet Commonly known as: TOPAMAX Take 1 tablet (25 mg total) by mouth at bedtime.        Follow-up Information     Donalee Citrin, MD. Go to.   Specialty: Neurosurgery Why: October 20, 2021 at 9:45 am Contact information: [address] [place] [address] [phone]         Ranelle Oyster, MD Follow up.   Specialty: Physical Medicine and Rehabilitation Why: office will call you to arrange your appt (sent) Contact information: [address] [place] [address] [phone]                 Signed: Milinda Antis 09/28/2021, 9:45 AM

## 2021-09-13 NOTE — Evaluation (Signed)
Physical Therapy Assessment and Plan ? ?Patient Details  ?Name: Kayla Maynard ?MRN: [id number] ?Date of Birth: 1955-10-28 ? ?PT Diagnosis: Abnormal posture, Abnormality of gait, Cognitive deficits, Difficulty walking, and Pain in head ?Rehab Potential: Good ?ELOS: 12-16 days  ? ?Today's Date: 09/13/2021 ?PT Individual Time: 1300-1400 ?PT Individual Time Calculation (min): 60 min   ? ?Hospital Problem: Principal Problem: ?  ICH (intracerebral hemorrhage) (HCC) ? ? ?Past Medical History:  ?Past Medical History:  ?Diagnosis Date  ? Angina at rest [facility])   ? ?Past Surgical History:  ?Past Surgical History:  ?Procedure Laterality Date  ? CRANIOTOMY Left 08/27/2021  ? Procedure: CRANIOTOMY FOR EVACUATION OF SUBDURAL HEMATOMA WITH PLACEMENT OF BONE FLAP IN ABDOMEN;  Surgeon: Donalee Citrin, MD;  Location: [facility] OR;  Service: Neurosurgery;  Laterality: Left;  ? IR ANGIO INTRA EXTRACRAN SEL COM CAROTID INNOMINATE UNI R MOD SED  08/27/2021  ? IR ANGIO INTRA EXTRACRAN SEL INTERNAL CAROTID UNI L MOD SED  08/30/2021  ? IR ANGIO VERTEBRAL SEL SUBCLAVIAN INNOMINATE UNI R MOD SED  08/30/2021  ? IR ANGIOGRAM FOLLOW UP STUDY  08/27/2021  ? IR ANGIOGRAM FOLLOW UP STUDY  08/27/2021  ? IR ANGIOGRAM FOLLOW UP STUDY  08/27/2021  ? IR ANGIOGRAM FOLLOW UP STUDY  08/27/2021  ? IR ANGIOGRAM FOLLOW UP STUDY  08/27/2021  ? IR ANGIOGRAM FOLLOW UP STUDY  08/27/2021  ? IR ANGIOGRAM FOLLOW UP STUDY  08/27/2021  ? IR CT HEAD LTD  08/27/2021  ? IR NEURO EACH ADD'L AFTER BASIC UNI LEFT (MS)  08/30/2021  ? IR TRANSCATH/EMBOLIZ  08/27/2021  ? RADIOLOGY WITH ANESTHESIA N/A 08/27/2021  ? Procedure: RADIOLOGY WITH ANESTHESIA;  Surgeon: Julieanne Cotton, MD;  Location: [facility] OR;  Service: Radiology;  Laterality: N/A;  ? STENT PLACEMENT VASCULAR ([facility] HX)    ? ? ?Assessment & Plan ?Clinical Impression: Patient is a 66 y.o. year old female who was in her usual state of health on 08/27/2020 when she was home alone, felt dizzy and lightheaded associated with nausea and 1 episode of emesis.   The patient called her sister and upon her arrival, found the patient unresponsive.  She dialed 911 and adives to begin CPR.  Family member described seizure type activity.  EMS was called and she was transported to [facility] emergency department.  She was found to be hypoglycemic and given D10.  The patient has no history of diabetes mellitus.  On arrival, the patient was able to recount what had occurred and endorsed neck pain and headache for 2 weeks.  Seizure activity was witnessed in the emergency department.  CT scan of the head revealed extensive subarachnoid hemorrhage at the basilar cisterns, interhemispheric fissure, bilateral sylvian fissures greater on the left.  A large subdural hematoma of the left frontal, parietal and temporal regions also noted.  Marked mass effect upon the left hemisphere.  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  The patient was transferred to [facility] and critical care medicine and neurosurgery consulted.  She required intubation prior to transfer.  3% of IV saline and clevidipine initiated.  Neurology also consulted.  Nimodipine started 4/29 to prevent vasospasm.  Dr. Wynetta Emery recommended angiography with interventional radiology and possible embolization prior to craniotomy for evacuation of the subdural.  On 4/29, she underwent arteriogram by Dr. Corliss Skains with endovascular obliteration of left posterior communicating artery aneurysm with coiling.  Afterward, she was taken to the operating room where she underwent left-sided pterional craniotomy for evacuation of left-sided subdural hematoma with  implantation of the bone flap in the left abdominal wall. Intracranial drain removed 5/1. Continued on Keppra, Tube feeds started via Cortrak. Cleared for pharmacologic DVT prophylaxis.  Extubated on 5/2. Mild agitation requiring restraints. Hemdynamically stable. The patient requires inpatient medicine and rehabilitation evaluations and services for ongoing dysfunction  secondary to nontraumatic subarachnoid bleed from left posterior communicating artery, subdural hematoma, status post craniectomy. Patient transferred to CIR on 09/12/2021 .  ? ?Patient currently requires min with mobility secondary to decreased cardiorespiratoy endurance, decreased visual motor skills, decreased attention, decreased awareness, decreased problem solving, decreased safety awareness, decreased memory, and delayed processing, and decreased sitting balance, decreased standing balance, decreased postural control, decreased balance strategies, and difficulty maintaining precautions.  Prior to hospitalization, patient was independent  with mobility and lived with Alone in a House home.  Home access is 4Stairs to enter. ? ?Patient will benefit from skilled PT intervention to maximize safe functional mobility, minimize fall risk, and decrease caregiver burden for planned discharge home with 24 hour supervision.  Anticipate patient will benefit from follow up OP at discharge. ? ?PT - End of Session ?Activity Tolerance: Tolerates 10 - 20 min activity with multiple rests ?Endurance Deficit: Yes ?Endurance Deficit Description: Requires frequent sitting rest breaks and limited by fatigue and decreased arousal with >20 min of activity ?PT Assessment ?Rehab Potential (ACUTE/IP ONLY): Good ?PT Barriers to Discharge: Home environment access/layout;Incontinence;Behavior;Nutrition means ?PT Patient demonstrates impairments in the following area(s): Balance;Perception;Safety;Behavior;Edema;Sensory;Skin Integrity;Endurance;Motor;Nutrition;Pain ?PT Transfers Functional Problem(s): Bed Mobility;Bed to Chair;Car;Furniture ?PT Locomotion Functional Problem(s): Ambulation;Wheelchair Mobility;Stairs ?PT Plan ?PT Intensity: Minimum of 1-2 x/day ,45 to 90 minutes ?PT Frequency: 5 out of 7 days ?PT Duration Estimated Length of Stay: 12-16 days ?PT Treatment/Interventions: Ambulation/gait training;Cognitive  remediation/compensation;Discharge planning;DME/adaptive equipment instruction;Functional mobility training;Pain management;Splinting/orthotics;Psychosocial support;Therapeutic Activities;UE/LE Strength taining/ROM;Visual/perceptual remediation/compensation;Wheelchair propulsion/positioning;UE/LE Coordination activities;Therapeutic Exercise;Stair training;Skin care/wound management;Patient/family education;Neuromuscular re-education;Functional electrical stimulation;Disease management/prevention;Community reintegration;Balance/vestibular training ?PT Transfers Anticipated Outcome(s): supervision using LRAD ?PT Locomotion Anticipated Outcome(s): supervision using LRAD >250 feet ?PT Recommendation ?Recommendations for Other Services: Neuropsych consult ?Follow Up Recommendations: Outpatient PT ?Patient destination: Home ?Equipment Recommended: To be determined ? ? ?PT Evaluation ?Precautions/Restrictions ?Precautions ?Precautions: Fall ?Precaution Comments: L crani, bone flap in L abdominal wall ?Restrictions ?Weight Bearing Restrictions: No ?Pain Interference ?Pain Interference ?Pain Effect on Sleep: 8. Unable to answer ?Pain Interference with Therapy Activities: 8. Unable to answer ?Pain Interference with Day-to-Day Activities: 8. Unable to answer ?Home Living/Prior Functioning ?Home Living ?Available Help at Discharge: Family;Available 24 hours/day ?Type of Home: House ?Home Access: Stairs to enter ?Entrance Stairs-Number of Steps: 4 ?Entrance Stairs-Rails: None ?Home Layout: Two level ?Alternate Level Stairs-Number of Steps: 3 ?Alternate Level Stairs-Rails: Right ?Bathroom Shower/Tub: Tub/shower unit ?Additional Comments: good family support, on disability for back pain ? Lives With: Alone ?Prior Function ?Level of Independence: Independent with gait;Independent with basic ADLs;Independent with transfers;Independent with homemaking with ambulation ? Able to Take Stairs?: Yes ?Driving: Yes ?Vocation:  Retired ?Vision/Perception  ?Vision - History ?Ability to See in Adequate Light: 0 Adequate ?Perception ?Perception: Impaired ?Praxis ?Praxis: Impaired ?Praxis Impairment Details: Perseveration;Ideomotor  ?Cognition ?Overall Cognitive Status:

## 2021-09-13 NOTE — Progress Notes (Signed)
Patient ID: Kayla Maynard, female   DOB: 1955/05/09, 66 y.o.   MRN: [id number] ? ?SW made efforts to complete assessment with pt, however, unable to complete due to cognitive deficits.  ? ?15- SW spoke with pt son Ortencia Kick to introduce self, explain role, discuss discharge process, and provide updates from team conference in which ELOS 2-3 weeks. SW shared will follow-up after updates from team conference. SW encouraged he and his wife to discuss applying for guardianship for pt as he reports his mom has been very private with his finances. SW explained pt would notne able to complete HCPOA due to cognitive deficits. He reports pt will not have 24/7 care at discharge as he and his wife both work, and he cares for his 61 yr old grandson which they have custody. States he has a brother and sister and neither have volunteered to help assist at this point. SW encouraged follow-up if needed.  ? ?Cecile Sheerer, MSW, LCSWA ?Office: [phone] ?Cell: [phone] ?Fax: [phone]  ?

## 2021-09-13 NOTE — Evaluation (Addendum)
Occupational Therapy Assessment and Plan  Patient Details  Name: Kayla Maynard MRN: [id number] Date of Birth: 09/27/1955  OT Diagnosis: abnormal posture, acute pain, apraxia, cognitive deficits, and muscle weakness (generalized) Rehab Potential: Rehab Potential (ACUTE ONLY): Good ELOS: 12-16 days   Today's Date: 09/13/2021 OT Individual Time: 3846-6599 OT Individual Time Calculation (min): 75 min     Hospital Problem: Principal Problem:   ICH (intracerebral hemorrhage) ([place])   Past Medical History:  Past Medical History:  Diagnosis Date   Angina at rest [facility])    Past Surgical History:  Past Surgical History:  Procedure Laterality Date   CRANIOTOMY Left 08/27/2021   Procedure: CRANIOTOMY FOR EVACUATION OF SUBDURAL HEMATOMA WITH PLACEMENT OF BONE FLAP IN ABDOMEN;  Surgeon: Kary Kos, MD;  Location: [place];  Service: Neurosurgery;  Laterality: Left;   IR ANGIO INTRA EXTRACRAN SEL COM CAROTID INNOMINATE UNI R MOD SED  08/27/2021   IR ANGIO INTRA EXTRACRAN SEL INTERNAL CAROTID UNI L MOD SED  08/30/2021   IR ANGIO VERTEBRAL SEL SUBCLAVIAN INNOMINATE UNI R MOD SED  08/30/2021   IR ANGIOGRAM FOLLOW UP STUDY  08/27/2021   IR ANGIOGRAM FOLLOW UP STUDY  08/27/2021   IR ANGIOGRAM FOLLOW UP STUDY  08/27/2021   IR ANGIOGRAM FOLLOW UP STUDY  08/27/2021   IR ANGIOGRAM FOLLOW UP STUDY  08/27/2021   IR ANGIOGRAM FOLLOW UP STUDY  08/27/2021   IR ANGIOGRAM FOLLOW UP STUDY  08/27/2021   IR CT HEAD LTD  08/27/2021   IR NEURO EACH ADD'L AFTER BASIC UNI LEFT (MS)  08/30/2021   IR TRANSCATH/EMBOLIZ  08/27/2021   RADIOLOGY WITH ANESTHESIA N/A 08/27/2021   Procedure: RADIOLOGY WITH ANESTHESIA;  Surgeon: Luanne Bras, MD;  Location: [place];  Service: Radiology;  Laterality: N/A;   STENT PLACEMENT VASCULAR ([facility] HX)      Assessment & Plan Clinical Impression: Kayla Maynard is a 66 y/o woman who presented with LOC and was found to have seizures x3 in the ED. CT showed extensive subarachnoid hemorrhage bilaterally  and large SDH L frontal/ parietal/ temporal. 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  PMH: angina, back surgery   Patient currently requires min- max with basic self-care skills secondary to muscle weakness, decreased cardiorespiratoy endurance, impaired timing and sequencing, unbalanced muscle activation, and motor apraxia, decreased visual perceptual skills, ideational apraxia, decreased initiation, decreased attention, decreased awareness, decreased problem solving, decreased safety awareness, decreased memory, and delayed processing, and decreased sitting balance, decreased standing balance, decreased postural control, and decreased balance strategies.  Prior to hospitalization, patient could complete BADL/IADL with independent .  Patient will benefit from skilled intervention to decrease level of assist with basic self-care skills and increase independence with basic self-care skills prior to discharge home with care partner.  Anticipate patient will require 24 hour supervision and follow up home health.  OT - End of Session Endurance Deficit: Yes OT Assessment Rehab Potential (ACUTE ONLY): Good OT Patient demonstrates impairments in the following area(s): Balance;Cognition;Endurance;Motor;Safety;Pain;Vision;Skin Integrity;Perception OT Basic ADL's Functional Problem(s): Grooming;Bathing;Dressing;Toileting OT Transfers Functional Problem(s): Toilet;Tub/Shower OT Plan OT Intensity: Minimum of 1-2 x/day, 45 to 90 minutes OT Frequency: 5 out of 7 days OT Duration/Estimated Length of Stay: 12-16 days OT Treatment/Interventions: Balance/vestibular training;Discharge planning;Functional electrical stimulation;Pain management;Therapeutic Activities;Self Care/advanced ADL retraining;UE/LE Coordination activities;Visual/perceptual remediation/compensation;Therapeutic Exercise;Skin care/wound managment;Patient/family education;Functional mobility training;Disease  mangement/prevention;Cognitive remediation/compensation;Community reintegration;DME/adaptive equipment instruction;Neuromuscular re-education;Psychosocial support;Splinting/orthotics;UE/LE Strength taining/ROM;Wheelchair propulsion/positioning OT Self Feeding Anticipated Outcome(s): S OT Basic Self-Care Anticipated Outcome(s): S OT Toileting Anticipated Outcome(s): S  OT Bathroom Transfers Anticipated Outcome(s): S OT Recommendation Follow Up Recommendations: Home health OT;Outpatient OT OT destination: home Equipment Recommended: 3 in 1 bedside comode;Tub/shower bench;To be determined   OT Evaluation Precautions/Restrictions  Precautions Precautions: Fall Precaution Comments: L crani, bone flap in L abdominal wall Restrictions Weight Bearing Restrictions: No General Chart Reviewed: Yes Family/Caregiver Present: Yes Vital Signs Therapy Vitals Pulse Rate: 84 BP: 112/60 Patient Position (if appropriate): Lying Oxygen Therapy SpO2: 100 % O2 Device: Room Air SpO2 Alarm Limit Low: 99 Pain Pain Assessment Faces Pain Scale: Hurts little more Pain Type: Acute pain Home Living/Prior Functioning Home Living Family/patient expects to be discharged to:: Private residence Living Arrangements: Children Available Help at Discharge: Family, Available 24 hours/day Type of Home: House Home Access: Stairs to enter Entrance Stairs-Rails: None Home Layout: Two level Alternate Level Stairs-Number of Steps: 3 Alternate Level Stairs-Rails: Right Bathroom Shower/Tub: Tub/shower unit Additional Comments: good family support, on disability for back pain  Lives With: Alone Vision Baseline Vision/History: 0 No visual deficits Ability to See in Adequate Light: 0 Adequate Vision Assessment?: Vision impaired- to be further tested in functional context Perception  Perception: Impaired Praxis Praxis: Impaired Praxis Impairment Details: Perseveration;Ideomotor Cognition Cognition Overall  Cognitive Status: Impaired/Different from baseline Arousal/Alertness: Awake/alert Orientation Level: Person Memory: Impaired Brief Interview for Mental Status (BIMS) Repetition of Three Words (First Attempt): 3 Temporal Orientation: Year: No answer Temporal Orientation: Month: No answer Temporal Orientation: Day: No answer Recall: "Sock": No, could not recall Recall: "Blue": No, could not recall Recall: "Bed": No, could not recall BIMS Summary Score: 3 Sensation Sensation Light Touch: Appears Intact Coordination Gross Motor Movements are Fluid and Coordinated: Yes Fine Motor Movements are Fluid and Coordinated: No Finger Nose Finger Test: minimally slower R Motor    Abnormal postural alignment Motor apraxia Trunk/Postural Assessment  Cervical Assessment Cervical Assessment: Within Functional Limits Thoracic Assessment Thoracic Assessment: Within Functional Limits Lumbar Assessment Lumbar Assessment: Exceptions to [facility] (post tilt) Postural Control Postural Control: Deficits on evaluation (delayed)  Balance Balance Balance Assessed: Yes Dynamic Sitting Balance Dynamic Sitting - Level of Assistance: 5: Stand by assistance Static Standing Balance Static Standing - Level of Assistance: 5: Stand by assistance;4: Min assist Dynamic Standing Balance Dynamic Standing - Level of Assistance: 4: Min assist;3: Mod assist Extremity/Trunk Assessment RUE Assessment RUE Assessment: Exceptions to [facility] General Strength Comments: mild decreased coordination LUE Assessment LUE Assessment: Within Functional Limits  Care Tool Care Tool Self Care Eating        Oral Care    Oral Care Assist Level: Minimal Assistance - Patient > 75%    Bathing   Body parts bathed by patient: Right arm;Left arm;Chest;Abdomen;Front perineal area;Buttocks;Right upper leg;Left upper leg;Face Body parts bathed by helper: Left lower leg;Right lower leg   Assist Level: Moderate Assistance - Patient 50 -  74%    Upper Body Dressing(including orthotics)   What is the patient wearing?: Pull over shirt   Assist Level: Minimal Assistance - Patient > 75%    Lower Body Dressing (excluding footwear)   What is the patient wearing?: Pants;Underwear/pull up Assist for lower body dressing: Maximal Assistance - Patient 25 - 49%    Putting on/Taking off footwear   What is the patient wearing?: Non-skid slipper socks;Ted hose Assist for footwear: Dependent - Patient 0%       Care Tool Toileting Toileting activity   Assist for toileting: Minimal Assistance - Patient > 75%     Care Tool Bed Mobility Roll left and  right activity        Sit to lying activity        Lying to sitting on side of bed activity         Care Tool Transfers Sit to stand transfer   Sit to stand assist level: Minimal Assistance - Patient > 75%    Chair/bed transfer   Chair/bed transfer assist level: Minimal Assistance - Patient > 75%     Toilet transfer   Assist Level: Minimal Assistance - Patient > 75%     Care Tool Cognition  Expression of Ideas and Wants    Understanding Verbal and Non-Verbal Content     Memory/Recall Ability     Refer to Care Plan for Long Term Goals  SHORT TERM GOAL WEEK 1 OT Short Term Goal 1 (Week 1): Pt will groom at sink wiht no more than min cuing for seqencing OT Short Term Goal 2 (Week 1): Pt will complete toilet transfers with CGA consistently OT Short Term Goal 3 (Week 1): Pt will orient clothing with no more than min cuing OT Short Term Goal 4 (Week 1): Pt will thread BLE into pants to dmeo improved praxis  Recommendations for other services: None    Skilled Therapeutic Intervention 1:1. Pt received in bed awake and agreeable to OT after edu re OT role/purpose, CIR, ELOS and CVA recovery. Pt moderately impulsive and restless throughout session with mobility and does not verbalize wants unless OT asks (Ie. Restless because pt needed to go to the bathroom). Pt able ot  completes toileting and UB Adls with MIN A, but requires MOD A for sequencing bathing and grooming tasks at sink. Pt demo Ideomotor apraxia by putting washcloth in mouth and brushing toothpaste off of toothbrush with hand after applying herself. Pt requires cuing for orientation of clothing and MAX A overall to don LB clothing. Exited session with pt seated in bed, exit alarm on and call light in reach   ADL   MIN A UB MAX A LB MIN A functional transfers with HHA Mobility  Transfers Sit to Stand: Minimal Assistance - Patient > 75% Stand to Sit: Minimal Assistance - Patient > 75%   Discharge Criteria: Patient will be discharged from OT if patient refuses treatment 3 consecutive times without medical reason, if treatment goals not met, if there is a change in medical status, if patient makes no progress towards goals or if patient is discharged from hospital.  The above assessment, treatment plan, treatment alternatives and goals were discussed and mutually agreed upon: by patient  Tonny Branch 09/13/2021, 9:02 AM

## 2021-09-13 NOTE — Patient Care Conference (Signed)
Inpatient RehabilitationTeam Conference and Plan of Care Update ?Date: 09/13/2021   Time: 10:18 AM  ? ? ?Patient Name: Kayla Maynard      ?Medical Record Number: [id number]  ?Date of Birth: 23-Mar-1956 ?Sex: Female         ?Room/Bed: 4W16C/4W16C-01 ?Payor Info: Payor: MEDICARE / Plan: MEDICARE PART A AND B / Product Type: *No Product type* /   ? ?Admit Date/Time:  09/12/2021  2:29 PM ? ?Primary Diagnosis:  ICH (intracerebral hemorrhage) ([place]) ? ?Hospital Problems: Principal Problem: ?  ICH (intracerebral hemorrhage) ([place]) ? ? ? ?Expected Discharge Date: Expected Discharge Date:  (2-3 Weeks) ? ?Team Members Present: ?Physician leading conference: Dr. Alger Simons ?Social Worker Present: Loralee Pacas, LCSWA ?Nurse Present: Dorthula Nettles, RN ?PT Present: Apolinar Junes, PT ?OT Present: Mariane Masters, OT ?SLP Present: Weston Anna, SLP ?PPS Coordinator present : Gunnar Fusi, SLP ? ?   Current Status/Progress Goal Weekly Team Focus  ?Bowel/Bladder ? ? incont of b/b LBM 5/16  regain continence  assess q shift and prn   ?Swallow/Nutrition/ Hydration ? ? Eval Pending         ?ADL's ? ? MIN A mobility; MAX A LB ADLS, MIN A UB ADLspraxis, slow processing, poor spatial awareness, impulsivity  supervision  balance, cognition, awareness, funcitonal mobility, ADL retraining   ?Mobility ? ? Eval Pending         ?Communication ? ? Eval Pending         ?Safety/Cognition/ Behavioral Observations ? Eval Pending         ?Pain ? ? no current complaints of pain  remain pain free  assess q shift and prn   ?Skin ? ? surgical insisions masd to bottom  no new breakdown  assess q shift and prn   ? ? ?Discharge Planning:  ?To be assessed. Per EMR, pt lives alone but will have support from family. SW will confirm no barriers to discharge.   ?Team Discussion: ?Crani with coiling. Word finding difficulties. Apraxic, aphasic. Work towards discontinuing tube feeds. Maintain restraints. Has good support. Incontinent B/B,  experiencing headaches. Head and abdominal incisions. SW eval pending. Cortrac in place, impulsive. ? ?Patient on target to meet rehab goals: ?Evals pending for PT and SLP. Min assist upper body. Poor sequencing. Max assist lower body. Min assist transfers/toileting. ? ?*See Care Plan and progress notes for long and short-term goals.  ? ?Revisions to Treatment Plan:  ?Adjusting medications. ?  ?Teaching Needs: ?Family education, medication/pain management, bowel/bladder management, skin/wound care, transfer/gait training, etc. ?  ?Current Barriers to Discharge: ?Incontinence, Wound care, Lack of/limited family support, Behavior, and Nutritional means ? ?Possible Resolutions to Barriers: ?Family education ?Timed toileting schedule ?Discontinue tube feeds ?Follow-up [facility] or outpatient therapy ?Order recommended DME ?  ? ? Medical Summary ?Current Status: left p-comm aneurysm with hemorrhage. s/p craniectomy and coiling, dysphagia, aphasia ? Barriers to Discharge: Medical stability ?  ?Possible Resolutions to [employer]: daily assessment of wounds, pt data and labs ? ? ?Continued Need for Acute Rehabilitation Level of Care: The patient requires daily medical management by a physician with specialized training in physical medicine and rehabilitation for the following reasons: ?Direction of a multidisciplinary physical rehabilitation program to maximize functional independence : Yes ?Medical management of patient stability for increased activity during participation in an intensive rehabilitation regime.: Yes ?Analysis of laboratory values and/or radiology reports with any subsequent need for medication adjustment and/or medical intervention. : Yes ? ? ?I attest that I was present, lead the  team conference, and concur with the assessment and plan of the team. ? ? ?Dorthula Nettles G ?09/13/2021, 1:44 PM  ? ? ? ? ? ? ?

## 2021-09-13 NOTE — Evaluation (Signed)
Speech Language Pathology Assessment and Plan ? ?Patient Details  ?Name: Kayla Maynard ?MRN: [id number] ?Date of Birth: 27-Mar-1956 ? ?SLP Diagnosis: Speech and Language deficits;Cognitive Impairments;Dysphagia  ?Rehab Potential: Good ?ELOS: 12-16 days  ? ? ?Today's Date: 09/13/2021 ?SLP Individual Time: 1405-1500 ?SLP Individual Time Calculation (min): 55 min ? ? ?Hospital Problem: Principal Problem: ?  ICH (intracerebral hemorrhage) ([place]) ? ?Past Medical History:  ?Past Medical History:  ?Diagnosis Date  ? Angina at rest [facility])   ? ?Past Surgical History:  ?Past Surgical History:  ?Procedure Laterality Date  ? CRANIOTOMY Left 08/27/2021  ? Procedure: CRANIOTOMY FOR EVACUATION OF SUBDURAL HEMATOMA WITH PLACEMENT OF BONE FLAP IN ABDOMEN;  Surgeon: Kary Kos, MD;  Location: [place];  Service: Neurosurgery;  Laterality: Left;  ? IR ANGIO INTRA EXTRACRAN SEL COM CAROTID INNOMINATE UNI R MOD SED  08/27/2021  ? IR ANGIO INTRA EXTRACRAN SEL INTERNAL CAROTID UNI L MOD SED  08/30/2021  ? IR ANGIO VERTEBRAL SEL SUBCLAVIAN INNOMINATE UNI R MOD SED  08/30/2021  ? IR ANGIOGRAM FOLLOW UP STUDY  08/27/2021  ? IR ANGIOGRAM FOLLOW UP STUDY  08/27/2021  ? IR ANGIOGRAM FOLLOW UP STUDY  08/27/2021  ? IR ANGIOGRAM FOLLOW UP STUDY  08/27/2021  ? IR ANGIOGRAM FOLLOW UP STUDY  08/27/2021  ? IR ANGIOGRAM FOLLOW UP STUDY  08/27/2021  ? IR ANGIOGRAM FOLLOW UP STUDY  08/27/2021  ? IR CT HEAD LTD  08/27/2021  ? IR NEURO EACH ADD'L AFTER BASIC UNI LEFT (MS)  08/30/2021  ? IR TRANSCATH/EMBOLIZ  08/27/2021  ? RADIOLOGY WITH ANESTHESIA N/A 08/27/2021  ? Procedure: RADIOLOGY WITH ANESTHESIA;  Surgeon: Luanne Bras, MD;  Location: [place];  Service: Radiology;  Laterality: N/A;  ? STENT PLACEMENT VASCULAR ([place] HX)    ? ? ?Assessment / Plan / Recommendation ?Clinical Impression Patient is a 66 yo F with PMH of angina presented to [facility] 4/29 after loss of consciousness at home. It was felt that pt likely had a seizure. Endorsed HA and neck pain, worse x 2 wk. Had 2  additional sz in ED. Intubated for airway protection. Found to have ICH -- SAH, SDH. Pt. transferred [facility] to [facility] for further care. Imaging on 4/29 revealed approximately 7.3 mm x 3.7 mm x 5 mm irregular left posterior communicating artery aneurysm.  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 Patient also underwent left-sided pterional craniotomy for evacuation of left-sided subdural hematoma with implantation of the bone flap in the left abdominal wall. Scans on 4/30 revealed considerable improvement postintervention with near complete resolution of midline shift. Intracranial drain removed 5/1 and patient was extubated 5/2. Therapy evaluations complete with recommendations for CIR. Patient admitted 09/12/21. ? ?Upon arrival, patient was asleep and required extra time with Max A multimodal cues for arousal. Patient remained lethargic throughout session with decreased frustration tolerance and restlessness noted. Due to lethargy, patient with decreased initiation with all functional tasks and required more than a reasonable amount of time for processing. Patient required total A for orientation, intellectual awareness, sustained attention and functional problem solving. Patient with minimal verbal output making it difficult to truly assess language function but patient did attempt to answer basic questions appropriately and named functional items with 60% accuracy. Ongoing assessment is needed when patient is more awake and alert. Patient participated in minimal trials. Patient consumed sips of thin liquids via straw with overt coughing noted, suspect due to large sequential sips and more attention to task. Coughing was eliminated  with small sips. Patient also demonstrated mildly prolonged mastication and mild oral residue with Dys. 2 textures. Patient grimaced during swallowing and family reports baseline history of swallowing deficits which appeared  esophageal in nature. Due to patient's current cognitive deficits and lethargy, recommend patient downgrade to Dys. 2 textures with thin liquids with full supervision. Patient's family in agreement. Patient would benefit from skilled SLP intervention to maximize her cognitive-linguistic and swallowing function prior to discharge.  ?  ?Skilled Therapeutic Interventions          Administered a cognitive-linguistic evaluation and BSE, please see above for details.   ?SLP Assessment ? Patient will need skilled Speech Lanaguage Pathology Services during CIR admission  ?  ?Recommendations ? SLP Diet Recommendations: Dysphagia 2 (Fine chop);Thin ?Liquid Administration via: Cup;Straw ?Medication Administration: Crushed with puree ?Supervision: Patient able to self feed;Staff to assist with self feeding;Full supervision/cueing for compensatory strategies ?Compensations: Slow rate;Small sips/bites;Follow solids with liquid;Minimize environmental distractions ?Postural Changes and/or Swallow Maneuvers: Seated upright 90 degrees;Upright 30-60 min after meal ?Oral Care Recommendations: Oral care BID ?Recommendations for Other Services: Neuropsych consult ?Patient destination: Home ?Follow up Recommendations: Home Health SLP;Outpatient SLP;24 hour supervision/assistance ?Equipment Recommended: None recommended by SLP  ?  ?SLP Frequency 3 to 5 out of 7 days   ?SLP Duration ? ?SLP Intensity ? ?SLP Treatment/Interventions 12-16 days ? ?Minumum of 1-2 x/day, 30 to 90 minutes ? ?Cognitive remediation/compensation;Dysphagia/aspiration precaution training;Internal/external aids;Speech/Language facilitation;Cueing hierarchy;Environmental controls;Therapeutic Activities;Functional tasks;Patient/family education   ? ?Pain ?No/Denies Pain  ? ?Prior Functioning ?Type of Home: House ? Lives With: Alone ?Available Help at Discharge: Family;Available 24 hours/day ?Vocation: Retired ? ?SLP Evaluation ?Cognition ?Overall Cognitive Status:  Impaired/Different from baseline ?Arousal/Alertness: Lethargic ?Orientation Level: Oriented to person;Disoriented to place;Disoriented to time;Disoriented to situation ?Attention: Sustained ?Sustained Attention: Impaired ?Sustained Attention Impairment: Verbal basic;Functional basic ?Memory: Impaired ?Memory Impairment: Storage deficit;Retrieval deficit;Decreased recall of new information ?Awareness: Impaired ?Awareness Impairment: Intellectual impairment ?Problem Solving: Impaired ?Problem Solving Impairment: Verbal basic;Functional basic ?Behaviors: Restless;Impulsive;Poor frustration tolerance ?Safety/Judgment: Impaired  ?Comprehension ?Auditory Comprehension ?Overall Auditory Comprehension: Appears within functional limits for tasks assessed ?Expression ?Expression ?Primary Mode of Expression: Verbal ?Verbal Expression ?Overall Verbal Expression: Impaired ?Initiation: Impaired ?Automatic Speech: Name ?Level of Generative/Spontaneous Verbalization: Word ?Repetition: No impairment ?Naming: Impairment ?Pragmatics: Impairment ?Impairments: Abnormal affect;Monotone ?Non-Verbal Means of Communication: Not applicable ?Written Expression ?Dominant Hand: Right ?Oral Motor ?Oral Motor/Sensory Function ?Overall Oral Motor/Sensory Function: Generalized oral weakness ?Motor Speech ?Overall Motor Speech: Impaired ?Respiration: Within functional limits ?Phonation: Normal ?Resonance: Within functional limits ?Articulation: Impaired ?Level of Impairment: Word ?Intelligibility: Intelligible ?Conversation: 75-100% accurate ?Motor Planning: Witnin functional limits ? ?Care Tool ?Care Tool Cognition ?Ability to hear (with hearing aid or hearing appliances if normally used Ability to hear (with hearing aid or hearing appliances if normally used): 0. Adequate - no difficulty in normal conservation, social interaction, listening to TV ?  ?Expression of Ideas and Wants Expression of Ideas and Wants: 2. Frequent difficulty - frequently  exhibits difficulty with expressing needs and ideas ?  ?Understanding Verbal and Non-Verbal Content Understanding Verbal and Non-Verbal Content: 2. Sometimes understands - understands only basic conversations or simple,

## 2021-09-14 LAB — GLUCOSE, CAPILLARY
Glucose-Capillary: 116 mg/dL — ABNORMAL HIGH (ref 70–99)
Glucose-Capillary: 122 mg/dL — ABNORMAL HIGH (ref 70–99)
Glucose-Capillary: 125 mg/dL — ABNORMAL HIGH (ref 70–99)
Glucose-Capillary: 144 mg/dL — ABNORMAL HIGH (ref 70–99)
Glucose-Capillary: 145 mg/dL — ABNORMAL HIGH (ref 70–99)
Glucose-Capillary: 155 mg/dL — ABNORMAL HIGH (ref 70–99)
Glucose-Capillary: 84 mg/dL (ref 70–99)
Glucose-Capillary: 88 mg/dL (ref 70–99)

## 2021-09-14 LAB — BASIC METABOLIC PANEL WITH GFR
Anion gap: 7 (ref 5–15)
BUN: 64 mg/dL — ABNORMAL HIGH (ref 8–23)
CO2: 16 mmol/L — ABNORMAL LOW (ref 22–32)
Calcium: 9.7 mg/dL (ref 8.9–10.3)
Chloride: 113 mmol/L — ABNORMAL HIGH (ref 98–111)
Creatinine, Ser: 0.81 mg/dL (ref 0.44–1.00)
GFR, Estimated: >60 mL/min
Glucose, Bld: 146 mg/dL — ABNORMAL HIGH (ref 70–99)
Potassium: 5.6 mmol/L — ABNORMAL HIGH (ref 3.5–5.1)
Sodium: 136 mmol/L (ref 135–145)

## 2021-09-14 NOTE — Plan of Care (Signed)
?  Problem: Safety: ?Goal: Non-violent Restraint(s) ?Outcome: Progressing ?  ?Problem: Consults ?Goal: RH BRAIN INJURY PATIENT EDUCATION ?Description: Description: See Patient Education module for eduction specifics ?Outcome: Progressing ?Goal: Skin Care Protocol Initiated - if Braden Score 18 or less ?Description: If consults are not indicated, leave blank or document N/A ?Outcome: Progressing ?Goal: Nutrition Consult-if indicated ?Outcome: Progressing ?  ?Problem: RH BOWEL ELIMINATION ?Goal: RH STG MANAGE BOWEL WITH ASSISTANCE ?Description: STG Manage Bowel with [place]. ?Outcome: Progressing ?Goal: RH STG MANAGE BOWEL W/MEDICATION W/ASSISTANCE ?Description: STG Manage Bowel with Medication with [place]. ?Outcome: Progressing ?  ?Problem: RH BLADDER ELIMINATION ?Goal: RH STG MANAGE BLADDER WITH ASSISTANCE ?Description: STG Manage Bladder With Min Assistance ?Outcome: Progressing ?Goal: RH STG MANAGE BLADDER WITH MEDICATION WITH ASSISTANCE ?Description: STG Manage Bladder With Medication With [employer]. ?Outcome: Progressing ?  ?Problem: RH SKIN INTEGRITY ?Goal: RH STG MAINTAIN SKIN INTEGRITY WITH ASSISTANCE ?Description: STG Maintain Skin Integrity With [employer]. ?Outcome: Progressing ?Goal: RH STG ABLE TO PERFORM INCISION/WOUND CARE W/ASSISTANCE ?Description: STG Able To Perform Incision/Wound Care With [facility]. ?Outcome: Progressing ?  ?Problem: RH SAFETY ?Goal: RH STG ADHERE TO SAFETY PRECAUTIONS W/ASSISTANCE/DEVICE ?Description: STG Adhere to Safety Precautions With Cues and Reminders. ?Outcome: Progressing ?Goal: RH STG DECREASED RISK OF FALL WITH ASSISTANCE ?Description: STG Decreased Risk of Fall With [employer]. ?Outcome: Progressing ?  ?Problem: RH COGNITION-NURSING ?Goal: RH STG USES MEMORY AIDS/STRATEGIES W/ASSIST TO PROBLEM SOLVE ?Description: STG Uses Memory Aids/Strategies With Min Assistance to Problem Solve. ?Outcome: Progressing ?Goal: RH STG ANTICIPATES  NEEDS/CALLS FOR ASSIST W/ASSIST/CUES ?Description: STG Anticipates Needs/Calls for Assist With Cues and Reminders. ?Outcome: Progressing ?  ?Problem: RH PAIN MANAGEMENT ?Goal: RH STG PAIN MANAGED AT OR BELOW PT'S PAIN GOAL ?Description: < 3 on a 0-10 pain scale. ?Outcome: Progressing ?  ?Problem: RH KNOWLEDGE DEFICIT BRAIN INJURY ?Goal: RH STG INCREASE KNOWLEDGE OF SELF CARE AFTER BRAIN INJURY ?Description: Patient will demonstrate knowledge of self-care management, medication/pain management, skin/wound care, dysphagia/thickened liquids management with educational materials and handouts provided by staff independently at discharge. ?Outcome: Progressing ?Goal: RH STG INCREASE KNOWLEDGE OF DYSPHAGIA/FLUID INTAKE ?Description: Patient will demonstrate knowledge of dysphagia diets and thickened liquids with educational materials, handouts, and demonstration provided by staff independently at discharge. ?Outcome: Progressing ?  ?Problem: Consults ?Goal: RH STROKE PATIENT EDUCATION ?Description: See Patient Education module for education specifics  ?Outcome: Progressing ?  ?

## 2021-09-14 NOTE — Progress Notes (Signed)
Nurse spent about 30 minutes discussing need for restraints with son.  Explained patient becomes more confused at nighttime pulling at tubes and soiled brief.  After discussion son was more understanding of the need for restraints.  Patient was changed, repositioned, and placed back in restraints before son left.   ?

## 2021-09-14 NOTE — Plan of Care (Signed)
?  Problem: Safety: ?Goal: Non-violent Restraint(s) ?Outcome: Progressing ?  ?Problem: Consults ?Goal: RH BRAIN INJURY PATIENT EDUCATION ?Description: Description: See Patient Education module for eduction specifics ?Outcome: Progressing ?Goal: Skin Care Protocol Initiated - if Braden Score 18 or less ?Description: If consults are not indicated, leave blank or document N/A ?Outcome: Progressing ?Goal: Nutrition Consult-if indicated ?Outcome: Progressing ?  ?Problem: RH BOWEL ELIMINATION ?Goal: RH STG MANAGE BOWEL WITH ASSISTANCE ?Description: STG Manage Bowel with Min Assistance. ?Outcome: Progressing ?Goal: RH STG MANAGE BOWEL W/MEDICATION W/ASSISTANCE ?Description: STG Manage Bowel with Medication with Min Assistance. ?Outcome: Progressing ?  ?Problem: RH BLADDER ELIMINATION ?Goal: RH STG MANAGE BLADDER WITH ASSISTANCE ?Description: STG Manage Bladder With Min Assistance ?Outcome: Progressing ?Goal: RH STG MANAGE BLADDER WITH MEDICATION WITH ASSISTANCE ?Description: STG Manage Bladder With Medication With Min Assistance. ?Outcome: Progressing ?  ?Problem: RH SKIN INTEGRITY ?Goal: RH STG MAINTAIN SKIN INTEGRITY WITH ASSISTANCE ?Description: STG Maintain Skin Integrity With Min Assistance. ?Outcome: Progressing ?Goal: RH STG ABLE TO PERFORM INCISION/WOUND CARE W/ASSISTANCE ?Description: STG Able To Perform Incision/Wound Care With Min Assistance. ?Outcome: Progressing ?  ?Problem: RH SAFETY ?Goal: RH STG ADHERE TO SAFETY PRECAUTIONS W/ASSISTANCE/DEVICE ?Description: STG Adhere to Safety Precautions With Cues and Reminders. ?Outcome: Progressing ?Goal: RH STG DECREASED RISK OF FALL WITH ASSISTANCE ?Description: STG Decreased Risk of Fall With Min Assistance. ?Outcome: Progressing ?  ?Problem: RH COGNITION-NURSING ?Goal: RH STG USES MEMORY AIDS/STRATEGIES W/ASSIST TO PROBLEM SOLVE ?Description: STG Uses Memory Aids/Strategies With Min Assistance to Problem Solve. ?Outcome: Progressing ?Goal: RH STG ANTICIPATES  NEEDS/CALLS FOR ASSIST W/ASSIST/CUES ?Description: STG Anticipates Needs/Calls for Assist With Cues and Reminders. ?Outcome: Progressing ?  ?Problem: RH PAIN MANAGEMENT ?Goal: RH STG PAIN MANAGED AT OR BELOW PT'S PAIN GOAL ?Description: < 3 on a 0-10 pain scale. ?Outcome: Progressing ?  ?Problem: RH KNOWLEDGE DEFICIT BRAIN INJURY ?Goal: RH STG INCREASE KNOWLEDGE OF SELF CARE AFTER BRAIN INJURY ?Description: Patient will demonstrate knowledge of self-care management, medication/pain management, skin/wound care, dysphagia/thickened liquids management with educational materials and handouts provided by staff independently at discharge. ?Outcome: Progressing ?Goal: RH STG INCREASE KNOWLEDGE OF DYSPHAGIA/FLUID INTAKE ?Description: Patient will demonstrate knowledge of dysphagia diets and thickened liquids with educational materials, handouts, and demonstration provided by staff independently at discharge. ?Outcome: Progressing ?  ?Problem: Consults ?Goal: RH STROKE PATIENT EDUCATION ?Description: See Patient Education module for education specifics  ?Outcome: Progressing ?  ?

## 2021-09-14 NOTE — Progress Notes (Signed)
Physical Therapy Session Note ? ?Patient Details  ?Name: Kayla Maynard ?MRN: [id number] ?Date of Birth: Mar 13, 1956 ? ?Today's Date: 09/14/2021 ?PT Individual Time: 8003-4917 ?PT Individual Time Calculation (min): 60 min  ? ?Short Term Goals: ?Week 1:  PT Short Term Goal 1 (Week 1): Patient will tolerate >20 min of PT session in a moderately busy environment without behaviors. ?PT Short Term Goal 2 (Week 1): Patient will perform basic transfers with CGA consistently. ?PT Short Term Goal 3 (Week 1): Patient will ambulate >150 feet with CGA using LRAD. ?PT Short Term Goal 4 (Week 1): Patient will improve score on Berg Balance Scale by 7 points, MDC. ? ?Skilled Therapeutic Interventions/Progress Updates:  ?   ?Patient in bed asleep upon PT arrival. Patient very slow to arouse and unable to maintain arousal  with max stimulation at beginning of session. Provided 5 min rest break with soft rock playing with increasing volume to improve environmental stimulation and arousal. Patient again slow to arouse, with increased frustration with PT, clenching her fists and turning her back to therapist. Patient noted her shirt was wet, suspect diaphoresis due to no other clear source. Patient changed her shirt with min-mod A bed level, side-lying and propped on elbow, patient declined sitting up to perform task. PT washed her back with total A with improved patient mood after. Patient then proceeded to return to resting with her eyes closed.  ? ?Attempted to bring patient to sitting with total A x2. Patient with poor tolerance and some agitated behaviors, clenching fists, yelling no, and swinging her arms, weakly, at therapist to stop therapist for sitting her up. Provided education on importance of daytime arousal, benefits of mobility, and risk of decline staying in the bed between trials with minimal engagement from patient. Discussed patient's behavior with nursing staff, who report that yesterday and today she slept all times  when not in therapy, no report on sleep quality last night from night shift. NT assist PT with total A +2 with increased time and encouragement to come to sitting. Patient sat EOB with supervision and quickly transferred to the w/c to prevent returning to lying with min A and mild resistance from patient.  ? ?Sat patient in front of the sink to wash her face, patient only patted her lips despite max cues for washing the rest of her face. Patient declined leaving the room or performing mobility. Patient tolerated sitting OOB x15 min during which she answered basic questions about herself with simple one to two word responses, unable to recall details about family and work history, also only oriented to self, provided gentle orientation during discussion. Patient then spontaneously stood and stated, "I'm going back to bed." Ambualted 10 feet to the bed with min A-CGA without AD and cues for safety awareness. She sat EOB, agreeable to having soft waist belt and B wrist restraints placed with patient in L side-lying for pressure relief and per patient request, LPN and NT made aware and in agreement. Patient missed 15 min of skilled PT due to lethargy, RN made aware. Will attempt to make-up missed time as able.    ? ?Patient in bed with restraints secure and Telesitter in place at end of session with breaks locked, bed alarm set, and all needs within reach.  ? ?Therapy Documentation ?Precautions:  ?Precautions ?Precautions: Fall ?Precaution Comments: L crani, bone flap in L abdominal wall ?Restrictions ?Weight Bearing Restrictions: No ?General: ?PT Amount of Missed Time (min): 15 Minutes ? ? ? ?Therapy/Group: Individual  Therapy ? ?Helayne Seminole PT, DPT ? ?09/14/2021, 12:33 PM  ?

## 2021-09-14 NOTE — Discharge Instructions (Addendum)
Inpatient Rehab Discharge Instructions  Kayla Maynard Discharge date and time:  09/28/2021  Activities/Precautions/ Functional Status: Activity: no lifting, driving, or strenuous exercise until cleared by MD Diet: regular diet;  Wound Care: keep wound clean and dry Functional status:  ___ No restrictions     ___ Walk up steps independently _x__ 24/7 supervision/assistance   ___ Walk up steps with assistance ___ Intermittent supervision/assistance  ___ Bathe/dress independently ___ Walk with walker     __x_ Bathe/dress with assistance ___ Walk Independently    ___ Shower independently ___ Walk with assistance    ___ Shower with assistance __x_ No alcohol     ___ Return to work/school ________   Special Instructions:  Wear helmet for protection until follow-up with Dr. Wynetta Emery.  Recommend establishing primary care physician.  No driving, alcohol consumption or tobacco use.   STROKE/TIA DISCHARGE INSTRUCTIONS SMOKING Cigarette smoking nearly doubles your risk of having a stroke & is the single most alterable risk factor  If you smoke or have smoked in the last 12 months, you are advised to quit smoking for your health. Most of the excess cardiovascular risk related to smoking disappears within a year of stopping. Ask you doctor about anti-smoking medications [place] Quit Line: 1-800-QUIT NOW Free Smoking Cessation Classes [phone]  CHOLESTEROL Know your levels; limit fat & cholesterol in your diet  Lipid Panel     Component Value Date/Time   TRIG 106 08/29/2021 0534     Many patients benefit from treatment even if their cholesterol is at goal. Goal: Total Cholesterol (CHOL) less than 160 Goal:  Triglycerides (TRIG) less than 150 Goal:  HDL greater than 40 Goal:  LDL (LDLCALC) less than 100   BLOOD PRESSURE American Stroke Association blood pressure target is less that 120/80 mm/Hg  Your discharge blood pressure is:  BP: 111/79 Monitor your blood pressure Limit your salt and  alcohol intake Many individuals will require more than one medication for high blood pressure  DIABETES (A1c is a blood sugar average for last 3 months) Goal HGBA1c is under 7% (HBGA1c is blood sugar average for last 3 months)  Diabetes: No known diagnosis of diabetes    No results found for: HGBA1C  Your HGBA1c can be lowered with medications, healthy diet, and exercise. Check your blood sugar as directed by your physician Call your physician if you experience unexplained or low blood sugars.  PHYSICAL ACTIVITY/REHABILITATION Goal is 30 minutes at least 4 days per week  Activity: Increase activity slowly, Therapies: Physical Therapy: Outpatient, Occupational Therapy: Outpatient, and Speech Therapy: Outpatient Return to work: when cleared by MD Activity decreases your risk of heart attack and stroke and makes your heart stronger.  It helps control your weight and blood pressure; helps you relax and can improve your mood. Participate in a regular exercise program. Talk with your doctor about the best form of exercise for you (dancing, walking, swimming, cycling).  DIET/WEIGHT Goal is to maintain a healthy weight  Your discharge diet is:  Diet Order             Diet regular Room service appropriate? Yes; Fluid consistency: Thin  Diet effective now                  thin liquids Your height is:  Height: 5\' 6"  (167.6 cm) Your current weight is: Weight: 76.7 kg Your Body Mass Index (BMI) is:  BMI (Calculated): 27.31 Following the type of diet specifically designed for you will help prevent  another stroke. Your goal weight range is:   Your goal Body Mass Index (BMI) is 19-24. Healthy food habits can help reduce 3 risk factors for stroke:  High cholesterol, hypertension, and excess weight.  RESOURCES Stroke/Support Group:  Call [phone]   STROKE EDUCATION PROVIDED/REVIEWED AND GIVEN TO PATIENT Stroke warning signs and symptoms How to activate emergency medical system (call  [phone]). Medications prescribed at discharge. Need for follow-up after discharge. Personal risk factors for stroke. Pneumonia vaccine given: No Flu vaccine given: No My questions have been answered, the writing is legible, and I understand these instructions.  I will adhere to these goals & educational materials that have been provided to me after my discharge from the hospital.    COMMUNITY REFERRALS UPON DISCHARGE:     Outpatient:     PT     OT    ST                            Agency:  [facility]                                     Address: [address], [place], [place] [address]                       Phone:  [phone]                        Appointment Date/Time: *Please expect follow-up within 7-10 business days to schedule your appointment. If you have not received follow-up, be sure to contact the branch directly.*     My questions have been answered and I understand these instructions. I will adhere to these goals and the provided educational materials after my discharge from the hospital.  Patient/Caregiver Signature _______________________________ Date __________  Clinician Signature _______________________________________ Date __________  Please bring this form and your medication list with you to all your follow-up doctor's appointments.

## 2021-09-14 NOTE — Progress Notes (Signed)
Speech Language Pathology Daily Session Note ? ?Patient Details  ?Name: Kayla Maynard ?MRN: [id number] ?Date of Birth: 1956-01-07 ? ?Today's Date: 09/14/2021 ?SLP Individual Time: [id number] ?SLP Individual Time Calculation (min): 45 min and Today's Date: 09/14/2021 ?SLP Missed Time: 15 Minutes ?Missed Time Reason: Patient fatigue ? ?Short Term Goals: ?Week 1: SLP Short Term Goal 1 (Week 1): Patient will utilize external aids for orientation to place, time and situation with Mod A multimodal cues. ?SLP Short Term Goal 2 (Week 1): Patient will demonstrate sustained attention to functional tasks for 5 minutes with Mod verbal cues for redirection. ?SLP Short Term Goal 3 (Week 1): Patient will initiate functional tasks in 50% of opportunities with Mod verbal and tactile cues. ?SLP Short Term Goal 4 (Week 1): Patient will consume current diet with minimal overt s/s of aspiration with Min verbal cues for use of swallowing compensatory strategies. ?SLP Short Term Goal 5 (Week 1): Patient will verbalize wants/needs at the phrase level with Mod verbal cues for word-finding strategies. ? ?Skilled Therapeutic Interventions: Skilled treatment session focused on cognitive goals. Upon arrival, patient was asleep in bed. Patient awakened with extra time and Mod verbal and tactile cues but could only maintain arousal for ~30 second intervals. Patient became restless in bed and groaning. With questioning, patient eventually reported she needed to use the bathroom. Patient was transferred to the [facility] with Mod verbal cues needed for problem solving. Patient was continent of bladder. SLP provided Max encouragement for patient to consume 2 bites of her breakfast meal of Dys. 2 textures with thin liquids. Patient with prolonged AP transit and suspected delay in swallow initiation without overt s/s of aspiration noted. Patient declined further PO intake despite Max A multimodal cues. SLP provided patient with external aids to maximize  orientation to place and time in which patient was able to utilize with Mod verbal cues for attention to task. Patient became lethargic by end of session and required constant verbal and tactile cues for arousal. Due to fatigue and lethargy but was unable to participate in treatment session, therefore, patient missed remaining 15 minutes of session. Patient left upright in bed with alarm on, restraints in place and all needs within reach. Continue with current plan of care.  ?   ? ?Pain ?No/Denies Pain but intermittent groaning throughout session  ? ?Therapy/Group: Individual Therapy ? ?Liya Strollo ?09/14/2021, 1:07 PM ?

## 2021-09-14 NOTE — Progress Notes (Addendum)
?                                                       PROGRESS NOTE ? ? ?Subjective/Complaints: ?Appears to have slept last night. Still waking up when I came in. Indicates that she has headache.  ? ?ROS: Limited due to cognitive/behavioral  ? ? ?Objective: ?  ?No results found. ?Recent Labs  ?  09/12/21 ?0331 09/13/21 ?7829  ?WBC 13.2* 14.1*  ?HGB 13.4 13.4  ?HCT 41.3 41.5  ?PLT 455* 403*  ? ?Recent Labs  ?  09/13/21 ?5621 09/14/21 ?0640  ?NA 134* 136  ?K 6.0* 5.6*  ?CL 110 113*  ?CO2 14* 16*  ?GLUCOSE 128* 146*  ?BUN 79* 64*  ?CREATININE 1.14* 0.81  ?CALCIUM 9.9 9.7  ? ? ?Intake/Output Summary (Last 24 hours) at 09/14/2021 0829 ?Last data filed at 09/13/2021 1749 ?Gross per 24 hour  ?Intake 180 ml  ?Output --  ?Net 180 ml  ?  ? ?  ? ?Physical Exam: ?Vital Signs ?Blood pressure 107/66, pulse 76, temperature 98.9 ?F (37.2 ?C), resp. rate 16, height 5\' 6"  (1.676 m), weight 76.7 kg, SpO2 99 %. ? ?Constitutional: No distress . Vital signs reviewed. ?HEENT: NCAT, EOMI, oral membranes moist ?Neck: supple ?Cardiovascular: RRR without murmur. No JVD    ?Respiratory/Chest: CTA Bilaterally without wheezes or rales. Normal effort    ?GI/Abdomen: BS +, non-tender, non-distended ?Ext: no clubbing, cyanosis, or edema ?Psych: flat ?Skin: Crani site clean dry. Donor site clean and dry also. Few scattered ecchymoses ?Neuro:  fairly alert. Ongoing  word finding deficits and processing delays.   Moves all 4's fairly equally. No obvious sensory deficits.no resting tone.  ?Musculoskeletal: no focal jt or trunk pain   ? ? ?Assessment/Plan: ?1. Functional deficits which require 3+ hours per day of interdisciplinary therapy in a comprehensive inpatient rehab setting. ?Physiatrist is providing close team supervision and 24 hour management of active medical problems listed below. ?Physiatrist and rehab team continue to assess barriers to discharge/monitor patient progress toward functional and medical goals ? ?Care Tool: ? ?Bathing ?    ?Body parts bathed by patient: Right arm, Left arm, Chest, Abdomen, Front perineal area, Buttocks, Right upper leg, Left upper leg, Face  ? Body parts bathed by helper: Left lower leg, Right lower leg ?  ?  ?Bathing assist Assist Level: Moderate Assistance - Patient 50 - 74% ?  ?  ?Upper Body Dressing/Undressing ?Upper body dressing   ?What is the patient wearing?: Pull over shirt ?   ?Upper body assist Assist Level: Minimal Assistance - Patient > 75% ?   ?Lower Body Dressing/Undressing ?Lower body dressing ? ? ?   ?What is the patient wearing?: Incontinence brief ? ?  ? ?Lower body assist Assist for lower body dressing: Maximal Assistance - Patient 25 - 49% ?   ? ?Toileting ?Toileting    ?Toileting assist Assist for toileting: 2 Helpers ?  ?  ?Transfers ?Chair/bed transfer ? ?Transfers assist ?   ? ?Chair/bed transfer assist level: 2 Helpers ?  ?  ?Locomotion ?Ambulation ? ? ?Ambulation assist ? ?   ? ?Assist level: Minimal Assistance - Patient > 75% ?Assistive device: No Device ?Max distance: 85 ft  ? ?Walk 10 feet activity ? ? ?Assist ?   ? ?Assist level: Minimal Assistance -  Patient > 75% ?Assistive device: No Device  ? ?Walk 50 feet activity ? ? ?Assist   ? ?Assist level: Minimal Assistance - Patient > 75% ?Assistive device: No Device  ? ? ?Walk 150 feet activity ? ? ?Assist   ? ?Assist level: Minimal Assistance - Patient > 75% ?Assistive device: No Device ?  ? ?Walk 10 feet on uneven surface  ?activity ? ? ?Assist   ? ? ?Assist level: Minimal Assistance - Patient > 75% ?Assistive device: Other (comment) (R rail)  ? ?Wheelchair ? ? ? ? ?Assist Is the patient using a wheelchair?: Yes ?Type of Wheelchair: Manual ?  ? ?Wheelchair assist level: Dependent - Patient 0% (pt unable to follow cues for propulsion technique) ?   ? ? ?Wheelchair 50 feet with 2 turns activity ? ? ? ?Assist ? ?  ?  ? ? ?Assist Level: Dependent - Patient 0%  ? ?Wheelchair 150 feet activity  ? ? ? ?Assist ?   ? ? ?Assist Level: Dependent -  Patient 0%  ? ?Blood pressure 107/66, pulse 76, temperature 98.9 ?F (37.2 ?C), resp. rate 16, height 5\' 6"  (1.676 m), weight 76.7 kg, SpO2 99 %. ? ?Medical Problem List and Plan: ?1. 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 ?            -patient may shower ?            -ELOS/Goals: 16-22 days, supervision to min assist goals ?             -Continue CIR therapies including PT, OT, and SLP  ?2.  Antithrombotics: ?-DVT/anticoagulation:  Pharmaceutical: Heparin ?            -antiplatelet therapy: None ?3. Pain/headaches: Tylenol, hydrocodone as needed ? -will schedule topamax 25mg  qhs ?4. Mood/sleep: Pt's days/nights upside down. ?            -  sleep chart ?            -antipsychotic agents: added low dose seroquel tonight for sleep with back up dose ?            -melatonin ?5. Neuropsych: This patient is not capable of making decisions on her own behalf. ?            -soft wrist restraints for safety/NGT ?6. Skin/Wound Care: Routine skin care checks ?            -- monitor surgical incisions ?7. Fluids/Electrolytes/Nutrition: Intake still minimal ?-- Dysphagia 3 diet, thin liquids--eating nothing yet ?-encourage PO ?-5/17 BUN down to 64 today, continue IVF 24/7, recheck labs tomorrow ?            --continue Osmolite 1.5 via core track; 83 mL/h x 12 hours (run overnight) ?            -- Continue Prosource, Ensure ?            --maintain h20 flushes at 100cc q6 for now ?10: Seizure activity on admission provoked by [facility]:  ?--continue Keppra ?--continue Nimotop through 5/20 ?11: s/p craniectomy: plan cranioplasty in 6-8 weeks ?  -family does not want helmet- ?12. Leukocytosis: ? -likely reactive. Continue to follow, no s/s infection ? ?LOS: ?2 days ?A FACE TO FACE EVALUATION WAS PERFORMED ? ?[facility] ?09/14/2021, 8:29 AM  ? ?  ?

## 2021-09-14 NOTE — Progress Notes (Signed)
Occupational Therapy Session Note ? ?Patient Details  ?Name: Shaquila Sigman ?MRN: [id number] ?Date of Birth: June 14, 1955 ? ?Today's Date: 09/14/2021 ?OT Individual Time: 7445-1460 ?OT Individual Time Calculation (min): 75 min  ? ? ?Short Term Goals: ?Week 1:  OT Short Term Goal 1 (Week 1): Pt will groom at sink wiht no more than min cuing for seqencing ?OT Short Term Goal 2 (Week 1): Pt will complete toilet transfers with CGA consistently ?OT Short Term Goal 3 (Week 1): Pt will orient clothing with no more than min cuing ?OT Short Term Goal 4 (Week 1): Pt will thread BLE into pants to dmeo improved praxis ? ?Skilled Therapeutic Interventions/Progress Updates:  ?   ?Pt received in bed with unrated HA. NT/RN aware and will bring medicaiton. This session pt mostly limited by arousal impacting initaiton requiring MAX multimodal cuing for any task and resulting in low frustration tolerance and frequent falling asleep. Pt able ot thread 1LE today and indicating R knee pain was PTA. Pt STS and SPT with MIN A overall for steadying and managing IV pole. At sink pt completes face washing with total cuing for attention. Attempted to self feed with pt eventually able to pick spoon off table, however pt continues to fall asleep therefore not safe to compelte swallow/feeding from an arousal standpoint. Pt taken to tx gym and trialed BITS hitting 3 dots in 1 min and requiring MAX cuing for scanning. 9HPT trialed, hwoever d/t poor arousal pt forgetting where she was in task and putting pegs back in holes when trying to take out.  ? ?Pt left at end of session in bed with restraints reapplied with exit alarm on, call light in reach and all needs met ? ? ?Therapy Documentation ?Precautions:  ?Precautions ?Precautions: Fall ?Precaution Comments: L crani, bone flap in L abdominal wall ?Restrictions ?Weight Bearing Restrictions: No ?General: ?  ? ?Therapy/Group: Individual Therapy ? ?Lowella Dell Alexio Sroka ?09/14/2021, 6:49 AM ?

## 2021-09-14 NOTE — Progress Notes (Signed)
Patient ID: Kayla Maynard, female   DOB: [birth year], 66 y.o.   MRN: [id number] ? ?SW spoke with pt son Ortencia Kick to inform on ELOS 12-16 days. SW shared there will be updates after team conference on Tuesday. SW discussed alternative options if pt has not progressed to where she can be left alone. SW reminded him to allow progress to be made, and will provide updates on Tuesday. He intends to be here later on today to see his mother. He is aware statement of service will be left in room for him to review.  ? ?Cecile Sheerer, MSW, LCSWA ?Office: [phone] ?Cell: [phone] ?Fax: [phone]  ?

## 2021-09-14 NOTE — Care Management (Signed)
Inpatient Rehabilitation Center ?Individual Statement of Services ? ?Patient Name:  Kayla Maynard  ?Date:  09/14/2021 ? ?Welcome to the Inpatient Rehabilitation Center.  Our goal is to provide you with an individualized program based on your diagnosis and situation, designed to meet your specific needs.  With this comprehensive rehabilitation program, you will be expected to participate in at least 3 hours of rehabilitation therapies Monday-Friday, with modified therapy programming on the weekends. ? ?Your rehabilitation program will include the following services:  Physical Therapy (PT), Occupational Therapy (OT), Speech Therapy (ST), 24 hour per day rehabilitation nursing, Therapeutic Recreaction (TR), Psychology, Neuropsychology, Care Coordinator, Rehabilitation Medicine, Nutrition Services, Pharmacy Services, and Other ? ?Weekly team conferences will be held on Tuesdays to discuss your progress.  Your Inpatient Rehabilitation Care Coordinator will talk with you frequently to get your input and to update you on team discussions.  Team conferences with you and your family in attendance may also be held. ? ?Expected length of stay: 12-16 days   ? ?Overall anticipated outcome: Supervision ? ?Depending on your progress and recovery, your program may change. Your Inpatient Rehabilitation Care Coordinator will coordinate services and will keep you informed of any changes. Your Inpatient Rehabilitation Care Coordinator's name and contact numbers are listed  below. ? ?The following services may also be recommended but are not provided by the Inpatient Rehabilitation Center:  ?Driving Evaluations ?Home Health Rehabiltiation Services ?Outpatient Rehabilitation Services ?Vocational Rehabilitation ?  ?Arrangements will be made to provide these services after discharge if needed.  Arrangements include referral to agencies that provide these services. ? ?Your insurance has been verified to be:  Medicare A/B ? ?Your primary  doctor is:  No PCP listed ? ?Pertinent information will be shared with your doctor and your insurance company. ? ?Inpatient Rehabilitation Care Coordinator:  Susie Cassette [phone] or (C) [phone] ? ?Information discussed with and copy given to patient by: Gretchen Short, 09/14/2021, 12:07 PM    ?

## 2021-09-14 NOTE — Progress Notes (Signed)
Inpatient Rehabilitation Care Coordinator ?Assessment and Plan ?Patient Details  ?Name: Kayla Maynard ?MRN: [id number] ?Date of Birth: Mar 21, 1956 ? ?Today's Date: 09/14/2021 ? ?Hospital Problems: Principal Problem: ?  ICH (intracerebral hemorrhage) (HCC) ? ?Past Medical History:  ?Past Medical History:  ?Diagnosis Date  ? Angina at rest [facility])   ? ?Past Surgical History:  ?Past Surgical History:  ?Procedure Laterality Date  ? CRANIOTOMY Left 08/27/2021  ? Procedure: CRANIOTOMY FOR EVACUATION OF SUBDURAL HEMATOMA WITH PLACEMENT OF BONE FLAP IN ABDOMEN;  Surgeon: Donalee Citrin, MD;  Location: [facility] OR;  Service: Neurosurgery;  Laterality: Left;  ? IR ANGIO INTRA EXTRACRAN SEL COM CAROTID INNOMINATE UNI R MOD SED  08/27/2021  ? IR ANGIO INTRA EXTRACRAN SEL INTERNAL CAROTID UNI L MOD SED  08/30/2021  ? IR ANGIO VERTEBRAL SEL SUBCLAVIAN INNOMINATE UNI R MOD SED  08/30/2021  ? IR ANGIOGRAM FOLLOW UP STUDY  08/27/2021  ? IR ANGIOGRAM FOLLOW UP STUDY  08/27/2021  ? IR ANGIOGRAM FOLLOW UP STUDY  08/27/2021  ? IR ANGIOGRAM FOLLOW UP STUDY  08/27/2021  ? IR ANGIOGRAM FOLLOW UP STUDY  08/27/2021  ? IR ANGIOGRAM FOLLOW UP STUDY  08/27/2021  ? IR ANGIOGRAM FOLLOW UP STUDY  08/27/2021  ? IR CT HEAD LTD  08/27/2021  ? IR NEURO EACH ADD'L AFTER BASIC UNI LEFT (MS)  08/30/2021  ? IR TRANSCATH/EMBOLIZ  08/27/2021  ? RADIOLOGY WITH ANESTHESIA N/A 08/27/2021  ? Procedure: RADIOLOGY WITH ANESTHESIA;  Surgeon: Julieanne Cotton, MD;  Location: [facility] OR;  Service: Radiology;  Laterality: N/A;  ? STENT PLACEMENT VASCULAR ([facility] HX)    ? ?Social History:  reports that she has been smoking cigarettes. She has been smoking an average of 1 pack per day. She has never used smokeless tobacco. She reports current alcohol use. She reports that she does not use drugs. ? ?Family / Support Systems ?Marital Status: Widow/Widower ?How Long?: Fall 2022 ?Patient Roles: Parent ?Spouse/Significant Other: Widow ?Children: 3 adult children ?Other Supports: Son Ortencia Kick ?Anticipated  Caregiver: N/A ?Ability/Limitations of Caregiver: Pt son Ortencia Kick and his wife work full time and not able to provide 24/7 care ?Caregiver Availability: Intermittent ?Family Dynamics: pt lives alone and manages all of her own care needs ? ?Social History ?Preferred language: English ?Religion:  ?Cultural Background: pt son Ortencia Kick reports she has not worked in a few years due to getting hurt on the job. ?Education: 6th grade ?Health Literacy - How often do you need to have someone help you when you read instructions, pamphlets, or other written material from your doctor or pharmacy?: Rarely ?Writes: Yes ?Employment Status: Disabled ?Date Retired/Disabled/Unemployed: Unknown ?Legal History/Current Legal Issues: Pt son denies ?Guardian/Conservator: N/A  ? ?Abuse/Neglect ?Abuse/Neglect Assessment Can Be Completed: Unable to assess, patient is non-responsive or altered mental status ?Physical Abuse: Denies ?Verbal Abuse: Denies ?Sexual Abuse: Denies ?Exploitation of patient/patient's resources: Denies ?Self-Neglect: Denies ? ?Patient response to: ?Social Isolation - How often do you feel lonely or isolated from those around you?: Patient unable to respond ? ?Emotional Status ?Pt's affect, behavior and adjustment status: Pt unable to engage in approprite dialogue with SW. ?Recent Psychosocial Issues: N/A ?Psychiatric History: Pt son denies ?Substance Abuse History: Pt son reports he is aware she smokes cigarettes daily 1/2pl-1ppd. Reports occassional etoh use. Denies rec drug use. ? ?Patient / Family Perceptions, Expectations & Goals ?Pt/Family understanding of illness & functional limitations: Pt family has  a general understanding of pt care needs ?Premorbid pt/family roles/activities: Independent ?Anticipated changes in roles/activities/participation: Assistance with ADLs/IADLs ?  Pt/family expectations/goals: Her son's goal is for his mother to "preferrably to get back to where she was before this happened or atleast close  to it and function independently." ? ?Community Resources ?Community Agencies: None ?Premorbid Home Care/DME Agencies: None ?Transportation available at discharge: TBD ?Is the patient able to respond to transportation needs?: Yes ?In the past 12 months, has lack of transportation kept you from medical appointments or from getting medications?: No ?In the past 12 months, has lack of transportation kept you from meetings, work, or from getting things needed for daily living?: No ?Resource referrals recommended: Neuropsychology ? ?Discharge Planning ?Living Arrangements: Alone ?Support Systems: Children ?Type of Residence: Private residence ?Insurance Resources: Medicare ?Financial Resources: SSD ?Financial Screen Referred: No ?Living Expenses: Own ?Money Management: Patient ?Does the patient have any problems obtaining your medications?: No ?Home Management: Pt son reports she managed all of her own care needs. Pt son states she was a very private person. ?Patient/Family Preliminary Plans: TBD ?Care Coordinator Barriers to Discharge: Decreased caregiver support, Lack of/limited family support ?Care Coordinator Barriers to Discharge Comments: Intermittent support only at d/c. ?Care Coordinator Anticipated Follow Up Needs: HH/OP ?Expected length of stay: 12-16 days ? ?Clinical Impression ?SW completed assessment with pt son. Pt is not a veteran, but her husband served. 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 VA benefits). No DME.  ? ?Gretchen Short ?09/14/2021, 2:41 PM ? ?  ?

## 2021-09-15 DIAGNOSIS — E875 Hyperkalemia: Secondary | ICD-10-CM

## 2021-09-15 LAB — BASIC METABOLIC PANEL
Anion gap: 6 (ref 5–15)
BUN: 36 mg/dL — ABNORMAL HIGH (ref 8–23)
CO2: 18 mmol/L — ABNORMAL LOW (ref 22–32)
Calcium: 9.6 mg/dL (ref 8.9–10.3)
Chloride: 111 mmol/L (ref 98–111)
Creatinine, Ser: 0.72 mg/dL (ref 0.44–1.00)
GFR, Estimated: >60 mL/min (ref >60)
Glucose, Bld: 132 mg/dL — ABNORMAL HIGH (ref 70–99)
Potassium: 5.5 mmol/L — ABNORMAL HIGH (ref 3.5–5.1)
Sodium: 135 mmol/L (ref 135–145)

## 2021-09-15 LAB — GLUCOSE, CAPILLARY
Glucose-Capillary: 101 mg/dL — ABNORMAL HIGH (ref 70–99)
Glucose-Capillary: 101 mg/dL — ABNORMAL HIGH (ref 70–99)
Glucose-Capillary: 114 mg/dL — ABNORMAL HIGH (ref 70–99)
Glucose-Capillary: 120 mg/dL — ABNORMAL HIGH (ref 70–99)
Glucose-Capillary: 166 mg/dL — ABNORMAL HIGH (ref 70–99)
Glucose-Capillary: 89 mg/dL (ref 70–99)

## 2021-09-15 MED ORDER — OSMOLITE 1.5 CAL PO LIQD
996.0000 mL | ORAL | Status: DC
  Administered 2021-09-15 – 2021-09-16 (×2): 1000 mL
  Filled 2021-09-15 (×3): qty 1000

## 2021-09-15 MED ORDER — OSMOLITE 1.5 CAL PO LIQD: 996.0000 mL | ORAL | Status: DC

## 2021-09-15 MED ORDER — ENOXAPARIN SODIUM 40 MG/0.4ML IJ SOSY
40.0000 mg | PREFILLED_SYRINGE | INTRAMUSCULAR | Status: DC
  Administered 2021-09-15 – 2021-09-21 (×7): 40 mg via SUBCUTANEOUS
  Filled 2021-09-15 (×6): qty 0.4

## 2021-09-15 MED ORDER — SODIUM CHLORIDE 0.9 % IV SOLN: INTRAVENOUS | Status: DC

## 2021-09-15 NOTE — Progress Notes (Signed)
Physical Therapy Session Note  Patient Details  Name: Kayla Maynard MRN: [id number] Date of Birth: 12-Sep-1955  Today's Date: 09/15/2021 PT Individual Time: 1130-1220 and 1415-1455 PT Individual Time Calculation (min): 50 min and 40 min   Short Term Goals: Week 1:  PT Short Term Goal 1 (Week 1): Patient will tolerate >20 min of PT session in a moderately busy environment without behaviors. PT Short Term Goal 2 (Week 1): Patient will perform basic transfers with CGA consistently. PT Short Term Goal 3 (Week 1): Patient will ambulate >150 feet with CGA using LRAD. PT Short Term Goal 4 (Week 1): Patient will improve score on Berg Balance Scale by 7 points, MDC.  Skilled Therapeutic Interventions/Progress Updates:     Session 1: Patient in bed asleep upon PT arrival. Patient slow to arouse and resistant to mobility initially. LPN provided patient with tylenol for un-rated headache pain with patient in the bed. PT provided repositioning, rest breaks, and distraction as pain interventions throughout session. Patient eventually motivated by toileting to initiate mobility.  Patient remains lethargic and sensitive to stimulation and light. Improved tolerance of activities performed today, sitting OOB 30 min of session and 1.5 hours following session per nursing staff.   Therapeutic Activity: Bed Mobility: Patient performed supine to sit with min A for trunk support. Provided verbal cues for initiation and bringing her legs off the bed before pushing up from side-lying. Transfers: Patient performed sit to/from stand x3 with CGA without and AD. Provided verbal cues for initiation and forward weight shift. Patient performed an ambulatory transfer to/from the bathroom with CGA for steadying support. Patient was continent of bladder and bowl during toileting, performed peri-care with total A and lower body clothing management with supervision. Performed hand hygiene at the sink with min cues for locating  soap and CGA for standing balance. Patient initially refused any ambulatory or standing activities, but agreeable to be transported in the w/c. Transported patient to the ortho gym to trial the BITS visual scanning activity: Hits 4, accuracy 66.67%, time 1 min, reaction time 10.05 sec Patient did not engage with this activity for a second trial and began closing her eyes in the w/c.   Gait Training:  Patient ambulated >100 feet without an AD to her room (in sight throughout for motivation) with CGA. Ambulated with decreased gait speed, decreased step length and height, increased BOS, mild forward trunk lean, and downward head gaze. Provided verbal cues for erect posture, increased step length and height, and encouragement throughout.  Patient in recliner handed off to LPN and RN at end of session.   Session 2: Patient in bed upon PT arrival. Patient alert and eager to be released from the restraints and agreeable to PT session. Patient denied pain during session. Patient reports dry mouth throughout session, and upset that staff has not provided her fluids, per LPN, patient has been asleep and did not aroused to offers of food or drink or refused. Provided patient with 1.5 cups of water throughout session.   Therapeutic Activity: Bed Mobility: Patient performed supine to/from sit with min A-supervision, as above.  Transfers: Patient performed sit to/from stand x4 with CGA-supervision. Provided verbal cues for initiation and forward weight shift. Patient performed an ambulatory transfer to/from the bathroom with CGA for steadying support. Patient was continent of bladder and bowl during toileting, performed peri-care with total A and lower body clothing management with supervision. Performed hand hygiene at the sink with min cues for locating soap and CGA for  standing balance. Patient attempted to initiate balance assessment, only performed 2 items due to decreased attention to task. Stood with use of  hands with supervision and stood x2 min static standing with supervision and without sway.   Patient required increased time and initiation for all mobility. Noted with improved arousal and engagement with therapist throughout session. Provided gentle orientation with patient nodding her head throughout. Patient initially agreeable to sitting OOB, but concerned when PT attempted to apply wrist restraints, stated, "I thought I didn't have to wear those if I sat here." PT educated on safety risk of pulling out her tube due to cognitive deficits. Patient then requested to go back to bed. Agreeable to have restraints placed in the bed with patient in side-lying, LPN made aware.   Patient in bed with soft wrist and waist belt restraints secured and telesitter in the room at end of session with breaks locked, bed alarm set, and all needs within reach.   Therapy Documentation Precautions:  Precautions Precautions: Fall Precaution Comments: L crani, bone flap in L abdominal wall Restrictions Weight Bearing Restrictions: No   Therapy/Group: Individual Therapy  Ronn Smolinsky L Kiera Hussey PT, DPT  09/15/2021, 5:35 PM

## 2021-09-15 NOTE — Progress Notes (Addendum)
PROGRESS NOTE   Subjective/Complaints: Pt up and alert. Says head feels better.   ROS:  limited d/t cognition  Objective:   No results found. Recent Labs    09/13/21 0544  WBC 14.1*  HGB 13.4  HCT 41.5  PLT 403*   Recent Labs    09/14/21 0640 09/15/21 0540  NA 136 135  K 5.6* 5.5*  CL 113* 111  CO2 16* 18*  GLUCOSE 146* 132*  BUN 64* 36*  CREATININE 0.81 0.72  CALCIUM 9.7 9.6    Intake/Output Summary (Last 24 hours) at 09/15/2021 1001 Last data filed at 09/15/2021 0944 Gross per 24 hour  Intake 1459.15 ml  Output --  Net 1459.15 ml        Physical Exam: Vital Signs Blood pressure 109/66, pulse 86, temperature 97.6 F (36.4 C), temperature source Oral, resp. rate 16, height 5\' 6"  (1.676 m), weight 76.7 kg, SpO2 99 %.  Constitutional: No distress . Vital signs reviewed. HEENT: NCAT, EOMI, oral membranes moist Neck: supple Cardiovascular: RRR without murmur. No JVD    Respiratory/Chest: CTA Bilaterally without wheezes or rales. Normal effort    GI/Abdomen: BS +, non-tender, non-distended Ext: no clubbing, cyanosis, or edema Psych: flat but more engaged  Skin: Crani site clean dry. Donor site clean and dry also. Few scattered ecchymoses on arms Neuro:  alert, oriented to self and "hospital". Follows commands. More spontaneous language.   Moves all 4's fairly equally. No obvious sensory deficits.no resting tone.  Musculoskeletal: no focal jt or trunk pain     Assessment/Plan: 1. Functional deficits which require 3+ hours per day of interdisciplinary therapy in a comprehensive inpatient rehab setting. Physiatrist is providing close team supervision and 24 hour management of active medical problems listed below. Physiatrist and rehab team continue to assess barriers to discharge/monitor patient progress toward functional and medical goals  Care Tool:  Bathing    Body parts bathed by patient: Right  arm, Left arm, Chest, Abdomen, Front perineal area, Buttocks, Right upper leg, Left upper leg, Face   Body parts bathed by helper: Left lower leg, Right lower leg     Bathing assist Assist Level: Moderate Assistance - Patient 50 - 74%     Upper Body Dressing/Undressing Upper body dressing   What is the patient wearing?: Pull over shirt    Upper body assist Assist Level: Minimal Assistance - Patient > 75%    Lower Body Dressing/Undressing Lower body dressing      What is the patient wearing?: Incontinence brief     Lower body assist Assist for lower body dressing: Maximal Assistance - Patient 25 - 49%     Toileting Toileting    Toileting assist Assist for toileting: 2 Helpers     Transfers Chair/bed transfer  Transfers assist     Chair/bed transfer assist level: 2 Helpers     Locomotion Ambulation   Ambulation assist      Assist level: Minimal Assistance - Patient > 75% Assistive device: No Device Max distance: 85 ft   Walk 10 feet activity   Assist     Assist level: Minimal Assistance - Patient > 75% Assistive device: No Device  Walk 50 feet activity   Assist    Assist level: Minimal Assistance - Patient > 75% Assistive device: No Device    Walk 150 feet activity   Assist    Assist level: Minimal Assistance - Patient > 75% Assistive device: No Device    Walk 10 feet on uneven surface  activity   Assist     Assist level: Minimal Assistance - Patient > 75% Assistive device: Other (comment) (R rail)   Wheelchair     Assist Is the patient using a wheelchair?: Yes Type of Wheelchair: Manual    Wheelchair assist level: Dependent - Patient 0% (pt unable to follow cues for propulsion technique)      Wheelchair 50 feet with 2 turns activity    Assist        Assist Level: Dependent - Patient 0%   Wheelchair 150 feet activity     Assist      Assist Level: Dependent - Patient 0%   Blood pressure 109/66,  pulse 86, temperature 97.6 F (36.4 C), temperature source Oral, resp. rate 16, height 5\' 6"  (1.676 m), weight 76.7 kg, SpO2 99 %.  Medical Problem List and Plan: 1. 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             -patient may shower             -ELOS/Goals: 16-22 days, supervision to min assist goals             -Continue CIR therapies including PT, OT, and SLP  2.  Antithrombotics: -DVT/anticoagulation:  Pharmaceutical: Heparin             -antiplatelet therapy: None 3. Pain/headaches: Tylenol, hydrocodone as needed  -will schedule topamax 25mg  qhs 4. Mood/sleep: Pt's days/nights upside down.             -  sleep chart             -antipsychotic agents: added low dose seroquel tonight for sleep with back up dose             -melatonin 5. Neuropsych: This patient is not capable of making decisions on her own behalf.             -soft wrist restraints for safety/NGT 6. Skin/Wound Care: Routine skin care checks             -- monitor surgical incisions 7. Fluids/Electrolytes/Nutrition: Intake still minimal -- Dysphagia 3 diet, thin liquids--eating nothing yet -encourage PO -5/18 BUN down to 36 today, continue IVF but change to HS -recheck BMET tomorrow -potassium still elevated 5.5--hold ACE             --continue Osmolite 1.5 via core track; 83 mL/h x 12 hours (run overnight)   -decrease to help stimulate appetite             -- Continue Prosource, Ensure             --maintain h20 flushes at 100cc q6 for now 10: Seizure activity on admission provoked by [facility]:  --continue Keppra --continue Nimotop through 5/20 11: s/p craniectomy: plan cranioplasty in 6-8 weeks   -family does not want helmet- 12. Leukocytosis:  -likely reactive. Continue to follow, no s/s infection  -repeat Friday cbc  LOS: 3 days A FACE TO FACE EVALUATION WAS PERFORMED  6/20 09/15/2021, 10:01 AM

## 2021-09-15 NOTE — Progress Notes (Signed)
Speech Language Pathology Daily Session Note  Patient Details  Name: Kayla Maynard MRN: [id number] Date of Birth: Jul 21, 1955  Today's Date: 09/15/2021 SLP Individual Time: 0263-7858 SLP Individual Time Calculation (min): 55 min  Short Term Goals: Week 1: SLP Short Term Goal 1 (Week 1): Patient will utilize external aids for orientation to place, time and situation with Mod A multimodal cues. SLP Short Term Goal 2 (Week 1): Patient will demonstrate sustained attention to functional tasks for 5 minutes with Mod verbal cues for redirection. SLP Short Term Goal 3 (Week 1): Patient will initiate functional tasks in 50% of opportunities with Mod verbal and tactile cues. SLP Short Term Goal 4 (Week 1): Patient will consume current diet with minimal overt s/s of aspiration with Min verbal cues for use of swallowing compensatory strategies. SLP Short Term Goal 5 (Week 1): Patient will verbalize wants/needs at the phrase level with Mod verbal cues for word-finding strategies.  Skilled Therapeutic Interventions: Skilled treatment session focused on cognitive-linguistic goals. Upon arrival, patient was asleep in bed and required extra time for arousal. With extra time, patient sat EOB and was transferred to the wheelchair in order to maximize arousal. SLP facilitated session by providing extra time and overall Min verbal cues for reading comprehension at the word and phrase level. Max-Total A was also needed for written expression of functional information and at the word and sentence level. Suspect function impacted by visual impairments that are difficult to assess due to cognitive deficits. Patient named basic, common functional pictures independently but required Max phonemic and sentence completion cues for naming of more novel items. Suspect patient has a mild-moderate language impairment impacting all four modaltieis of language. Despite patient having a low frustration tolerance at times, patient  demonstrated the appropriate amount of attention and effort to all tasks in a moderately distracting environment. Patient transferred back to bed at end of session. Patient left supine with her sister present. Continue with current plan of care.      Pain No/Denies Pain   Therapy/Group: Individual Therapy  Thos Matsumoto 09/15/2021, 3:13 PM

## 2021-09-15 NOTE — Progress Notes (Signed)
Occupational Therapy TBI Note  Patient Details  Name: Kayla Maynard MRN: [id number] Date of Birth: 12-17-1955  Today's Date: 09/15/2021 OT Individual Time: 0815-0900 OT Individual Time Calculation (min): 45 min    Short Term Goals: Week 1:  OT Short Term Goal 1 (Week 1): Pt will groom at sink wiht no more than min cuing for seqencing OT Short Term Goal 2 (Week 1): Pt will complete toilet transfers with CGA consistently OT Short Term Goal 3 (Week 1): Pt will orient clothing with no more than min cuing OT Short Term Goal 4 (Week 1): Pt will thread BLE into pants to dmeo improved praxis  Skilled Therapeutic Interventions/Progress Updates:    Pt supine, restless and moving constantly in bed, tapping her thigh. When questioned, pt endorsed headache. Environment was adjusted to assist with sensory stimulation- dimming lights and closing door. Multiple sensory breaks provided as pt became more restless, slightly agitated with interaction with OT. Therapeutic use of self to increase participation. She was able to come to EOB with min A, self initiating. She requested to go for a walk and use the bathroom. She completed functional mobility with CGA into the bathroom, voiding urine. She donned pants with mod A. Pt completed 300 ft of functional mobility with CGA before requesting to get back to bed. She was left supine with all needs met. Bed alarm set. Waist and wrist restraints on. 15 min missed.   Therapy Documentation Precautions:  Precautions Precautions: Fall Precaution Comments: L crani, bone flap in L abdominal wall Restrictions Weight Bearing Restrictions: No  Agitated Behavior Scale: TBI Observation Details Observation Environment: CIR Start of observation period - Date: 09/15/21 Start of observation period - Time: 0815 End of observation period - Date: 09/15/21 End of observation period - Time: 0900 Agitated Behavior Scale (DO NOT LEAVE BLANKS) Short attention span, easy  distractibility, inability to concentrate: Present to a moderate degree Impulsive, impatient, low tolerance for pain or frustration: Present to a moderate degree Uncooperative, resistant to care, demanding: Present to a slight degree Violent and/or threatening violence toward people or property: Absent Explosive and/or unpredictable anger: Absent Rocking, rubbing, moaning, or other self-stimulating behavior: Present to a moderate degree Pulling at tubes, restraints, etc.: Absent Wandering from treatment areas: Absent Restlessness, pacing, excessive movement: Present to a moderate degree Repetitive behaviors, motor, and/or verbal: Absent Rapid, loud, or excessive talking: Absent Sudden changes of mood: Present to a moderate degree Easily initiated or excessive crying and/or laughter: Absent Self-abusiveness, physical and/or verbal: Absent Agitated behavior scale total score: 25    Therapy/Group: Individual Therapy  Curtis Sites 09/15/2021, 9:17 AM

## 2021-09-15 NOTE — Plan of Care (Signed)
  Problem: Safety: ?Goal: Non-violent Restraint(s) ?Outcome: Progressing ?  ?Problem: Consults ?Goal: RH BRAIN INJURY PATIENT EDUCATION ?Description: Description: See Patient Education module for eduction specifics ?Outcome: Progressing ?Goal: Skin Care Protocol Initiated - if Braden Score 18 or less ?Description: If consults are not indicated, leave blank or document N/A ?Outcome: Progressing ?Goal: Nutrition Consult-if indicated ?Outcome: Progressing ?  ?Problem: RH BOWEL ELIMINATION ?Goal: RH STG MANAGE BOWEL WITH ASSISTANCE ?Description: STG Manage Bowel with Min Assistance. ?Outcome: Progressing ?Goal: RH STG MANAGE BOWEL W/MEDICATION W/ASSISTANCE ?Description: STG Manage Bowel with Medication with Min Assistance. ?Outcome: Progressing ?  ?Problem: RH BLADDER ELIMINATION ?Goal: RH STG MANAGE BLADDER WITH ASSISTANCE ?Description: STG Manage Bladder With Min Assistance ?Outcome: Progressing ?Goal: RH STG MANAGE BLADDER WITH MEDICATION WITH ASSISTANCE ?Description: STG Manage Bladder With Medication With Min Assistance. ?Outcome: Progressing ?  ?Problem: RH SKIN INTEGRITY ?Goal: RH STG MAINTAIN SKIN INTEGRITY WITH ASSISTANCE ?Description: STG Maintain Skin Integrity With Min Assistance. ?Outcome: Progressing ?Goal: RH STG ABLE TO PERFORM INCISION/WOUND CARE W/ASSISTANCE ?Description: STG Able To Perform Incision/Wound Care With Min Assistance. ?Outcome: Progressing ?  ?Problem: RH SAFETY ?Goal: RH STG ADHERE TO SAFETY PRECAUTIONS W/ASSISTANCE/DEVICE ?Description: STG Adhere to Safety Precautions With Cues and Reminders. ?Outcome: Progressing ?Goal: RH STG DECREASED RISK OF FALL WITH ASSISTANCE ?Description: STG Decreased Risk of Fall With Min Assistance. ?Outcome: Progressing ?  ?Problem: RH COGNITION-NURSING ?Goal: RH STG USES MEMORY AIDS/STRATEGIES W/ASSIST TO PROBLEM SOLVE ?Description: STG Uses Memory Aids/Strategies With Min Assistance to Problem Solve. ?Outcome: Progressing ?Goal: RH STG ANTICIPATES  NEEDS/CALLS FOR ASSIST W/ASSIST/CUES ?Description: STG Anticipates Needs/Calls for Assist With Cues and Reminders. ?Outcome: Progressing ?  ?Problem: RH PAIN MANAGEMENT ?Goal: RH STG PAIN MANAGED AT OR BELOW PT'S PAIN GOAL ?Description: < 3 on a 0-10 pain scale. ?Outcome: Progressing ?  ?Problem: RH KNOWLEDGE DEFICIT BRAIN INJURY ?Goal: RH STG INCREASE KNOWLEDGE OF SELF CARE AFTER BRAIN INJURY ?Description: Patient will demonstrate knowledge of self-care management, medication/pain management, skin/wound care, dysphagia/thickened liquids management with educational materials and handouts provided by staff independently at discharge. ?Outcome: Progressing ?Goal: RH STG INCREASE KNOWLEDGE OF DYSPHAGIA/FLUID INTAKE ?Description: Patient will demonstrate knowledge of dysphagia diets and thickened liquids with educational materials, handouts, and demonstration provided by staff independently at discharge. ?Outcome: Progressing ?  ?Problem: Consults ?Goal: RH STROKE PATIENT EDUCATION ?Description: See Patient Education module for education specifics  ?Outcome: Progressing ?  ?

## 2021-09-15 NOTE — IPOC Note (Addendum)
Overall Plan of Care [facility]) Patient Details Name: Kayla Maynard MRN: [id number] DOB: 1955-08-20  Admitting Diagnosis: ICH (intracerebral hemorrhage) [facility])  Hospital Problems: Principal Problem:   ICH (intracerebral hemorrhage) (HCC)     Functional Problem List: Nursing Behavior, Bladder, Bowel, Edema, Endurance, Medication Management, Motor, Pain, Safety, Skin Integrity  PT Balance, Perception, Safety, Behavior, Edema, Sensory, Skin Integrity, Endurance, Motor, Nutrition, Pain  OT Balance, Cognition, Endurance, Motor, Safety, Pain, Vision, Skin Integrity, Perception  SLP Cognition, Behavior, Safety, Linguistic, Nutrition  TR         Basic ADL's: OT Grooming, Bathing, Dressing, Toileting     Advanced  ADL's: OT       Transfers: PT Bed Mobility, Bed to Chair, Car, Occupational psychologist, Research scientist (life sciences): PT Ambulation, Psychologist, prison and probation services, Stairs     Additional Impairments: OT    SLP Swallowing, Communication, Social Cognition expression Social Interaction, Problem Solving, Memory, Awareness, Attention  TR      Anticipated Outcomes Item Anticipated Outcome  Self Feeding S  Swallowing  Supervision   Basic self-care  S  Toileting  S   Bathroom Transfers S  Bowel/Bladder  min assist  Transfers  supervision using LRAD  Locomotion  supervision using LRAD >250 feet  Communication  Min A  Cognition  Min A  Pain  < 3  Safety/Judgment  min assist   Therapy Plan: PT Intensity: Minimum of 1-2 x/day ,45 to 90 minutes PT Frequency: 5 out of 7 days PT Duration Estimated Length of Stay: 12-16 days OT Intensity: Minimum of 1-2 x/day, 45 to 90 minutes OT Frequency: 5 out of 7 days OT Duration/Estimated Length of Stay: 12-16 days SLP Intensity: Minumum of 1-2 x/day, 30 to 90 minutes SLP Frequency: 3 to 5 out of 7 days SLP Duration/Estimated Length of Stay: 12-16 days   Team Interventions: Nursing Interventions Patient/Family Education, Bladder  Management, Bowel Management, Disease Management/Prevention, Pain Management, Medication Management, Skin Care/Wound Management, Cognitive Remediation/Compensation, Dysphagia/Aspiration Precaution Training, Discharge Planning, Psychosocial Support  PT interventions Ambulation/gait training, Cognitive remediation/compensation, Discharge planning, DME/adaptive equipment instruction, Functional mobility training, Pain management, Splinting/orthotics, Psychosocial support, Therapeutic Activities, UE/LE Strength taining/ROM, Visual/perceptual remediation/compensation, Wheelchair propulsion/positioning, UE/LE Coordination activities, Therapeutic Exercise, Stair training, Skin care/wound management, Patient/family education, Neuromuscular re-education, Functional electrical stimulation, Disease management/prevention, Firefighter, Warden/ranger  OT Interventions Warden/ranger, Discharge planning, Functional electrical stimulation, Pain management, Therapeutic Activities, Self Care/advanced ADL retraining, UE/LE Coordination activities, Visual/perceptual remediation/compensation, Therapeutic Exercise, Skin care/wound managment, Patient/family education, Functional mobility training, Disease mangement/prevention, Cognitive remediation/compensation, Firefighter, Fish farm manager, Neuromuscular re-education, Psychosocial support, Splinting/orthotics, UE/LE Strength taining/ROM, Wheelchair propulsion/positioning  SLP Interventions Cognitive remediation/compensation, Dysphagia/aspiration precaution training, Internal/external aids, Speech/Language facilitation, Financial trader, Environmental controls, Therapeutic Activities, Functional tasks, Patient/family education  TR Interventions    SW/CM Interventions Discharge Planning, Psychosocial Support, Patient/Family Education   Barriers to Discharge MD  Medical stability  Nursing Decreased caregiver  support, Home environment access/layout, Incontinence, Wound Care, Lack of/limited family support, Medication compliance, Behavior Lives alone, 2 level, 4 steps, right rail. Caregiver able to provide min assist 24/7.  PT Home environment access/layout, Incontinence, Behavior, Nutrition means    OT      SLP      SW Decreased caregiver support, Lack of/limited family support Intermittent support only at d/c.   Team Discharge Planning: Destination: PT-Home ,OT-   Home, SLP-Home Projected Follow-up: PT-Outpatient PT, OT-  Home health OT, Outpatient OT, SLP-Home Health SLP, Outpatient SLP, 24 hour supervision/assistance Projected Equipment Needs:  PT-To be determined, OT- 3 in 1 bedside comode, Tub/shower bench, To be determined, SLP-None recommended by SLP Equipment Details: PT- , OT-  TBD Patient/family involved in discharge planning: PT- Patient, Family member/caregiver,  OT-Patient, SLP-Patient, Family member/caregiver  MD ELOS: 13-18 days Medical Rehab Prognosis:  Excellent Assessment: The patient has been admitted for CIR therapies with the diagnosis of left pcomm aneurysm with rupture, craniectomy. The team will be addressing functional mobility, strength, stamina, balance, safety, adaptive techniques and equipment, self-care, bowel and bladder mgt, patient and caregiver education, NMR, cognition, language, swallowing, pain control. Goals have been set at supervision to min assist with self-care,  mobility, cognition, language, and swallowing. Anticipated discharge destination is home with family.        See Team Conference Notes for weekly updates to the plan of care

## 2021-09-15 NOTE — Plan of Care (Addendum)
Behavioral Plan   [place] Level: N/a  Behavior to decrease/ eliminate:  -impulsivity -pulling cortrack -frustration/impatience with staff -lethargy   Changes to environment:  -Lights on, blinds open during the day; off and closed at night -TV off at night -limit visitors to two   Interventions: -encourage daytime arousal -encourage sitting OOB with w/c cushion -Telesitter -Timed toileting -adjust therapy schedule to 10am start time to promote baseline sleep/wake cycle  Recommendations for interactions with patient: -offer toileting and fluids with every interaction -limit night time interruptions as much as possible -provide direct instruction and cues, don't ask "can you..." or "do you want to..."  Attendees: Feliberto Gottron, SLP, Serina Cowper, PT, Kennyth Arnold, RN

## 2021-09-16 LAB — BASIC METABOLIC PANEL
Anion gap: 8 (ref 5–15)
BUN: 25 mg/dL — ABNORMAL HIGH (ref 8–23)
CO2: 17 mmol/L — ABNORMAL LOW (ref 22–32)
Calcium: 9.8 mg/dL (ref 8.9–10.3)
Chloride: 110 mmol/L (ref 98–111)
Creatinine, Ser: 0.67 mg/dL (ref 0.44–1.00)
GFR, Estimated: >60 mL/min (ref >60)
Glucose, Bld: 117 mg/dL — ABNORMAL HIGH (ref 70–99)
Potassium: 4.8 mmol/L (ref 3.5–5.1)
Sodium: 135 mmol/L (ref 135–145)

## 2021-09-16 LAB — GLUCOSE, CAPILLARY
Glucose-Capillary: 133 mg/dL — ABNORMAL HIGH (ref 70–99)
Glucose-Capillary: 120 mg/dL — ABNORMAL HIGH (ref 70–99)
Glucose-Capillary: 107 mg/dL — ABNORMAL HIGH (ref 70–99)
Glucose-Capillary: 131 mg/dL — ABNORMAL HIGH (ref 70–99)
Glucose-Capillary: 94 mg/dL (ref 70–99)

## 2021-09-16 LAB — CBC
HCT: 36 % (ref 36.0–46.0)
Hemoglobin: 12.4 g/dL (ref 12.0–15.0)
MCH: 32.8 pg (ref 26.0–34.0)
MCHC: 34.4 g/dL (ref 30.0–36.0)
MCV: 95.2 fL (ref 80.0–100.0)
Platelets: 362 10*3/uL (ref 150–400)
RBC: 3.78 MIL/uL — ABNORMAL LOW (ref 3.87–5.11)
RDW: 12.3 % (ref 11.5–15.5)
WBC: 8.9 10*3/uL (ref 4.0–10.5)
nRBC: 0 % (ref 0.0–0.2)

## 2021-09-16 MED ORDER — TOPIRAMATE 25 MG PO TABS
25.0000 mg | ORAL_TABLET | Freq: Every day | ORAL | Status: DC
  Administered 2021-09-16 – 2021-09-27 (×12): 25 mg via ORAL
  Filled 2021-09-16 (×12): qty 1

## 2021-09-16 MED ORDER — TOPIRAMATE 25 MG PO TABS
25.0000 mg | ORAL_TABLET | Freq: Once | ORAL | Status: AC
  Administered 2021-09-16: 25 mg via ORAL
  Filled 2021-09-16: qty 1

## 2021-09-16 MED ORDER — METHYLPHENIDATE HCL 5 MG PO TABS
5.0000 mg | ORAL_TABLET | Freq: Two times a day (BID) | ORAL | Status: DC
  Administered 2021-09-16 – 2021-09-28 (×22): 5 mg via ORAL
  Filled 2021-09-16 (×23): qty 1

## 2021-09-16 MED ORDER — FREE WATER
200.0000 mL | Freq: Four times a day (QID) | Status: DC
  Administered 2021-09-16 – 2021-09-19 (×12): 200 mL

## 2021-09-16 MED ORDER — ORAL CARE MOUTH RINSE: 15.0000 mL | Freq: Two times a day (BID) | OROMUCOSAL | Status: DC

## 2021-09-16 MED ORDER — ADULT MULTIVITAMIN W/MINERALS CH
1.0000 | ORAL_TABLET | Freq: Every day | ORAL | Status: DC
  Administered 2021-09-16 – 2021-09-27 (×12): 1 via ORAL
  Filled 2021-09-16 (×13): qty 1

## 2021-09-16 NOTE — Progress Notes (Addendum)
Initial Nutrition Assessment  DOCUMENTATION CODES:   Not applicable  INTERVENTION:   Continue TF via Cortrak: Osmolite 1.5 at 40 ml/h x 12 hours  Prosource TF 45 ml TID  Provides 840 kcal, 63 gm protein, 366 ml free water daily.  Continue Ensure Plus High Protein po BID, each supplement provides 350 kcal and 20 grams of protein. Can give PO if alert, or via tube.   Add MVI with minerals daily.  Magic cup TID with meals, each supplement provides 290 kcal and 9 grams of protein.  If oral intake does not improve within the next few days, recommend providing 100% of nutrition needs via Cortrak.  NUTRITION DIAGNOSIS:   Inadequate oral intake related to lethargy/confusion as evidenced by meal completion < 25%.  GOAL:   Patient will meet greater than or equal to 90% of their needs  MONITOR:   PO intake, Supplement acceptance, Labs, TF tolerance, Skin  REASON FOR ASSESSMENT:   New TF    ASSESSMENT:   66 yo female admitted with subdural hematoma s/p coiling/craniectomy. PMH includes angina, smoker.  Patient unable to provide any nutrition history. She did not wake up during RD visit. Per discussion with RN, patient did not eat any of breakfast today. She did not sleep much last night, but is very sleepy today after multiple medications given last night.   Cortrak in place since 5/1 during acute hospitalization. Currently receiving nocturnal tube feedings of Osmolite 1.5 at 40 ml/h from 6pm to 6am. Rate was decreased to 40 ml/h in hopes of stimulating appetite. Also receiving Prosource TF 45 ml TID. Total intake from TF regimen is 840 kcal, 63 gm protein per day.   Currently on a dysphagia 2 diet with thin liquids. Breakfast tray in the room, 0% eaten. Meal intakes documented at 0-50% since admission to rehab, mostly 0%. Average meal intake 9%. Ensure Enlive/Plus ordered BID per tube which provides an additional 700 kcal and 40 gm protein.   Labs reviewed.  CBG:  133-120  Medications reviewed and include Nutrisource fiber, Novolog, Keppra.  Weight history reviewed.  Patient weighed 81.6 kg on admission to the hospital on 08/29/21. Current weight 76.7 kg. Weight changes related to fluid status/edema during acute care hospitalization.   NUTRITION - FOCUSED PHYSICAL EXAM:  Flowsheet Row Most Recent Value  Orbital Region No depletion  Upper Arm Region Mild depletion  Thoracic and Lumbar Region No depletion  Buccal Region No depletion  Temple Region No depletion  Clavicle Bone Region Mild depletion  Clavicle and Acromion Bone Region Mild depletion  Scapular Bone Region No depletion  Dorsal Hand Unable to assess  Patellar Region No depletion  Anterior Thigh Region Unable to assess  Posterior Calf Region No depletion  Edema (RD Assessment) None  Hair Reviewed  Eyes Unable to assess  Mouth Unable to assess  Skin Reviewed  Nails Reviewed       Diet Order:   Diet Order             DIET DYS 2 Room service appropriate? Yes with Assist; Fluid consistency: Thin  Diet effective now                   EDUCATION NEEDS:   No education needs have been identified at this time  Skin:  Skin Assessment: Skin Integrity Issues: Skin Integrity Issues:: Incisions Incisions: abdomen, head  Last BM:  5/18  Height:   Ht Readings from Last 1 Encounters:  09/12/21 5\' 6"  (1.676 m)  Weight:   Wt Readings from Last 1 Encounters:  09/12/21 76.7 kg    Ideal Body Weight:  59.1 kg  BMI:  Body mass index is 27.29 kg/m.  Estimated Nutritional Needs:   Kcal:  1900-2100  Protein:  100-115 gm  Fluid:  1.9-2.1 L    Gabriel Rainwater RD, LDN, CNSC Please refer to Amion for contact information.

## 2021-09-16 NOTE — Progress Notes (Signed)
Patient didn't sleep last night,only took naps for 10-15 min. For 4 times.Patient confused to time and situation, agitated, impulsive, removing wrist restrains. Patient reoriented, educated, no signs of understanding. Emotional support provided. Patient OOB to recliner, alarm is on.

## 2021-09-16 NOTE — Progress Notes (Signed)
Occupational Therapy TBI Note  Patient Details  Name: Kayla Maynard MRN: [id number] Date of Birth: [birth year]  Today's Date: 09/16/2021 OT Individual Time: 1100-1140 OT Individual Time Calculation (min): 40 min    Short Term Goals: Week 1:  OT Short Term Goal 1 (Week 1): Pt will groom at sink wiht no more than min cuing for seqencing OT Short Term Goal 2 (Week 1): Pt will complete toilet transfers with CGA consistently OT Short Term Goal 3 (Week 1): Pt will orient clothing with no more than min cuing OT Short Term Goal 4 (Week 1): Pt will thread BLE into pants to dmeo improved praxis  Skilled Therapeutic Interventions/Progress Updates:    Pt received supine, fast asleep but easily awoken with increased time. Pt declined ADLs but was agreeable to functional mobility out of the room. She completed bed mobility with (S), mild impulsivity with standing. She completed functional mobility with CGA, 500+ ft without a rest. Poor processing with OT asking if she needed to use the bathroom, stating no, but then initiating transfer into the bathroom. She voided urine and completed toileting tasks with min A overall for clothing management. Pt often with a R sway/bias requiring cueing. She returned to her room and sat EOB. Visual screen performed- possible R peripheral deficits/field cut. She was unable to verbalize any further visual deficits. She transferred to supine. Waist belt and B wrist restraints on. Bed alarm set.   Therapy Documentation Precautions:  Precautions Precautions: Fall Precaution Comments: L crani, bone flap in L abdominal wall Restrictions Weight Bearing Restrictions: No Agitated Behavior Scale: TBI Observation Details Observation Environment: CIR Start of observation period - Date: 09/16/21 Start of observation period - Time: 1100 End of observation period - Date: 09/16/21 End of observation period - Time: 1140 Agitated Behavior Scale (DO NOT LEAVE BLANKS) Short  attention span, easy distractibility, inability to concentrate: Present to a moderate degree Impulsive, impatient, low tolerance for pain or frustration: Present to a moderate degree Uncooperative, resistant to care, demanding: Present to a slight degree Violent and/or threatening violence toward people or property: Absent Explosive and/or unpredictable anger: Absent Rocking, rubbing, moaning, or other self-stimulating behavior: Absent Pulling at tubes, restraints, etc.: Absent Wandering from treatment areas: Absent Restlessness, pacing, excessive movement: Present to a slight degree Repetitive behaviors, motor, and/or verbal: Absent Rapid, loud, or excessive talking: Absent Sudden changes of mood: Absent Easily initiated or excessive crying and/or laughter: Absent Self-abusiveness, physical and/or verbal: Absent Agitated behavior scale total score: 20  Therapy/Group: Individual Therapy  Crissie Reese 09/16/2021, 11:51 AM

## 2021-09-16 NOTE — Progress Notes (Addendum)
Speech Language Pathology TBI Note  Patient Details  Name: Kayla Maynard MRN: [id number] Date of Birth: 04-21-1956  Today's Date: 09/16/2021 SLP Individual Time: 0800-0915 SLP Individual Time Calculation (min): 75 min  Short Term Goals: Week 1: SLP Short Term Goal 1 (Week 1): Patient will utilize external aids for orientation to place, time and situation with Mod A multimodal cues. SLP Short Term Goal 2 (Week 1): Patient will demonstrate sustained attention to functional tasks for 5 minutes with Mod verbal cues for redirection. SLP Short Term Goal 3 (Week 1): Patient will initiate functional tasks in 50% of opportunities with Mod verbal and tactile cues. SLP Short Term Goal 4 (Week 1): Patient will consume current diet with minimal overt s/s of aspiration with Min verbal cues for use of swallowing compensatory strategies. SLP Short Term Goal 5 (Week 1): Patient will verbalize wants/needs at the phrase level with Mod verbal cues for word-finding strategies.  Skilled Therapeutic Interventions: Skilled ST treatment focused on cognitive-communication goals. Pt received sleeping soundly in recliner and awoken with increased time and auditory stimulation. Pt accompanied by nurse who reported pt obtained limited sleep last night. Pt was oriented to place, time, and situation with max A multimodal cues for use of calendar and contextual cues. Pt exhibited frequent pain behaviors by rubbing on head, facial grimacing, and verbalizing "oh God." Nurse and MD aware. Pt distracted by this throughout session with continuous restlessness. Pt made frequent attempts to exit recliner although limited due to posey belt in place. Pt unable to verbalize needs nor respond to SLP asking if she needed to use bathroom, however agreeable to sit on bedside commode where she immediately voided urine. Slp donned new brief. Pt returned to recliner. Refused consumption of breakfast despite multiple attempts and encouragement.  Agreeable to one sip of coffee in which she expressed "that's disgusting." Pt exhibited impulsivity with frequent attempts to exit chair, pounded fist on chair and shouting "no!" with many of SLP's attempts to gently redirect. Pt more and more restless in recliner therefore SLP suggested for pt to get into bed for increased comfort. Pt very agreeable. Pt performed sit-to-stand with CGA and ambulated to bed with sup A. While laying down, Cortrak valve was bumped and resulting in small leakage on pt's gown and bed sheets. Pt moved EOB while SLP facilitated gown change with overall min A for clothing management. At this point, pt requiring mod-to-max A verbal redirection cues due to low frustration tolerance and impatience likely attributed to having to change gown. Pt eventually agreeable to return to semi-reclined position with soft waist belt and wrist restraints in place. Bed alarm set. Continue per SLP POC.     Pain Pain Assessment Pain Scale: Faces Pain Score: 3  Faces Pain Scale: Hurts a little bit Pain Type: Acute pain Pain Location: Head Pain Descriptors / Indicators: Headache Pain Frequency: Intermittent Pain Onset: Gradual Pain Intervention(s): Environmental changes;Repositioned (dimmed lights)  Agitated Behavior Scale: TBI Observation Details Observation Environment: CIR Start of observation period - Date: 09/16/21 Start of observation period - Time: 0800 End of observation period - Date: 09/16/21 End of observation period - Time: 0915 Agitated Behavior Scale (DO NOT LEAVE BLANKS) Short attention span, easy distractibility, inability to concentrate: Present to a moderate degree Impulsive, impatient, low tolerance for pain or frustration: Present to a moderate degree Uncooperative, resistant to care, demanding: Present to a moderate degree Violent and/or threatening violence toward people or property: Absent Explosive and/or unpredictable anger: Present to a slight degree  Rocking,  rubbing, moaning, or other self-stimulating behavior: Present to a moderate degree Pulling at tubes, restraints, etc.: Present to a slight degree Wandering from treatment areas: Absent Restlessness, pacing, excessive movement: Present to a moderate degree Repetitive behaviors, motor, and/or verbal: Absent Rapid, loud, or excessive talking: Absent Sudden changes of mood: Present to a moderate degree Easily initiated or excessive crying and/or laughter: Absent Self-abusiveness, physical and/or verbal: Absent Agitated behavior scale total score: 28  Therapy/Group: Individual Therapy  Tamala Ser 09/16/2021, 9:17 AM

## 2021-09-16 NOTE — Plan of Care (Signed)
  Problem: Safety: ?Goal: Non-violent Restraint(s) ?Outcome: Progressing ?  ?Problem: Consults ?Goal: RH BRAIN INJURY PATIENT EDUCATION ?Description: Description: See Patient Education module for eduction specifics ?Outcome: Progressing ?Goal: Skin Care Protocol Initiated - if Braden Score 18 or less ?Description: If consults are not indicated, leave blank or document N/A ?Outcome: Progressing ?Goal: Nutrition Consult-if indicated ?Outcome: Progressing ?  ?Problem: RH BOWEL ELIMINATION ?Goal: RH STG MANAGE BOWEL WITH ASSISTANCE ?Description: STG Manage Bowel with Min Assistance. ?Outcome: Progressing ?Goal: RH STG MANAGE BOWEL W/MEDICATION W/ASSISTANCE ?Description: STG Manage Bowel with Medication with Min Assistance. ?Outcome: Progressing ?  ?Problem: RH BLADDER ELIMINATION ?Goal: RH STG MANAGE BLADDER WITH ASSISTANCE ?Description: STG Manage Bladder With Min Assistance ?Outcome: Progressing ?Goal: RH STG MANAGE BLADDER WITH MEDICATION WITH ASSISTANCE ?Description: STG Manage Bladder With Medication With Min Assistance. ?Outcome: Progressing ?  ?Problem: RH SKIN INTEGRITY ?Goal: RH STG MAINTAIN SKIN INTEGRITY WITH ASSISTANCE ?Description: STG Maintain Skin Integrity With Min Assistance. ?Outcome: Progressing ?Goal: RH STG ABLE TO PERFORM INCISION/WOUND CARE W/ASSISTANCE ?Description: STG Able To Perform Incision/Wound Care With Min Assistance. ?Outcome: Progressing ?  ?Problem: RH SAFETY ?Goal: RH STG ADHERE TO SAFETY PRECAUTIONS W/ASSISTANCE/DEVICE ?Description: STG Adhere to Safety Precautions With Cues and Reminders. ?Outcome: Progressing ?Goal: RH STG DECREASED RISK OF FALL WITH ASSISTANCE ?Description: STG Decreased Risk of Fall With Min Assistance. ?Outcome: Progressing ?  ?Problem: RH COGNITION-NURSING ?Goal: RH STG USES MEMORY AIDS/STRATEGIES W/ASSIST TO PROBLEM SOLVE ?Description: STG Uses Memory Aids/Strategies With Min Assistance to Problem Solve. ?Outcome: Progressing ?Goal: RH STG ANTICIPATES  NEEDS/CALLS FOR ASSIST W/ASSIST/CUES ?Description: STG Anticipates Needs/Calls for Assist With Cues and Reminders. ?Outcome: Progressing ?  ?Problem: RH PAIN MANAGEMENT ?Goal: RH STG PAIN MANAGED AT OR BELOW PT'S PAIN GOAL ?Description: < 3 on a 0-10 pain scale. ?Outcome: Progressing ?  ?Problem: RH KNOWLEDGE DEFICIT BRAIN INJURY ?Goal: RH STG INCREASE KNOWLEDGE OF SELF CARE AFTER BRAIN INJURY ?Description: Patient will demonstrate knowledge of self-care management, medication/pain management, skin/wound care, dysphagia/thickened liquids management with educational materials and handouts provided by staff independently at discharge. ?Outcome: Progressing ?Goal: RH STG INCREASE KNOWLEDGE OF DYSPHAGIA/FLUID INTAKE ?Description: Patient will demonstrate knowledge of dysphagia diets and thickened liquids with educational materials, handouts, and demonstration provided by staff independently at discharge. ?Outcome: Progressing ?  ?Problem: Consults ?Goal: RH STROKE PATIENT EDUCATION ?Description: See Patient Education module for education specifics  ?Outcome: Progressing ?  ?

## 2021-09-16 NOTE — Progress Notes (Signed)
Physical Therapy Session Note  Patient Details  Name: Kayla Maynard MRN: [id number] Date of Birth: Nov 21, 1955  Today's Date: 09/16/2021 PT Individual Time: 1000-1030 PT Individual Time Calculation (min): 30 min   Short Term Goals: Week 1:  PT Short Term Goal 1 (Week 1): Patient will tolerate >20 min of PT session in a moderately busy environment without behaviors. PT Short Term Goal 2 (Week 1): Patient will perform basic transfers with CGA consistently. PT Short Term Goal 3 (Week 1): Patient will ambulate >150 feet with CGA using LRAD. PT Short Term Goal 4 (Week 1): Patient will improve score on Berg Balance Scale by 7 points, MDC.  Skilled Therapeutic Interventions/Progress Updates:   First session:  Pt presents R sidelying and asleep.  Pt requires increased stim to arouse pt, but then agreeable to therapy.  Pt transferred sidelying to sit w/ CGA after removing wrist and waist restraints.  Pt transfers sit to stand w/ min A and amb multiple trials up to 120' w/o AD.  Pt requires verbal and visual cueing for directions as well as looking for room number upon return to hallway.  Pt returned to bed at conclusion of therapy w/ CGA and remained sidelying.  Re-applied wrist and waist restraints.  Second session:Pt presents left sidelying and agreeable to therapy.  Pt transferred sidelying to sit w/ CGA.  Pt transfers sit to stand w/ min to CGA.  Pt amb around bed to w/c.  Pt amb multiple trials w/ CGA to min A w/o AD.  Pt amb up to 120' per trial including turns to return to seat.  Pt requires encouragement to participate c/o cold and fatigue.  Pt performed standing hooking horseshoes over tall hoop w/ cues for sequencing to alternate colors, and then using B hands simultaneously w/ 30% success.  Pt performed sit to stand transfers holding yellow T-ball 5 x 3 trials w/ CGA.  Pt negotiated 4 steps w/ 2 rails and CGA w/ step-to gait pattern and verbal and visual cues for advancing R UE on descending.   Pt returned to room and transferred sit to supine in bed w/ CGA.  Pt remained left sidelying w/ wrist and waist restraints applied.  Bed alarm on.     Therapy Documentation Precautions:  Precautions Precautions: Fall Precaution Comments: L crani, bone flap in L abdominal wall Restrictions Weight Bearing Restrictions: No General:   Vital Signs:  Pain:no c/o at this time. Pain Assessment Pain Scale: Faces Faces Pain Scale: Hurts a little bit Pain Type: Acute pain Pain Location: Head Pain Descriptors / Indicators: Headache Pain Frequency: Intermittent Pain Onset: Gradual Pain Intervention(s): Environmental changes;Repositioned (dimmed lights) Mobility:       Therapy/Group: Individual Therapy  Lucio Edward 09/16/2021, 12:11 PM

## 2021-09-16 NOTE — Progress Notes (Addendum)
Chaplain responded to consult for Advance Directive. Pt sleeping. Chaplain spoke with pt's nurse, Pamala Duffel who reports pt is alerted to herself, is aware she is in the hospital, and is able to name some of her family members, but does not currently have the cognition to understand the education necessary to complete Advance Directives.   Chaplain called pt's son Ortencia Kick, who was noted on the original consult, to inform him that we will not be able to complete the documentation at this time. He had some indication of this based on a previous conversation with the Child psychotherapist.  Please page as further needs arise.  Maryanna Shape. Carley Hammed, M.Div. [facility] Chaplain Pager [phone] Office [phone]

## 2021-09-16 NOTE — Progress Notes (Addendum)
PROGRESS NOTE   Subjective/Complaints: Pt was up a bit last night. Headache more intense it seems. SLP working with her this morning.   ROS: Limited due to cognitive/behavioral   Objective:   No results found. Recent Labs    09/16/21 0522  WBC 8.9  HGB 12.4  HCT 36.0  PLT 362   Recent Labs    09/15/21 0540 09/16/21 0522  NA 135 135  K 5.5* 4.8  CL 111 110  CO2 18* 17*  GLUCOSE 132* 117*  BUN 36* 25*  CREATININE 0.72 0.67  CALCIUM 9.6 9.8    Intake/Output Summary (Last 24 hours) at 09/16/2021 0920 Last data filed at 09/16/2021 0400 Gross per 24 hour  Intake 565.28 ml  Output --  Net 565.28 ml        Physical Exam: Vital Signs Blood pressure 110/78, pulse 100, temperature (!) 97.5 F (36.4 C), resp. rate 18, height 5\' 6"  (1.676 m), weight 76.7 kg, SpO2 98 %.  Constitutional: No distress . Vital signs reviewed. Appears uncomfortable HEENT: NCAT, EOMI, oral membranes moist Neck: supple Cardiovascular: RRR without murmur. No JVD    Respiratory/Chest: CTA Bilaterally without wheezes or rales. Normal effort    GI/Abdomen: BS +, non-tender, non-distended Ext: no clubbing, cyanosis, or edema Psych: flat, distracted Skin: Crani site CDI. Minimal swelling. Donor site clean and dry also. Few scattered ecchymoses on arms Neuro:  alert, oriented to self.  Follows commands. Told me she goes by "Aurther Lofterry". Unable to ID objects on wall. Language not as spontaneous today.   Moves all 4's fairly equally. No obvious sensory deficits.no resting tone.  Musculoskeletal: no focal jt or trunk pain     Assessment/Plan: 1. Functional deficits which require 3+ hours per day of interdisciplinary therapy in a comprehensive inpatient rehab setting. Physiatrist is providing close team supervision and 24 hour management of active medical problems listed below. Physiatrist and rehab team continue to assess barriers to discharge/monitor  patient progress toward functional and medical goals  Care Tool:  Bathing    Body parts bathed by patient: Right arm, Left arm, Chest, Abdomen, Front perineal area, Buttocks, Right upper leg, Left upper leg, Face   Body parts bathed by helper: Left lower leg, Right lower leg     Bathing assist Assist Level: Moderate Assistance - Patient 50 - 74%     Upper Body Dressing/Undressing Upper body dressing   What is the patient wearing?: Pull over shirt    Upper body assist Assist Level: Minimal Assistance - Patient > 75%    Lower Body Dressing/Undressing Lower body dressing      What is the patient wearing?: Incontinence brief     Lower body assist Assist for lower body dressing: Maximal Assistance - Patient 25 - 49%     Toileting Toileting    Toileting assist Assist for toileting: 2 Helpers     Transfers Chair/bed transfer  Transfers assist     Chair/bed transfer assist level: 2 Helpers     Locomotion Ambulation   Ambulation assist      Assist level: Minimal Assistance - Patient > 75% Assistive device: No Device Max distance: 85 ft   Walk 10  feet activity   Assist     Assist level: Minimal Assistance - Patient > 75% Assistive device: No Device   Walk 50 feet activity   Assist    Assist level: Minimal Assistance - Patient > 75% Assistive device: No Device    Walk 150 feet activity   Assist    Assist level: Minimal Assistance - Patient > 75% Assistive device: No Device    Walk 10 feet on uneven surface  activity   Assist     Assist level: Minimal Assistance - Patient > 75% Assistive device: Other (comment) (R rail)   Wheelchair     Assist Is the patient using a wheelchair?: Yes Type of Wheelchair: Manual    Wheelchair assist level: Dependent - Patient 0% (pt unable to follow cues for propulsion technique)      Wheelchair 50 feet with 2 turns activity    Assist        Assist Level: Dependent - Patient 0%    Wheelchair 150 feet activity     Assist      Assist Level: Dependent - Patient 0%   Blood pressure 110/78, pulse 100, temperature (!) 97.5 F (36.4 C), resp. rate 18, height 5\' 6"  (1.676 m), weight 76.7 kg, SpO2 98 %.  Medical Problem List and Plan: 1. 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             -patient may shower             -ELOS/Goals: 16-22 days, supervision to min assist goals            -Continue CIR therapies including PT, OT, and SLP  2.  Antithrombotics: -DVT/anticoagulation:  Pharmaceutical: Heparin             -antiplatelet therapy: None 3. Pain/headaches: Tylenol, hydrocodone as needed  5/19 - begin topamax  25mg  qhs. Give dose now as well 4. Mood/sleep: Pt's days/nights upside down.             -  sleep chart             -antipsychotic agents: added low dose seroquel tonight for sleep with back up dose             -melatonin 5. Neuropsych: This patient is not capable of making decisions on her own behalf.             -soft wrist restraints for safety/NGT  5/19 -add ritalin for arousal/attention/initiation 6. Skin/Wound Care: Routine skin care checks             -- monitor surgical incisions 7. Fluids/Electrolytes/Nutrition: Intake still minimal -- Dysphagia 3 diet, thin liquids--eating minimal -encourage PO -5/19    -BUN improved to 25 -potassium improved to 4.8, zestril held               --continue Osmolite 1.5 via core track; 40 mL/h x 12 hours (run overnight)    -decreased to help stimulate appetite               -- Continue Prosource, Ensure              --increase h20 flushes to 200cc q6, DC IVF 10: Seizure activity on admission provoked by [facility]:  --continue Keppra --continue Nimotop through 5/21 0000 11: s/p craniectomy: plan cranioplasty in 6-8 weeks   -family does not want helmet- 12. Leukocytosis:  -likely reactive.  Continue to follow,  no s/s infection  -5/19 wbc's improved to 8.9  LOS: 4 days A FACE TO FACE EVALUATION WAS PERFORMED  Ranelle Oyster 09/16/2021, 9:20 AM

## 2021-09-17 DIAGNOSIS — F5101 Primary insomnia: Secondary | ICD-10-CM

## 2021-09-17 DIAGNOSIS — R451 Restlessness and agitation: Secondary | ICD-10-CM

## 2021-09-17 DIAGNOSIS — I610 Nontraumatic intracerebral hemorrhage in hemisphere, subcortical: Secondary | ICD-10-CM

## 2021-09-17 DIAGNOSIS — R569 Unspecified convulsions: Secondary | ICD-10-CM

## 2021-09-17 LAB — GLUCOSE, CAPILLARY
Glucose-Capillary: 101 mg/dL — ABNORMAL HIGH (ref 70–99)
Glucose-Capillary: 102 mg/dL — ABNORMAL HIGH (ref 70–99)
Glucose-Capillary: 103 mg/dL — ABNORMAL HIGH (ref 70–99)
Glucose-Capillary: 108 mg/dL — ABNORMAL HIGH (ref 70–99)
Glucose-Capillary: 119 mg/dL — ABNORMAL HIGH (ref 70–99)
Glucose-Capillary: 134 mg/dL — ABNORMAL HIGH (ref 70–99)

## 2021-09-17 MED ORDER — TRAZODONE HCL 50 MG PO TABS
75.0000 mg | ORAL_TABLET | Freq: Every evening | ORAL | Status: DC | PRN
  Administered 2021-09-17 – 2021-09-22 (×5): 75 mg via ORAL
  Filled 2021-09-17 (×7): qty 2

## 2021-09-17 NOTE — Progress Notes (Signed)
PROGRESS NOTE   Subjective/Complaints: No new complaints this morning Sister is visiting Slept poorly last night Calm when sister is here  ROS: Limited due to cognitive/behavioral   Objective:   No results found. Recent Labs    09/16/21 0522  WBC 8.9  HGB 12.4  HCT 36.0  PLT 362   Recent Labs    09/15/21 0540 09/16/21 0522  NA 135 135  K 5.5* 4.8  CL 111 110  CO2 18* 17*  GLUCOSE 132* 117*  BUN 36* 25*  CREATININE 0.72 0.67  CALCIUM 9.6 9.8    Intake/Output Summary (Last 24 hours) at 09/17/2021 1253 Last data filed at 09/17/2021 0815 Gross per 24 hour  Intake 0 ml  Output --  Net 0 ml        Physical Exam: Vital Signs Blood pressure 122/79, pulse 83, temperature (!) 97.5 F (36.4 C), resp. rate 15, height 5\' 6"  (1.676 m), weight 76.7 kg, SpO2 92 %. Gen: no distress, normal appearing HEENT: oral mucosa pink and moist, NCAT Cardio: Reg rate Chest: normal effort, normal rate of breathing Abd: soft, non-distended Ext: no edema Psych: flat, distracted Skin: Crani site CDI. Minimal swelling. Donor site clean and dry also. Few scattered ecchymoses on arms Neuro:  alert, oriented to self.  Follows commands. Told me she goes by "Coralyn Mark". Unable to ID objects on wall. Language not as spontaneous today.   Moves all 4's fairly equally. No obvious sensory deficits.no resting tone.  Musculoskeletal: no focal jt or trunk pain     Assessment/Plan: 1. Functional deficits which require 3+ hours per day of interdisciplinary therapy in a comprehensive inpatient rehab setting. Physiatrist is providing close team supervision and 24 hour management of active medical problems listed below. Physiatrist and rehab team continue to assess barriers to discharge/monitor patient progress toward functional and medical goals  Care Tool:  Bathing    Body parts bathed by patient: Right arm, Left arm, Chest, Abdomen, Front  perineal area, Buttocks, Right upper leg, Left upper leg, Face   Body parts bathed by helper: Left lower leg, Right lower leg     Bathing assist Assist Level: Moderate Assistance - Patient 50 - 74%     Upper Body Dressing/Undressing Upper body dressing   What is the patient wearing?: Pull over shirt    Upper body assist Assist Level: Minimal Assistance - Patient > 75%    Lower Body Dressing/Undressing Lower body dressing      What is the patient wearing?: Incontinence brief     Lower body assist Assist for lower body dressing: Maximal Assistance - Patient 25 - 49%     Toileting Toileting    Toileting assist Assist for toileting: 2 Helpers     Transfers Chair/bed transfer  Transfers assist     Chair/bed transfer assist level: 2 Helpers     Locomotion Ambulation   Ambulation assist      Assist level: Minimal Assistance - Patient > 75% Assistive device: No Device Max distance: 120   Walk 10 feet activity   Assist     Assist level: Minimal Assistance - Patient > 75% Assistive device: No Device   Walk 50  feet activity   Assist    Assist level: Minimal Assistance - Patient > 75% Assistive device: No Device    Walk 150 feet activity   Assist    Assist level: Minimal Assistance - Patient > 75% Assistive device: No Device    Walk 10 feet on uneven surface  activity   Assist     Assist level: Minimal Assistance - Patient > 75% Assistive device: Other (comment) (R rail)   Wheelchair     Assist Is the patient using a wheelchair?: Yes Type of Wheelchair: Manual    Wheelchair assist level: Dependent - Patient 0% (pt unable to follow cues for propulsion technique)      Wheelchair 50 feet with 2 turns activity    Assist        Assist Level: Dependent - Patient 0%   Wheelchair 150 feet activity     Assist      Assist Level: Dependent - Patient 0%   Blood pressure 122/79, pulse 83, temperature (!) 97.5 F (36.4  C), resp. rate 15, height 5\' 6"  (1.676 m), weight 76.7 kg, SpO2 92 %.  Medical Problem List and Plan: 1. 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             -patient may shower             -ELOS/Goals: 16-22 days, supervision to min assist goals            -Continue CIR therapies including PT, OT, and SLP  2.  Antithrombotics: -DVT/anticoagulation:  Pharmaceutical: Heparin             -antiplatelet therapy: None 3. Headaches: Tylenol, hydrocodone as needed  5/19 - begin topamax  25mg  qhs. Give dose now as well 4. Agitation: much improved, can remove restraints today and reassess need for restraints depending on how she does today.  Pt's days/nights upside down.             -  sleep chart             -antipsychotic agents: added low dose seroquel tonight for sleep with back up dose             -melatonin 5. Neuropsych: This patient is not capable of making decisions on her own behalf.  5/19 -add ritalin for arousal/attention/initiation 6. Skin/Wound Care: Routine skin care checks             -- monitor surgical incisions 7. Fluids/Electrolytes/Nutrition: Intake still minimal -- Dysphagia 3 diet, thin liquids--eating minimal -encourage PO -5/19    -BUN improved to 25 -potassium improved to 4.8, zestril held               --continue Osmolite 1.5 via core track; 40 mL/h x 12 hours (run overnight)    -decreased to help stimulate appetite               -- Continue Prosource, Ensure              --increase h20 flushes to 200cc q6, DC IVF 10: Seizure activity on admission provoked by [facility]:  --continue Keppra --continue Nimotop through 5/21 0000 11: s/p craniectomy: plan cranioplasty in 6-8 weeks   -family does not want helmet- 12. Leukocytosis:  -likely reactive. Continue to follow, no s/s infection  -5/19 wbc's improved to 8.9 13. Insomnia: increase trazodone to 75mg  HS  prn.   LOS: 5 days A FACE  TO FACE EVALUATION WAS PERFORMED  Kayla Maynard 09/17/2021, 12:53 PM

## 2021-09-17 NOTE — Progress Notes (Signed)
Patient remain awake last night. Patient took couple very short naps. Patient remain confused "looking for her sister", agitated from time to time and irritable. Emotional support provided.

## 2021-09-18 LAB — GLUCOSE, CAPILLARY
Glucose-Capillary: 100 mg/dL — ABNORMAL HIGH (ref 70–99)
Glucose-Capillary: 101 mg/dL — ABNORMAL HIGH (ref 70–99)
Glucose-Capillary: 129 mg/dL — ABNORMAL HIGH (ref 70–99)
Glucose-Capillary: 132 mg/dL — ABNORMAL HIGH (ref 70–99)
Glucose-Capillary: 135 mg/dL — ABNORMAL HIGH (ref 70–99)

## 2021-09-18 NOTE — Progress Notes (Signed)
Physical Therapy Session Note  Patient Details  Name: Kayla Maynard MRN: [id number] Date of Birth: 1955/12/01  Today's Date: 09/18/2021 PT Individual Time: 0800-0820 and 1445-1545 PT Individual Time Calculation (min): 20 min and 60 min  Short Term Goals: Week 1:  PT Short Term Goal 1 (Week 1): Patient will tolerate >20 min of PT session in a moderately busy environment without behaviors. PT Short Term Goal 2 (Week 1): Patient will perform basic transfers with CGA consistently. PT Short Term Goal 3 (Week 1): Patient will ambulate >150 feet with CGA using LRAD. PT Short Term Goal 4 (Week 1): Patient will improve score on Berg Balance Scale by 7 points, MDC.  Skilled Therapeutic Interventions/Progress Updates:     Session 1: Patient in bed upon PT arrival. Patient reported a "bad" headache at beginning of session, RN made aware, however, patient refused Tylenol at this time.  Provided therapeutic use of self to assist LPN with med administration and discussed patient's challenges with sleep. Reviewed behavior plan with patient and LPN to review interventions to improve night time sleeping. Behavior plan no longer in patient's room, will update and place on patient's door today. Patient reports she usually goes to bed between 10-11pm and wakes up mid to late morning. Will adjust patient's schedule to reduce early morning sessions. Patient appreciative of efforts to improve sleep quality. Patient declined toileting or mobility at this time due to headache and requested to rest. Informed patient of therapy schedule today and plan to make up missed time this afternoon. Patient agreeable, will need reinforcement due to deficits with recall.   Patient in bed at end of session with breaks locked, bed alarm set, and all needs within reach.   Session 2: Patient in bed asleep with her son and sister in the room upon PT arrival. Patient slow to arouse and agreeable to PT session. Patient reported  headache pain during session, RN made aware. PT provided repositioning, rest breaks, and distraction as pain interventions throughout session.   Patient with nausea following mobility, reports that this occurs following tube feeds and med admin through her [employer]. Nursing made aware and provided anti-emetic medication during session.   Patient's son with several questions about d/c planning, options for placement versus home health aids to attain 24/7 care do to limited caregiver support at d/c. Educated on recommendations for 24/7 care due to safety concerns based on cognitive deficits and high risk of injury in the event of a fall. Discussed family communication and alerting CSW of her son's request for information about guardianship, CSW made aware via secure chat.   Provided education on BI recovery, reviewed patient's behavior plan with the patient and her family. Family appreciative of behavior plan and in agreement with current plan in place.   Therapeutic Activity: Bed Mobility: Patient performed supine to/from sit with min A-supervision. Provided verbal cues and increased time for initiation. Transfers: Patient performed sit to/from stand x3 with supervision-CGA without an AD. Provided verbal cues for initiation and forward weight shfit.  Gait Training:  Patient ambulated >150 feet x2 without an AD with CGA-close supervision. Ambulated with decreased gait speed, decreased step length and height, increased BOS, mild forward trunk lean, and downward head gaze. Provided verbal cues for erect posture, increased step length and height, and encouragement throughout. Patient ascended/descended 8x6" steps using B rails x4 steps and R rail x4 steps with close supervision. Performed reciprocal gait pattern while ascending and step-to gait pattern while descending. Provided cues for safety  awareness.   Patient in bed with family in the room at end of session with breaks locked, no alarm set, as her  son is cleared for transfers in the room, and all needs within reach.   Therapy Documentation Precautions:  Precautions Precautions: Fall Precaution Comments: L crani, bone flap in L abdominal wall Restrictions Weight Bearing Restrictions: No    Therapy/Group: Individual Therapy  Lenah Messenger L Ellia Knowlton PT, DPT  09/18/2021, 6:19 AM

## 2021-09-18 NOTE — Progress Notes (Addendum)
PROGRESS NOTE   Subjective/Complaints: Sleepy this morning. Patient's chart reviewed- No issues reported overnight Vitals signs stable   ROS: Limited due to cognitive/behavioral   Objective:   No results found. Recent Labs    09/16/21 0522  WBC 8.9  HGB 12.4  HCT 36.0  PLT 362   Recent Labs    09/16/21 0522  NA 135  K 4.8  CL 110  CO2 17*  GLUCOSE 117*  BUN 25*  CREATININE 0.67  CALCIUM 9.8    Intake/Output Summary (Last 24 hours) at 09/18/2021 1021 Last data filed at 09/17/2021 1644 Gross per 24 hour  Intake 120 ml  Output --  Net 120 ml        Physical Exam: Vital Signs Blood pressure 136/80, pulse 97, temperature 97.6 F (36.4 C), resp. rate 18, height 5\' 6"  (1.676 m), weight 76.7 kg, SpO2 98 %. Gen: no distress, normal appearing HEENT: oral mucosa pink and moist, NCAT Cardio: Reg rate Chest: normal effort, normal rate of breathing Abd: soft, non-distended Ext: no edema Psych: flat, distracted Skin: Crani site CDI. Minimal swelling. Donor site clean and dry also. Few scattered ecchymoses on arms Neuro:  alert, oriented to self.  Follows commands. Told me she goes by "Kayla Maynard". Unable to ID objects on wall. Language not as spontaneous today.   Moves all 4's fairly equally. No obvious sensory deficits.no resting tone.  Musculoskeletal: no focal jt or trunk pain     Assessment/Plan: 1. Functional deficits which require 3+ hours per day of interdisciplinary therapy in a comprehensive inpatient rehab setting. Physiatrist is providing close team supervision and 24 hour management of active medical problems listed below. Physiatrist and rehab team continue to assess barriers to discharge/monitor patient progress toward functional and medical goals  Care Tool:  Bathing    Body parts bathed by patient: Right arm, Left arm, Chest, Abdomen, Front perineal area, Buttocks, Right upper leg, Left upper leg,  Face   Body parts bathed by helper: Left lower leg, Right lower leg     Bathing assist Assist Level: Moderate Assistance - Patient 50 - 74%     Upper Body Dressing/Undressing Upper body dressing   What is the patient wearing?: Pull over shirt    Upper body assist Assist Level: Minimal Assistance - Patient > 75%    Lower Body Dressing/Undressing Lower body dressing      What is the patient wearing?: Incontinence brief     Lower body assist Assist for lower body dressing: Maximal Assistance - Patient 25 - 49%     Toileting Toileting    Toileting assist Assist for toileting: 2 Helpers     Transfers Chair/bed transfer  Transfers assist     Chair/bed transfer assist level: 2 Helpers     Locomotion Ambulation   Ambulation assist      Assist level: Minimal Assistance - Patient > 75% Assistive device: No Device Max distance: 120   Walk 10 feet activity   Assist     Assist level: Minimal Assistance - Patient > 75% Assistive device: No Device   Walk 50 feet activity   Assist    Assist level: Minimal Assistance - Patient >  75% Assistive device: No Device    Walk 150 feet activity   Assist    Assist level: Minimal Assistance - Patient > 75% Assistive device: No Device    Walk 10 feet on uneven surface  activity   Assist     Assist level: Minimal Assistance - Patient > 75% Assistive device: Other (comment) (R rail)   Wheelchair     Assist Is the patient using a wheelchair?: Yes Type of Wheelchair: Manual    Wheelchair assist level: Dependent - Patient 0% (pt unable to follow cues for propulsion technique)      Wheelchair 50 feet with 2 turns activity    Assist        Assist Level: Dependent - Patient 0%   Wheelchair 150 feet activity     Assist      Assist Level: Dependent - Patient 0%   Blood pressure 136/80, pulse 97, temperature 97.6 F (36.4 C), resp. rate 18, height 5\' 6"  (1.676 m), weight 76.7 kg,  SpO2 98 %.  Medical Problem List and Plan: 1. 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             -patient may shower             -ELOS/Goals: 16-22 days, supervision to min assist goals            -Continue CIR therapies including PT, OT, and SLP  2.  Antithrombotics: -DVT/anticoagulation:  Pharmaceutical: Heparin             -antiplatelet therapy: None 3. Headaches: continue Tylenol, hydrocodone as needed  5/19 - begin topamax  25mg  qhs. Give dose now as well 4. Agitation: much improved, off restraints yesterday and today- maintain off for now as nurse notes presence of restraints makes her more agitated. Will call sister to see if she can spent time with her again today as patient is calmer when someone is in room with her. Pt's days/nights upside down.             -  sleep chart             -antipsychotic agents: added low dose seroquel tonight for sleep with back up dose             -melatonin 5. Neuropsych: This patient is not capable of making decisions on her own behalf.  5/19 -add ritalin for arousal/attention/initiation 6. Skin/Wound Care: Routine skin care checks             -- monitor surgical incisions 7. Fluids/Electrolytes/Nutrition: Intake still minimal -- Dysphagia 3 diet, thin liquids--eating minimal -encourage PO -5/19    -BUN improved to 25 -potassium improved to 4.8, zestril held               --continue Osmolite 1.5 via core track; 40 mL/h x 12 hours (run overnight)    -decreased to help stimulate appetite               -- Continue Prosource, Ensure              --increase h20 flushes to 200cc q6, DC IVF 10: Seizure activity on admission provoked by [facility]:  --continue Keppra --continue Nimotop through 5/21 0000 11: s/p craniectomy: plan cranioplasty in 6-8 weeks   -family does not want helmet- 12. Leukocytosis:  -likely reactive. Continue to  follow, no s/s infection  -5/19 wbc's improved to 8.9 13. Insomnia:  increase trazodone to 75mg  HS prn.   LOS: 6 days A FACE TO FACE EVALUATION WAS PERFORMED  Kayla Maynard 09/18/2021, 10:21 AM

## 2021-09-18 NOTE — Progress Notes (Signed)
SLP Cancellation Note  Patient Details Name: Kayla Maynard MRN: [id number] DOB: 04-23-1956   Cancelled treatment:        Pt sleeping. ST re-attempted x2 and patient refused. She reported a headache. RN notified.                                                                                                 Amil Amen A Markeisha Mancias 09/18/2021, 12:40 PM

## 2021-09-18 NOTE — Progress Notes (Signed)
Pt complaining of nausea. Compazine 10 mg administered via cortrak. Pt tolerated well.  Spoke with son [place] about advance directives and medical POA. Spoke with him about legal guardianship, Cherie PT will contact [facility] SW to consult with him about becoming legal guardian and obtaining medical POA. Chaplain consulted again.

## 2021-09-18 NOTE — Progress Notes (Signed)
Occupational Therapy Session Note  Patient Details  Name: Naleigha Raimondi MRN: [id number] Date of Birth: 07-25-1955  Today's Date: 09/18/2021 OT Individual Time: 1245-8099 OT Individual Time Calculation (min): 69 min    Short Term Goals: Week 1:  OT Short Term Goal 1 (Week 1): Pt will groom at sink wiht no more than min cuing for seqencing OT Short Term Goal 2 (Week 1): Pt will complete toilet transfers with CGA consistently OT Short Term Goal 3 (Week 1): Pt will orient clothing with no more than min cuing OT Short Term Goal 4 (Week 1): Pt will thread BLE into pants to dmeo improved praxis  Skilled Therapeutic Interventions/Progress Updates:    Pt received supine with no c/o pain, agreeable to OT session, her son Ortencia Kick present. Large portion of session spent in patient and family education. Pt's son with several questions re dysphagia diet, need for 24/7 supervision, discharge, OT POC, and CLOF. Provided him with dysphagia 2 handout to ensure compliance with SLP recommendations. Provided stern education on pneumonia and aspiration risk with not following the recommendations. Recommended NT supervision with meals as SLP has not signed off son to supervise meals. He did request to be signed off for in room mobility and given pt at [facility] level, this is appropriate and was done. Reviewed safe handling and he returned education. With her son's encouragement pt was agreeable to take a shower. She required min cueing for sequencing safe doffing of clothing and transfer in/out of walk in shower with a shower chair. OT managed water to ensure her crani incision remained dry. She completed bathing at [facility] level overall. She donned a shirt with (S), assist only for managing the NG tube. She donned pants with mod A to thread over her feet. She ended session with 200 ft of functional mobility with CGA. She reported new pain in her R ankle, medial aspect wrapping around the anterior portion. No signs of redness and  pain present with active and passive plantarflexion. Encouraged pt to ice it and she declined. She was left supine with her family present.   Therapy Documentation Precautions:  Precautions Precautions: Fall Precaution Comments: L crani, bone flap in L abdominal wall Restrictions Weight Bearing Restrictions: No    Therapy/Group: Individual Therapy  Crissie Reese 09/18/2021, 7:33 AM

## 2021-09-19 LAB — CBC
HCT: 37.1 % (ref 36.0–46.0)
Hemoglobin: 12.7 g/dL (ref 12.0–15.0)
MCH: 31.8 pg (ref 26.0–34.0)
MCHC: 34.2 g/dL (ref 30.0–36.0)
MCV: 93 fL (ref 80.0–100.0)
Platelets: 286 10*3/uL (ref 150–400)
RBC: 3.99 MIL/uL (ref 3.87–5.11)
RDW: 11.9 % (ref 11.5–15.5)
WBC: 7.8 10*3/uL (ref 4.0–10.5)
nRBC: 0 % (ref 0.0–0.2)

## 2021-09-19 LAB — BASIC METABOLIC PANEL
Anion gap: 10 (ref 5–15)
BUN: 28 mg/dL — ABNORMAL HIGH (ref 8–23)
CO2: 20 mmol/L — ABNORMAL LOW (ref 22–32)
Calcium: 9.5 mg/dL (ref 8.9–10.3)
Chloride: 104 mmol/L (ref 98–111)
Creatinine, Ser: 0.87 mg/dL (ref 0.44–1.00)
GFR, Estimated: >60 mL/min (ref >60)
Glucose, Bld: 140 mg/dL — ABNORMAL HIGH (ref 70–99)
Potassium: 4.1 mmol/L (ref 3.5–5.1)
Sodium: 134 mmol/L — ABNORMAL LOW (ref 135–145)

## 2021-09-19 LAB — GLUCOSE, CAPILLARY
Glucose-Capillary: 109 mg/dL — ABNORMAL HIGH (ref 70–99)
Glucose-Capillary: 119 mg/dL — ABNORMAL HIGH (ref 70–99)
Glucose-Capillary: 124 mg/dL — ABNORMAL HIGH (ref 70–99)
Glucose-Capillary: 135 mg/dL — ABNORMAL HIGH (ref 70–99)
Glucose-Capillary: 83 mg/dL (ref 70–99)
Glucose-Capillary: 89 mg/dL (ref 70–99)
Glucose-Capillary: 99 mg/dL (ref 70–99)

## 2021-09-19 MED ORDER — MEGESTROL ACETATE 400 MG/10ML PO SUSP
400.0000 mg | Freq: Every day | ORAL | Status: DC
  Administered 2021-09-19 – 2021-09-27 (×9): 400 mg
  Filled 2021-09-19 (×10): qty 10

## 2021-09-19 MED ORDER — FREE WATER
250.0000 mL | Freq: Four times a day (QID) | Status: DC
  Administered 2021-09-19 – 2021-09-20 (×5): 250 mL

## 2021-09-19 NOTE — Progress Notes (Signed)
Speech Language Pathology Daily Session Note  Patient Details  Name: Kayla Maynard MRN: [id number] Date of Birth: 05/14/1955  Today's Date: 09/19/2021 SLP Individual Time: 1400-1500 SLP Individual Time Calculation (min): 60 min  Short Term Goals: Week 1: SLP Short Term Goal 1 (Week 1): Patient will utilize external aids for orientation to place, time and situation with Mod A multimodal cues. SLP Short Term Goal 2 (Week 1): Patient will demonstrate sustained attention to functional tasks for 5 minutes with Mod verbal cues for redirection. SLP Short Term Goal 3 (Week 1): Patient will initiate functional tasks in 50% of opportunities with Mod verbal and tactile cues. SLP Short Term Goal 4 (Week 1): Patient will consume current diet with minimal overt s/s of aspiration with Min verbal cues for use of swallowing compensatory strategies. SLP Short Term Goal 5 (Week 1): Patient will verbalize wants/needs at the phrase level with Mod verbal cues for word-finding strategies.  Skilled Therapeutic Interventions: Skilled treatment session focused on cognitive and dysphagia goals. Upon arrival, patient was awake and alert and remained engaged throughout session. Patient also remained calm and cooperative without any issues with frustration tolerance. SLP facilitated session by administering the [facility] Mental Status Examination (SLUMS). Patient scored  11/30 points with a score of 27 or above considered normal. Patient demonstrated deficits in short-term recall, problem solving and verbal fluency. Patient participated in a functional conversation that focused on recall of events from previous therapy sessions with extra time needed for word-finding. SLP also facilitated session by providing trials of Dys. 3 textures. Patient demonstrated efficient mastication with complete oral clearance without overt s/s of aspiration, therefore, recommend patient upgrade to Dys. 3 textures in hopes of maximizing  patient's overall PO intake. Patient left upright in bed with alarm on and all needs within reach. Continue with current plan of care.       Pain Pain Assessment Pain Scale: 0-10 Pain Score: 0-No pain  Therapy/Group: Individual Therapy  Roma Bondar 09/19/2021, 3:05 PM

## 2021-09-19 NOTE — Progress Notes (Signed)
PROGRESS NOTE   Subjective/Complaints: Pt wide awake in bed. Said she was feeling well and that headache much better.   ROS: Limited due to cognitive/behavioral   Objective:   No results found. Recent Labs    09/19/21 0519  WBC 7.8  HGB 12.7  HCT 37.1  PLT 286   Recent Labs    09/19/21 0519  NA 134*  K 4.1  CL 104  CO2 20*  GLUCOSE 140*  BUN 28*  CREATININE 0.87  CALCIUM 9.5   No intake or output data in the 24 hours ending 09/19/21 1126       Physical Exam: Vital Signs Blood pressure 106/68, pulse 93, temperature 98.5 F (36.9 C), resp. rate 18, height 5\' 6"  (1.676 m), weight 76.7 kg, SpO2 96 %. Constitutional: No distress . Vital signs reviewed. HEENT: NCAT, EOMI, oral membranes moist, NGT Neck: supple Cardiovascular: RRR without murmur. No JVD    Respiratory/Chest: CTA Bilaterally without wheezes or rales. Normal effort    GI/Abdomen: BS +, non-tender, non-distended Ext: no clubbing, cyanosis, or edema Psych: pleasant, confused Skin: Crani site CDI. Minimal swelling. Donor site clean and dry also. Few scattered ecchymoses on arms Neuro:  alert, oriented to self.  Follows commands. Language of confusion today, confabulates. Said she went to [place] and [place] this weekend.  Moves all 4's fairly equally. No obvious sensory deficits.no resting tone.  Musculoskeletal: Full ROM, No pain with AROM or PROM in the neck, trunk, or extremities. Posture appropriate      Assessment/Plan: 1. Functional deficits which require 3+ hours per day of interdisciplinary therapy in a comprehensive inpatient rehab setting. Physiatrist is providing close team supervision and 24 hour management of active medical problems listed below. Physiatrist and rehab team continue to assess barriers to discharge/monitor patient progress toward functional and medical goals  Care Tool:  Bathing    Body parts bathed by patient: Right  arm, Left arm, Chest, Abdomen, Front perineal area, Buttocks, Right upper leg, Left upper leg, Face   Body parts bathed by helper: Left lower leg, Right lower leg     Bathing assist Assist Level: Moderate Assistance - Patient 50 - 74%     Upper Body Dressing/Undressing Upper body dressing   What is the patient wearing?: Pull over shirt    Upper body assist Assist Level: Minimal Assistance - Patient > 75%    Lower Body Dressing/Undressing Lower body dressing      What is the patient wearing?: Incontinence brief     Lower body assist Assist for lower body dressing: Maximal Assistance - Patient 25 - 49%     Toileting Toileting    Toileting assist Assist for toileting: 2 Helpers     Transfers Chair/bed transfer  Transfers assist     Chair/bed transfer assist level: 2 Helpers     Locomotion Ambulation   Ambulation assist      Assist level: Minimal Assistance - Patient > 75% Assistive device: No Device Max distance: 120   Walk 10 feet activity   Assist     Assist level: Minimal Assistance - Patient > 75% Assistive device: No Device   Walk 50 feet activity  Assist    Assist level: Minimal Assistance - Patient > 75% Assistive device: No Device    Walk 150 feet activity   Assist    Assist level: Minimal Assistance - Patient > 75% Assistive device: No Device    Walk 10 feet on uneven surface  activity   Assist     Assist level: Minimal Assistance - Patient > 75% Assistive device: Other (comment) (R rail)   Wheelchair     Assist Is the patient using a wheelchair?: Yes Type of Wheelchair: Manual    Wheelchair assist level: Dependent - Patient 0% (pt unable to follow cues for propulsion technique)      Wheelchair 50 feet with 2 turns activity    Assist        Assist Level: Dependent - Patient 0%   Wheelchair 150 feet activity     Assist      Assist Level: Dependent - Patient 0%   Blood pressure 106/68,  pulse 93, temperature 98.5 F (36.9 C), resp. rate 18, height 5\' 6"  (1.676 m), weight 76.7 kg, SpO2 96 %.  Medical Problem List and Plan: 1. 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             -patient may shower             -ELOS/Goals: 16-22 days, supervision to min assist goals            -Continue CIR therapies including PT, OT, and SLP  2.  Antithrombotics: -DVT/anticoagulation:  Pharmaceutical: Heparin             -antiplatelet therapy: None 3. Headaches: continue Tylenol, hydrocodone as needed  5/22 -continue topamax  25mg  qhs. Headaches better 4. Agitation: much improved, off restraints yesterday and today- maintain off for now as nurse notes presence of restraints makes her more agitated. Will call sister to see if she can spent time with her again today as patient is calmer when someone is in room with her. Pt's days/nights upside down.             -  sleep chart             -antipsychotic agents: added low dose seroquel tonight for sleep with back up dose             -melatonin 5. Neuropsych: This patient is not capable of making decisions on her own behalf.  5/22 -added ritalin for arousal/attention/initiation--improved   -still quite confused however 6. Skin/Wound Care: Routine skin care checks             -- monitor surgical incisions 7. Fluids/Electrolytes/Nutrition: Intake still minimal -- Dysphagia 3 diet, thin liquids--eating minimal -encourage PO -5/22    -BUN sl worsened to 28 -potassium improved to 4.1, zestril held               --continue Osmolite 1.5 via core track; 40 mL/h x 12 hours (run overnight)    -still not eating enough               -- Continue Prosource, Ensure              --increase h20 flushes to 240cc q6   -dc TF   -megace for appetite 10: Seizure activity on admission provoked by [facility]:  --continue Keppra --continue Nimotop  completed 11: s/p craniectomy: plan cranioplasty in 6-8 weeks   -family does not want helmet- 12.  Leukocytosis:  -resolved 13. Insomnia: increased trazodone to 75mg  HS prn.   LOS: 7 days A FACE TO FACE EVALUATION WAS PERFORMED  Kayla Maynard 09/19/2021, 11:26 AM

## 2021-09-19 NOTE — Progress Notes (Signed)
Physical Therapy Session Note  Patient Details  Name: Lenola Lockner MRN: [id number] Date of Birth: 04-30-1956  Today's Date: 09/19/2021 PT Individual Time: 1300-1400 PT Individual Time Calculation (min): 60 min   Short Term Goals: Week 1:  PT Short Term Goal 1 (Week 1): Patient will tolerate >20 min of PT session in a moderately busy environment without behaviors. PT Short Term Goal 2 (Week 1): Patient will perform basic transfers with CGA consistently. PT Short Term Goal 3 (Week 1): Patient will ambulate >150 feet with CGA using LRAD. PT Short Term Goal 4 (Week 1): Patient will improve score on Berg Balance Scale by 7 points, MDC.  Skilled Therapeutic Interventions/Progress Updates:     Patient in bed talking to her aunt upon PT arrival. Patient alert and agreeable to PT session. Patient reported 3.5/10 headache during session, RN made aware. PT provided repositioning, rest breaks, and distraction as pain interventions throughout session.   Therapeutic Activity: Bed Mobility: Patient performed supine to/from sit with mod I with min use of hospital bed features.  Transfers: Patient performed sit to/from stand x8 with supervision-mod I. Provided verbal cues for looking and reaching back before sitting for safety x2.  Gait Training:  Patient ambulated >50 feet x4 without an AD with CGA-close supervision. Ambulated with decreased gait speed, decreased step length and height, increased BOS, mild forward trunk lean, and downward head gaze. Provided verbal cues for erect posture, increased step length and height, and encouragement throughout. Patient ascended/descended 1 flight of steps (11 steps) in the stairwell using R rail with close supervision for safety. Performed reciprocal gait pattern throughout. Provided min cues for safety awareness.   Neuromuscular Re-ed: Berg Balance Test Sit to Stand: Able to stand without using hands and stabilize independently Standing Unsupported: Able to  stand safely 2 minutes Sitting with Back Unsupported but Feet Supported on Floor or Stool: Able to sit safely and securely 2 minutes Stand to Sit: Sits safely with minimal use of hands Transfers: Able to transfer safely, minor use of hands Standing Unsupported with Eyes Closed: Able to stand 10 seconds safely Standing Ubsupported with Feet Together: Able to place feet together independently and stand 1 minute safely From Standing, Reach Forward with Outstretched Arm: Can reach confidently >25 cm (10") From Standing Position, Pick up Object from Floor: Able to pick up shoe safely and easily From Standing Position, Turn to Look Behind Over each Shoulder: Looks behind from both sides and weight shifts well Turn 360 Degrees: Able to turn 360 degrees safely but slowly Standing Unsupported, Alternately Place Feet on Step/Stool: Able to complete 4 steps without aid or supervision Standing Unsupported, One Foot in Front: Able to plae foot ahead of the other independently and hold 30 seconds Standing on One Leg: Tries to lift leg/unable to hold 3 seconds but remains standing independently Total Score: 48/56 (7/57 on 5/16) Patient demonstrated increased fall risk noted by score of 7/56 on the Berg Balance Scale.  <45/56 = fall risk, <42/56 = predictive of recurrent falls, <40/56 = 100% fall risk  >41 = independent, 21-40 = assistive device, 0-20 = wheelchair level  MDC 6.9 (4 pts 45-56, 5 pts 35-44, 7 pts 25-34) (ANPTA Core Set of Outcome Measures for Adults with Neurologic Conditions, 2018) Functional Gait  Assessment Gait Level Surface: Walks 20 ft, slow speed, abnormal gait pattern, evidence for imbalance or deviates 10-15 in outside of the 12 in walkway width. Requires more than 7 sec to ambulate 20 ft. Change in Gait  Speed: Makes only minor adjustments to walking speed, or accomplishes a change in speed with significant gait deviations, deviates 10-15 in outside the 12 in walkway width, or changes  speed but loses balance but is able to recover and continue walking. Gait with Horizontal Head Turns: Performs head turns with moderate changes in gait velocity, slows down, deviates 10-15 in outside 12 in walkway width but recovers, can continue to walk. Gait with Vertical Head Turns: Performs task with moderate change in gait velocity, slows down, deviates 10-15 in outside 12 in walkway width but recovers, can continue to walk. Gait and Pivot Turn: Turns slowly, requires verbal cueing, or requires several small steps to catch balance following turn and stop Step Over Obstacle: Is able to step over one shoe box (4.5 in total height) but must slow down and adjust steps to clear box safely. May require verbal cueing. Gait with Narrow Base of Support: Ambulates less than 4 steps heel to toe or cannot perform without assistance. Gait with Eyes Closed: Walks 20 ft, slow speed, abnormal gait pattern, evidence for imbalance, deviates 10-15 in outside 12 in walkway width. Requires more than 9 sec to ambulate 20 ft. Ambulating Backwards: Walks 20 ft, slow speed, abnormal gait pattern, evidence for imbalance, deviates 10-15 in outside 12 in walkway width. Steps: Alternating feet, must use rail. Total Score: 10/30 (0/30 on 5/16) Patient demonstrates increased fall risk as noted by score of 10/30 on  Functional Gait Assessment.   <22/30 = predictive of falls, <20/30 = fall in 6 months, <18/30 = predictive of falls in PD MCID: 5 points stroke population, 4 points geriatric population (ANPTA Core Set of Outcome Measures for Adults with Neurologic Conditions, 2018) Five times Sit to Stand Test (FTSS) Method: Use a straight back chair with a solid seat that is 17-18" high. Ask participant to sit on the chair with arms folded across their chest.   Instructions: "Stand up and sit down as quickly as possible 5 times, keeping your arms folded across your chest."   Measurement: Stop timing when the participant  touches the chair in sitting the 5th time. TIME: 20 sec (unable to complete test on 5/16) Cut off scores indicative of increased fall risk: >12 sec CVA, >16 sec PD, >13 sec vestibular (ANPTA Core Set of Outcome Measures for Adults with Neurologic Conditions, 2018)  Patient in bed handed off to Proctor, SLP, at end of session.   Therapy Documentation Precautions:  Precautions Precautions: Fall Precaution Comments: L crani, bone flap in L abdominal wall Restrictions Weight Bearing Restrictions: No    Therapy/Group: Individual Therapy  Mysti Haley L Jinger Middlesworth PT, DPT  09/19/2021, 4:35 PM

## 2021-09-19 NOTE — Progress Notes (Signed)
Occupational Therapy Note  Patient Details  Name: Kayla Maynard MRN: [id number] Date of Birth: Feb 27, 1956  Today's Date: 09/19/2021 OT Missed Time: 75 Minutes Missed Time Reason: Patient unwilling/refused to participate without medical reason;Patient fatigue  Patient greeted semi-reclined in bed with lights off. Pt stated she was not going to participate in therapy. OT provided pt different options for participation, but she continued to refuse despite max encouragement. Patient stated "there is nothing you could do to get me out of this bed right now." OT will follow up per plan of care.    Merlene Laughter Jahziel Sinn 09/19/2021, 3:53 PM

## 2021-09-20 LAB — GLUCOSE, CAPILLARY
Glucose-Capillary: 109 mg/dL — ABNORMAL HIGH (ref 70–99)
Glucose-Capillary: 116 mg/dL — ABNORMAL HIGH (ref 70–99)
Glucose-Capillary: 99 mg/dL (ref 70–99)

## 2021-09-20 MED ORDER — ENSURE ENLIVE PO LIQD
237.0000 mL | Freq: Two times a day (BID) | ORAL | Status: DC
  Administered 2021-09-21: 237 mL via ORAL

## 2021-09-20 MED ORDER — LEVETIRACETAM 100 MG/ML PO SOLN
1000.0000 mg | Freq: Two times a day (BID) | ORAL | Status: DC
  Administered 2021-09-20 – 2021-09-23 (×6): 1000 mg via ORAL
  Filled 2021-09-20 (×6): qty 10

## 2021-09-20 MED ORDER — LEVETIRACETAM 500 MG PO TABS: 1000.0000 mg | ORAL_TABLET | Freq: Two times a day (BID) | ORAL | Status: DC

## 2021-09-20 NOTE — Progress Notes (Signed)
Occupational Therapy Session Note  Patient Details  Name: Sharica Roedel MRN: [id number] Date of Birth: 01-20-1956  Today's Date: 09/20/2021 OT Individual Time: 1100-1200 OT Individual Time Calculation (min): 60 min   Short Term Goals: Week 1:  OT Short Term Goal 1 (Week 1): Pt will groom at sink wiht no more than min cuing for seqencing OT Short Term Goal 2 (Week 1): Pt will complete toilet transfers with CGA consistently OT Short Term Goal 3 (Week 1): Pt will orient clothing with no more than min cuing OT Short Term Goal 4 (Week 1): Pt will thread BLE into pants to dmeo improved praxis  Skilled Therapeutic Interventions/Progress Updates:   Patient greeted sidelying in bed. Pt needed encouragement, but eventually agreeable to shower today. Pt ambulated to the cabinet with CGA and collected clothing. She then ambulated into bathroom and needed verbal cues for safety with undressing and CGA for balance. OT placed shower cap to cover head incision, then pt needed cues throughout shower for safe technique. She stood for 75% of shower with no LOB, but needed cues for pt to sit back down to wash feet. Dressing completed seated on tub bench in shower with supervision for safety. Pt took rest break stating her head was hurting and needed encouragement to try to don socks using figure 4 position. Pt eventually agreeable to was able to don socks. Pt more agitated with therapist for asking her to do things on her own. Pt ambulated to the recliner and left seated in recliner with alarm belt on, call bell in reach, and family present.   Therapy Documentation Precautions:  Precautions Precautions: Fall Precaution Comments: L crani, bone flap in L abdominal wall Restrictions Weight Bearing Restrictions: No Pain:  Denies pain   Therapy/Group: Individual Therapy  Mal Amabile 09/20/2021, 12:19 PM

## 2021-09-20 NOTE — Progress Notes (Signed)
Patient ID: Kayla Maynard, female   DOB: 02-Nov-1955, 66 y.o.   MRN: [id number]  SW met with pt, [name], and pt BIL in room to provide updates from team conference, and d/c date 5/31. SW continued to reiterate 24/7 care recommended due to safety reasons. Pt sister reports pt will have support that is needed. If outpatient, prefers [facility].   [id number] spoke with [name] to provide updates from team conference, and d/c date 5/31. He was upset as he shared that he had not received updates. SW reminded him that he was not excluded from updates, pt sister was in the room when updates were provided and he was being called as of now to provide updates. Continues to voice frustration about not being able to provide 24/7 care. SW reminded him on the options he had such as Consulting civil engineer and/or possibly [name] who is indicating she can help. He states she is not the primary one to make decisions and would only be able to help as she can. SW shared limited resources available beyond what has been offered.  Sw reiterated 24/7 care is recommended due to safety reasons. Primarily fixated on knowing when the bone will be put in his mother's skull and would like to know if the procedure could occur prior to her discharge. SW shared will relay concerns to attending, and provided him with contact information for Dr. Windy Carina office so he could schedule his mother's follow-up appointment since there is now a d/c date. SW shared there will be more updates next week as well.   SW informed attending on pt concerns.   Loralee Pacas, MSW, [place] Office: [phone] Cell: [phone] Fax: [phone]

## 2021-09-20 NOTE — Patient Care Conference (Signed)
Inpatient RehabilitationTeam Conference and Plan of Care Update Date: 09/20/2021   Time: 10:09 AM    Patient Name: Kayla Maynard      Medical Record Number: [id number]  Date of Birth: 09-26-1955 Sex: Female         Room/Bed: 4W16C/4W16C-01 Payor Info: Payor: MEDICARE / Plan: MEDICARE PART A AND B / Product Type: *No Product type* /    Admit Date/Time:  09/12/2021  2:29 PM  Primary Diagnosis:  ICH (intracerebral hemorrhage) [facility])  Hospital Problems: Principal Problem:   ICH (intracerebral hemorrhage) [facility])    Expected Discharge Date: Expected Discharge Date: 09/28/21  Team Members Present: Physician leading conference: Dr. Faith Rogue Social Worker Present: Cecile Sheerer, LCSWA Nurse Present: Kennyth Arnold, RN PT Present: Serina Cowper, PT OT Present: Blanch Media, OT SLP Present: Feliberto Gottron, SLP PPS Coordinator present : Fae Pippin, SLP     Current Status/Progress Goal Weekly Team Focus  Bowel/Bladder   continent b/b  maintain continence  toilet as needed   Swallow/Nutrition/ Hydration   Dys. 3 textures with thin liquids, Full supervision for safety and encouragement for PO intake  Supervision  tolerance of current diet, trials of regular textures   ADL's   CGA mobility, mod A LB ADLs, (S) UB ADLs. Still impulsive with poor safety awareness and orientation  supervision  balance, cognition, awareness, functional mobility, ADL retraining   Mobility   CGA-supervision overall, gait >200 feet, 11 steps 1 rail  supervision-mod I  balance, activity tolerance, stimulation tolerance, functional mobility, gait and stair training, safety awareness, community integration, dual task training, patient/caregiver education   Communication   Min A for word-finding at the converation level  Min A  use of word-finding strategies   Safety/Cognition/ Behavioral Observations  Mod A  Min A  recall with use of strategies, functional problem solving, emergent awareness    Pain   no report of pain  remain pain free  assess pain q 4hr and prn   Skin   surgical incisions  no new breakdown  assess skin q shift and prn     Discharge Planning:  Pt lives alone and will only have intermittent support in the evening. No family to provide care during the day.   Team Discussion: Confused and displaying language of confusion. Added Ritalin and Megace. Tube out today. Continue to push fluids. Headache improved. Continent B/B, no reports of pain. Incisions healing with no s/s infection or breakdown. Will not have 24/7 care at discharge. Family only available to check in during evening hours. Has 2 family members visiting often, will check their availability.   Patient on target to meet rehab goals: yes, supervision to mod I goals. Currently CGA mobility, transfers. Dys 3, thin liquid diet. Poor memory, extra time needed for processing.  *See Care Plan and progress notes for long and short-term goals.   Revisions to Treatment Plan:  Adjusting medications.   Teaching Needs: Family education, medication management, skin/wound care, transfer/gait training, etc.   Current Barriers to Discharge: Decreased caregiver support, Wound care, and Lack of/limited family support  Possible Resolutions to Barriers: Family education Family provide needed supervision      Medical Summary Current Status: improved arousal, headaches improved. ngt removed today. language of confusion  Barriers to Discharge: Medical stability   Possible Resolutions to [employer] Focus: daily assessment of labs, moving to po diet, pain control   Continued Need for Acute Rehabilitation Level of Care: The patient requires daily medical management by a physician with specialized  training in physical medicine and rehabilitation for the following reasons: Direction of a multidisciplinary physical rehabilitation program to maximize functional independence : Yes Medical management of patient  stability for increased activity during participation in an intensive rehabilitation regime.: Yes Analysis of laboratory values and/or radiology reports with any subsequent need for medication adjustment and/or medical intervention. : Yes   I attest that I was present, lead the team conference, and concur with the assessment and plan of the team.   Tennis Must 09/20/2021, 2:15 PM

## 2021-09-20 NOTE — Progress Notes (Signed)
Orthopedic Tech Progress Note Patient Details:  Kayla Maynard [birth year] [id number]  Called in order to [employer] for a HELMET   Patient ID: Kayla Maynard, female   DOB: 06/03/1955, 67 y.o.   MRN: [id number]  [name] 09/20/2021, 6:22 PM

## 2021-09-20 NOTE — Progress Notes (Signed)
Physical Therapy Weekly Progress Note  Patient Details  Name: Kayla Maynard MRN: [id number] Date of Birth: 09/18/1955  Beginning of progress report period: Sep 13, 2021 End of progress report period: Sep 20, 2021  Today's Date: 09/20/2021 PT Individual Time: 1435-1530 PT Individual Time Calculation (min): 55 min   Patient has met 4 of 4 short term goals.  Patient with great progress this week. Performing all mobility at CGA-supervision level without an AD, continues to demonstrate impulsivity, decreased safety awareness, decreased word finding, limited orientation, and reduced stimulation tolerance limiting safety with mobility. Progressed outcome measure scores: Berg Balance Scale: 7/48 on 5/16; 48/56 on 5/22 Functional Gait Assessment: 0/30 on 5/16; 10/30 on 5/22 Five Time Sit to Stand: 0 score on 5/16; 20 sec on 5/22 6 Min Walk Test: unable on 5/16; 1044 ft on 5/23 Initiated verbal family education on BI behaviors, behavior plan, and strategies for management of behaviors and increased oral intake. Will continue family education as family is available and with scheduled time prior to d/c.   Patient continues to demonstrate the following deficits decreased cardiorespiratoy endurance, decreased attention, decreased awareness, decreased problem solving, decreased safety awareness, decreased memory, and delayed processing, and decreased standing balance, decreased postural control, decreased balance strategies, and difficulty maintaining precautions and therefore will continue to benefit from skilled PT intervention to increase functional independence with mobility.  Patient progressing toward long term goals.  Continue plan of care.  PT Short Term Goals Week 1:  PT Short Term Goal 1 (Week 1): Patient will tolerate >20 min of PT session in a moderately busy environment without behaviors. PT Short Term Goal 1 - Progress (Week 1): Met PT Short Term Goal 2 (Week 1): Patient will perform basic  transfers with CGA consistently. PT Short Term Goal 2 - Progress (Week 1): Met PT Short Term Goal 3 (Week 1): Patient will ambulate >150 feet with CGA using LRAD. PT Short Term Goal 3 - Progress (Week 1): Met PT Short Term Goal 4 (Week 1): Patient will improve score on Berg Balance Scale by 7 points, MDC. PT Short Term Goal 4 - Progress (Week 1): Met Week 2:  PT Short Term Goal 1 (Week 2): STG=LTG due to ELOS.  Skilled Therapeutic Interventions/Progress Updates:     Patient in bed upon PT arrival. Patient alert and agreeable to PT session. Patient denied pain during session, reports mild headache that continues to improve.   Patient oriented to self and location today, decreased recall of situation and time today. Discussed activities the patient enjoys throughout session, patient reports gardening and swimming being her top 2 hobbies. Educated on risk associated with swimming and restrictions on this until MD clears her following her second surgery, patient upset, but stated understanding, will need reinforcement.   Therapeutic Activity: Bed Mobility: Patient performed supine to/from sit with mod I with HOB slightly elevated.  Transfers: Patient performed sit to/from stand with supervision throughout session without AD. Provided verbal cues for safety awareness intermittent due to impulsivity.  Gait Training:  Patient ambulated >100 feet x2 without an AD with CGA to close supervision guarding on the L for safety due to craniotomy. Ambulated with decreased gait speed, decreased step length and height, increased BOS, mild forward trunk lean, and downward head gaze. Provided verbal cues for erect posture, increased step length and height, and encouragement throughout. 6 Min Walk Test:  Instructed patient to ambulate as quickly and as safely as possible for 6 minutes using LRAD. Patient was allowed to  take standing rest breaks without stopping the test, but if the patient required a sitting rest  break the clock would be stopped and the test would be over.  Results: 1044 feet (318.2 meters, Avg speed 0.9 m/s) without an AD with close supervision. Results indicate that the patient has reduced endurance with ambulation compared to age matched norms.  Age Matched Norms: 54-69 yo F: 538 meters MDC: 58.21 meters (190.98 feet) or 50 meters (ANPTA Core Set of Outcome Measures for Adults with Neurologic Conditions, 2018)  Neuromuscular Re-ed: Patient performed the following dual task training activities: -ambulating forwards and backwards 20 feet tossing a ball back and forth x2 with +2 assist -ambulating forwards and backwards 20 feet tossing a ball back and forth with +2 assist while naming animals, able to name 2 without cues and 3 with mod-max cues -ambulating forwards and backwards 20 feet tossing a ball back and forth with +2 assist counting by 2's x2 (able to due correctly and quickly in sitting), accurate stating next number with each toss of the ball while ambulating forwards, increased difficulty with self correction of errors x1 and 4 additional errors with stopping to think while ambulating backwards, similar performance on second trial Educated patient on dual task training, causes of challenges with activities, patient demonstrated good awareness of deficits, but unable to strategize how to improve the task.  Patient in bed at end of session with breaks locked, bed alarm set, Telesitter in place, and all needs within reach.   Therapy Documentation Precautions:  Precautions Precautions: Fall Precaution Comments: L crani, bone flap in L abdominal wall Restrictions Weight Bearing Restrictions: No   Therapy/Group: Individual Therapy  Lubertha Leite L Jarquis Walker PT, DPT  09/20/2021, 4:40 PM

## 2021-09-20 NOTE — Progress Notes (Signed)
PROGRESS NOTE   Subjective/Complaints: Pt up in chair. Finished breakfast. York SpanielSaid she was full and ate her breakfast. Had one pancake left on plate.   ROS: Patient denies fever, rash, sore throat, blurred vision, dizziness, nausea, vomiting, diarrhea, cough, shortness of breath or chest pain, joint or back/neck pain, headache, or mood change.   Objective:   No results found. Recent Labs    09/19/21 0519  WBC 7.8  HGB 12.7  HCT 37.1  PLT 286   Recent Labs    09/19/21 0519  NA 134*  K 4.1  CL 104  CO2 20*  GLUCOSE 140*  BUN 28*  CREATININE 0.87  CALCIUM 9.5    Intake/Output Summary (Last 24 hours) at 09/20/2021 1003 Last data filed at 09/20/2021 0900 Gross per 24 hour  Intake 340 ml  Output --  Net 340 ml         Physical Exam: Vital Signs Blood pressure 113/77, pulse (!) 101, temperature 98.3 F (36.8 C), resp. rate 20, height 5\' 6"  (1.676 m), weight 76.7 kg, SpO2 99 %. Constitutional: No distress . Vital signs reviewed. HEENT: NCAT, EOMI, oral membranes moist Neck: supple Cardiovascular: RRR without murmur. No JVD    Respiratory/Chest: CTA Bilaterally without wheezes or rales. Normal effort    GI/Abdomen: BS +, non-tender, non-distended Ext: no clubbing, cyanosis, or edema Psych: pleasant and cooperative  Skin: Crani site CDI. Minimal swelling. Donor site clean and dry also. Few scattered ecchymoses on arms Neuro:  alert, oriented to self.  Follows commands. Language more contextual. still confabulates.   Moves all 4's fairly equally. No obvious sensory deficits.no resting tone. Good standing balance Musculoskeletal: Full ROM, No pain with AROM or PROM in the neck, trunk, or extremities. Posture appropriate      Assessment/Plan: 1. Functional deficits which require 3+ hours per day of interdisciplinary therapy in a comprehensive inpatient rehab setting. Physiatrist is providing close team supervision  and 24 hour management of active medical problems listed below. Physiatrist and rehab team continue to assess barriers to discharge/monitor patient progress toward functional and medical goals  Care Tool:  Bathing    Body parts bathed by patient: Right arm, Left arm, Chest, Abdomen, Front perineal area, Buttocks, Right upper leg, Left upper leg, Face   Body parts bathed by helper: Left lower leg, Right lower leg     Bathing assist Assist Level: Moderate Assistance - Patient 50 - 74%     Upper Body Dressing/Undressing Upper body dressing   What is the patient wearing?: Pull over shirt    Upper body assist Assist Level: Minimal Assistance - Patient > 75%    Lower Body Dressing/Undressing Lower body dressing      What is the patient wearing?: Incontinence brief     Lower body assist Assist for lower body dressing: Maximal Assistance - Patient 25 - 49%     Toileting Toileting    Toileting assist Assist for toileting: 2 Helpers     Transfers Chair/bed transfer  Transfers assist     Chair/bed transfer assist level: 2 Helpers     Locomotion Ambulation   Ambulation assist      Assist level: Minimal Assistance -  Patient > 75% Assistive device: No Device Max distance: 120   Walk 10 feet activity   Assist     Assist level: Minimal Assistance - Patient > 75% Assistive device: No Device   Walk 50 feet activity   Assist    Assist level: Minimal Assistance - Patient > 75% Assistive device: No Device    Walk 150 feet activity   Assist    Assist level: Minimal Assistance - Patient > 75% Assistive device: No Device    Walk 10 feet on uneven surface  activity   Assist     Assist level: Minimal Assistance - Patient > 75% Assistive device: Other (comment) (R rail)   Wheelchair     Assist Is the patient using a wheelchair?: Yes Type of Wheelchair: Manual    Wheelchair assist level: Dependent - Patient 0% (pt unable to follow cues for  propulsion technique)      Wheelchair 50 feet with 2 turns activity    Assist        Assist Level: Dependent - Patient 0%   Wheelchair 150 feet activity     Assist      Assist Level: Dependent - Patient 0%   Blood pressure 113/77, pulse (!) 101, temperature 98.3 F (36.8 C), resp. rate 20, height 5\' 6"  (1.676 m), weight 76.7 kg, SpO2 99 %.  Medical Problem List and Plan: 1. 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            -patient may shower            -ELOS/Goals: 16-22 days, supervision to min assist goals            -Continue CIR therapies including PT, OT, and SLP. Interdisciplinary team conference today to discuss goals, barriers to discharge, and dc planning.   2.  Antithrombotics: -DVT/anticoagulation:  Pharmaceutical: Heparin             -antiplatelet therapy: None 3. Headaches: continue Tylenol, hydrocodone as needed  5/23 -continue topamax  25mg  qhs. Headaches better 4. Agitation: much improved, off restraints yesterday and today- maintain off for now as nurse notes presence of restraints makes her more agitated. Will call sister to see if she can spent time with her again today as patient is calmer when someone is in room with her. Pt's days/nights upside down.             -  sleep chart             -antipsychotic agents: added low dose seroquelfor sleep with back up dose             -melatonin 5. Neuropsych: This patient is not capable of making decisions on her own behalf.   -ritalin for arousal/attention/initiation--improved   -still quite confused however 6. Skin/Wound Care: Routine skin care checks             -- dc staples today 7. Fluids/Electrolytes/Nutrition: Intake still minimal -- Dysphagia 3 diet, thin liquids--eating minimal -encourage PO - BUN sl worsened to 28 -potassium improved to 4.1, zestril held                --wants NG out, po picking up   -dc NGT today   -megace for appetite   -push fluids   --recheck BMET 5/24 10: Seizure activity on admission provoked by [facility]:  --continue Keppra --continue Nimotop completed 11: s/p craniectomy: plan cranioplasty in 6-8  weeks   -family does not want helmet- 12. Leukocytosis:  -resolved 13. Insomnia: increased trazodone to 75mg  HS prn.   LOS: 8 days A FACE TO FACE EVALUATION WAS PERFORMED  09/20/2021, 10:03 AM

## 2021-09-20 NOTE — Progress Notes (Signed)
Physical Therapy TBI Note  Patient Details  Name: Kayla Maynard MRN: [id number] Date of Birth: 1955-05-06  Today's Date: 09/20/2021 PT Individual Time: 1000-1028 PT Individual Time Calculation (min): 28 min   Short Term Goals: Week 2:  PT Short Term Goal 1 (Week 2): STG=LTG due to ELOS.  Skilled Therapeutic Interventions/Progress Updates:     Pt received supine in bed asleep. Easily roused and agrees to therapy. No complaint of pain. Supine to sit with cues for sequencing and positioning. Pt performs sit to stand with CGA and cues for initiation. Pt ambulates x250' with CGA and cues for navigation, upright gaze to improve posture and balance, and with PT providing balance challenges including looking R and looking L. Pt has increased postural sway with head turns but no overt LOBs. Pt takes seated rest break. On next bout of ambulation, PT engages pt in conversation regarding interests and asks multiple questions. Pt has notable decrease in gait speed and increased in postural sway when multitasking. Pt takes seated rest break prior to ambulating final bout of 250'. Pt left supine with alarm intact and all needs within reach.  Therapy Documentation Precautions:  Precautions Precautions: Fall Precaution Comments: L crani, bone flap in L abdominal wall Restrictions Weight Bearing Restrictions: No   Therapy/Group: Individual Therapy  Beau Fanny, PT, DPT 09/20/2021, 5:45 PM

## 2021-09-20 NOTE — Progress Notes (Signed)
Speech Language Pathology Weekly Progress and Session Note  Patient Details  Name: Kayla Maynard MRN: [id number] Date of Birth: [birth year]  Beginning of progress report period: Sep 12, 2021 End of progress report period: Sep 20, 2021  Today's Date: 09/20/2021 SLP Individual Time: 1332-1430 SLP Individual Time Calculation (min): 58 min  Short Term Goals: Week 1: SLP Short Term Goal 1 (Week 1): Patient will utilize external aids for orientation to place, time and situation with Mod A multimodal cues. SLP Short Term Goal 1 - Progress (Week 1): Met SLP Short Term Goal 2 (Week 1): Patient will demonstrate sustained attention to functional tasks for 5 minutes with Mod verbal cues for redirection. SLP Short Term Goal 2 - Progress (Week 1): Met SLP Short Term Goal 3 (Week 1): Patient will initiate functional tasks in 50% of opportunities with Mod verbal and tactile cues. SLP Short Term Goal 3 - Progress (Week 1): Met SLP Short Term Goal 4 (Week 1): Patient will consume current diet with minimal overt s/s of aspiration with Min verbal cues for use of swallowing compensatory strategies. SLP Short Term Goal 4 - Progress (Week 1): Met SLP Short Term Goal 5 (Week 1): Patient will verbalize wants/needs at the phrase level with Mod verbal cues for word-finding strategies. SLP Short Term Goal 5 - Progress (Week 1): Met    New Short Term Goals: Week 2: SLP Short Term Goal 1 (Week 2): STGs=LTGs due to ELOS  Weekly Progress Updates: Patient has made excellent gains and has met 5 of 5 STGs this reporting period. Patient is demonstrating improved arousal, alertness, and overall engagement with minimal verbal agitation/frustration noted. Patient can express her basic wants/needs at the phrase and sentence level but requires extra time for word-finding. Patient also demonstrates improved cognitive functioning with improved orientation, initiation and attention. However, patient continues to demonstrate  moderate impairments in recall and problem solving. Patient's diet has been upgraded to Dys. 3 textures with thin liquids with minimal overt s/s of aspiration observed but ongoing cues needed for overall PO intake. Patient and family education ongoing. Patient would benefit from continued skilled SLP intervention to maximize her cognitive-linguistic and swallowing function prior to discharge.      Intensity: Minumum of 1-2 x/day, 30 to 90 minutes Frequency: 3 to 5 out of 7 days Duration/Length of Stay:09/28/21 Treatment/Interventions: Cognitive remediation/compensation;Dysphagia/aspiration precaution training;Internal/external aids;Speech/Language facilitation;Cueing hierarchy;Environmental controls;Therapeutic Activities;Functional tasks;Patient/family education   Daily Session  Skilled Therapeutic Interventions: Skilled treatment session focused on dysphagia and cognitive goals. Upon arrival, patient was eating her lunch of Dys. 3 textures that her family members had brought. Patient reported mild frustration due to still having NG tube in place. Nursing made aware and pulled the tube (per physician orders). Patient proceeded to finish her frosty. No overt s/s of aspiration noted throughout meal. Recommend patient continue current diet. While eating, patient participating in a mildly abstract conversation that focused on events from previous therapy sessions with mild word-finding deficits noted. Patient required extra time and overall Min-Mod A multimodal cues. SLP also facilitated session by providing overall supervision level verbal cues for reading comprehension at the word level and for reading comprehension of functional information. Overall, patient remained awake, alert and cooperative throughout session. Patient left upright in bed with alarm on and all needs within reach. Continue with current plan of care.       Pain Pain Assessment Pain Score: 0-No pain  Therapy/Group:  Co-Treatment  Kamin Niblack 09/20/2021, 6:23 AM

## 2021-09-20 NOTE — Progress Notes (Signed)
Cortrak and staples removed. Pt tolerated both well. No complication or pain noted. All bed alarms on call light within reach, all needs met.   Dayna Ramus

## 2021-09-21 LAB — BASIC METABOLIC PANEL
Anion gap: 11 (ref 5–15)
BUN: 28 mg/dL — ABNORMAL HIGH (ref 8–23)
CO2: 21 mmol/L — ABNORMAL LOW (ref 22–32)
Calcium: 9.6 mg/dL (ref 8.9–10.3)
Chloride: 102 mmol/L (ref 98–111)
Creatinine, Ser: 0.96 mg/dL (ref 0.44–1.00)
GFR, Estimated: >60 mL/min (ref >60)
Glucose, Bld: 95 mg/dL (ref 70–99)
Potassium: 3.7 mmol/L (ref 3.5–5.1)
Sodium: 134 mmol/L — ABNORMAL LOW (ref 135–145)

## 2021-09-21 MED ORDER — ENSURE ENLIVE PO LIQD
237.0000 mL | Freq: Three times a day (TID) | ORAL | Status: DC
  Administered 2021-09-21 – 2021-09-27 (×16): 237 mL via ORAL

## 2021-09-21 NOTE — Progress Notes (Signed)
PROGRESS NOTE   Subjective/Complaints: No new issues overnight. Spoke to son at length yesterday. He would like for her to have helmet after our discussion.   ROS: Limited due to cognitive/behavioral   Objective:   No results found. Recent Labs    09/19/21 0519  WBC 7.8  HGB 12.7  HCT 37.1  PLT 286   Recent Labs    09/19/21 0519 09/21/21 0523  NA 134* 134*  K 4.1 3.7  CL 104 102  CO2 20* 21*  GLUCOSE 140* 95  BUN 28* 28*  CREATININE 0.87 0.96  CALCIUM 9.5 9.6    Intake/Output Summary (Last 24 hours) at 09/21/2021 0853 Last data filed at 09/20/2021 2000 Gross per 24 hour  Intake 840 ml  Output --  Net 840 ml         Physical Exam: Vital Signs Blood pressure 121/83, pulse 84, temperature 97.6 F (36.4 C), resp. rate 18, height 5\' 6"  (1.676 m), weight 76.7 kg, SpO2 100 %. Constitutional: No distress . Vital signs reviewed. HEENT: NCAT, EOMI, oral membranes moist Neck: supple Cardiovascular: RRR without murmur. No JVD    Respiratory/Chest: CTA Bilaterally without wheezes or rales. Normal effort    GI/Abdomen: BS +, non-tender, non-distended Ext: no clubbing, cyanosis, or edema Psych: pleasant but confused Skin: Crani site CDI. Minimal swelling. Donor site clean and dry also. Few scattered ecchymoses on arms Neuro:  alert, oriented to self.  Follows commands. Language more contextual. still confabulates frequently.   Moves all 4's fairly equally. No obvious sensory deficits.no resting tone.Improved standing balance Musculoskeletal: Full ROM, No pain with AROM or PROM in the neck, trunk, or extremities. Posture appropriate      Assessment/Plan: 1. Functional deficits which require 3+ hours per day of interdisciplinary therapy in a comprehensive inpatient rehab setting. Physiatrist is providing close team supervision and 24 hour management of active medical problems listed below. Physiatrist and rehab team  continue to assess barriers to discharge/monitor patient progress toward functional and medical goals  Care Tool:  Bathing    Body parts bathed by patient: Right arm, Left arm, Chest, Abdomen, Front perineal area, Buttocks, Right upper leg, Left upper leg, Face   Body parts bathed by helper: Left lower leg, Right lower leg     Bathing assist Assist Level: Moderate Assistance - Patient 50 - 74%     Upper Body Dressing/Undressing Upper body dressing   What is the patient wearing?: Pull over shirt    Upper body assist Assist Level: Minimal Assistance - Patient > 75%    Lower Body Dressing/Undressing Lower body dressing      What is the patient wearing?: Incontinence brief     Lower body assist Assist for lower body dressing: Maximal Assistance - Patient 25 - 49%     Toileting Toileting    Toileting assist Assist for toileting: 2 Helpers     Transfers Chair/bed transfer  Transfers assist     Chair/bed transfer assist level: 2 Helpers     Locomotion Ambulation   Ambulation assist      Assist level: Minimal Assistance - Patient > 75% Assistive device: No Device Max distance: 120   Walk  10 feet activity   Assist     Assist level: Minimal Assistance - Patient > 75% Assistive device: No Device   Walk 50 feet activity   Assist    Assist level: Minimal Assistance - Patient > 75% Assistive device: No Device    Walk 150 feet activity   Assist    Assist level: Minimal Assistance - Patient > 75% Assistive device: No Device    Walk 10 feet on uneven surface  activity   Assist     Assist level: Minimal Assistance - Patient > 75% Assistive device: Other (comment) (R rail)   Wheelchair     Assist Is the patient using a wheelchair?: Yes Type of Wheelchair: Manual    Wheelchair assist level: Dependent - Patient 0% (pt unable to follow cues for propulsion technique)      Wheelchair 50 feet with 2 turns activity    Assist         Assist Level: Dependent - Patient 0%   Wheelchair 150 feet activity     Assist      Assist Level: Dependent - Patient 0%   Blood pressure 121/83, pulse 84, temperature 97.6 F (36.4 C), resp. rate 18, height 5\' 6"  (1.676 m), weight 76.7 kg, SpO2 100 %.  Medical Problem List and Plan: 1. 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            -patient may shower            -ELOS/Goals: 5/31, supervision to min assist goals            -Continue CIR therapies including PT, OT, and SLP   -spoke to son. Described her needs and time frame for flap. Will schedule appt with NS to discuss. Helmet ordered for safety.  2.  Antithrombotics: -DVT/anticoagulation:  Pharmaceutical: Heparin             -antiplatelet therapy: None 3. Headaches: continue Tylenol, hydrocodone as needed  5/24 -continue topamax  25mg  qhs. Headaches better 4. Agitation: much improved, off restraints yesterday and today- maintain off for now as nurse notes presence of restraints makes her more agitated. Will call sister to see if she can spent time with her again today as patient is calmer when someone is in room with her. Pt's days/nights upside down.             -  sleep chart             -antipsychotic agents: added low dose seroquelfor sleep with back up dose             -melatonin 5. Neuropsych: This patient is not capable of making decisions on her own behalf.   -ritalin for arousal/attention/initiation--improved   -still with cognitive linguistic issues 6. Skin/Wound Care: Routine skin care checks             -- dc staples today 7. Fluids/Electrolytes/Nutrition: Intake still minimal -- Dysphagia 3 diet, thin liquids--eating minimal -encourage PO - BUN sl  stable at 28 -potassium improved 3.7   -NG out   -megace for appetite, intake variable yesterday   -push fluids   --recheck BMET  5/25 10: Seizure activity on admission provoked by [facility]:  --continue Keppra --continue Nimotop completed 11: s/p craniectomy: plan cranioplasty in 6-8 weeks     12. Leukocytosis:  -resolved 13. Insomnia: increased trazodone to 75mg  HS prn.   LOS: 9 days A FACE  TO FACE EVALUATION WAS PERFORMED  Ranelle Oyster 09/21/2021, 8:53 AM

## 2021-09-21 NOTE — Progress Notes (Signed)
Occupational Therapy Weekly Progress Note  Patient Details  Name: Kayla Maynard MRN: [id number] Date of Birth: 05-26-1955  Beginning of progress report period: Sep 13, 2021 End of progress report period: Sep 21, 2021  Today's Date: 09/21/2021 OT Individual Time: 1050-1200 OT Individual Time Calculation (min): 70 min    Patient has met 4 of 4 short term goals.  Pt has made excellent progress this reporting period improving to supervision level for transfers and overall min-Supervision for ADL tasks. Pt continues to be limited by expressive deficits, visual perceptual deficits, decreased balance and poor awareness of deficits impacting her safety with BADLs. Pt family is still working on finding 24/7 supervision at home prior to DC  Patient continues to demonstrate the following deficits: muscle weakness, decreased cardiorespiratoy endurance, decreased visual perceptual skills, decreased attention to right, decreased attention, decreased awareness, decreased problem solving, decreased safety awareness, and decreased memory, and decreased sitting balance, decreased standing balance, decreased postural control, and decreased balance strategies and therefore will continue to benefit from skilled OT intervention to enhance overall performance with BADL, iADL, and Reduce care partner burden.  Patient progressing toward long term goals..  Continue plan of care.  OT Short Term Goals Week 1:  OT Short Term Goal 1 (Week 1): Pt will groom at sink wiht no more than min cuing for seqencing OT Short Term Goal 1 - Progress (Week 1): Met OT Short Term Goal 2 (Week 1): Pt will complete toilet transfers with CGA consistently OT Short Term Goal 2 - Progress (Week 1): Met OT Short Term Goal 3 (Week 1): Pt will orient clothing with no more than min cuing OT Short Term Goal 3 - Progress (Week 1): Met OT Short Term Goal 4 (Week 1): Pt will thread BLE into pants to dmeo improved praxis OT Short Term Goal 4 -  Progress (Week 1): Met Week 2:  OT Short Term Goal 1 (Week 2): STG = LTG d/t ELOS  Skilled Therapeutic Interventions/Progress Updates:     Pt received in bathroom with NT with no pain   Therapeutic activity Focus of session on IADL retraining, standing balance, activity tolerance, and functional cognition within functional tasks. All completed with supervision for mobility.   - gathering towels around apartment with pt using gross body movements to assist with scanning, placing in basket and folding and placing on hangers -making the bed with pt not following command to put the navy side up - sweeping the floor with swiffer to move colored blocks in to piles with 5# ankle weights - reaching +step up task with CGA for step up with 5# ankle weights overhead  Pt completes kitchen search into various height cabinets/appliaces in prep for IADL retraining from ambulatory level with no cuing for safety/positioning throughout environment.  Pt completes kitchen safety assessment searching/remedying for 5 unsafe situations in kitchen may encounter (knife on edge of counter, cup on floor, water running in sink, cabinet door open, and burner left on).   Pt able to identify 5 /5 situations with min cuing for visual . Activity completed for safety awareness and kitchen navigation. Pt education on reason each scenario is unsafe/risks.    Pt left at end of session in bed with exit alarm on, call light in reach and all needs met   Therapy Documentation Precautions:  Precautions Precautions: Fall Precaution Comments: L crani, bone flap in L abdominal wall Restrictions Weight Bearing Restrictions: No General:   Therapy/Group: Individual Therapy  Tonny Branch 09/21/2021, 6:54 AM

## 2021-09-21 NOTE — Progress Notes (Signed)
Nutrition Follow-up  DOCUMENTATION CODES:   Not applicable  INTERVENTION:   Continue Multivitamin w/ minerals daily Discontinue ProSource TF Increase Ensure Enlive to po TID, each supplement provides 350 kcal and 20 grams of protein. Magic cup BID with meals, each supplement provides 290 kcal and 9 grams of protein  NUTRITION DIAGNOSIS:   Inadequate oral intake related to lethargy/confusion as evidenced by meal completion < 25%.  GOAL:   Patient will meet greater than or equal to 90% of their needs  MONITOR:   PO intake, Supplement acceptance, Labs, Skin  REASON FOR ASSESSMENT:   New TF    ASSESSMENT:   66 yo female admitted with subdural hematoma s/p coiling/craniectomy. PMH includes angina, smoker.  5/22 - diet advanced to Dysphagia 3 5/23 - Cortrak removed  Pt resting in bed, family at bedside.   Pt reports that her appetite is good and she wants food, but has been having a hard time finding foods that are good and she likes. States that she does not really eat meat.  Discussed ONS with pt, pt reports that she has only received 1 Ensure since she has been admitted to CIR. Based on chart review Ensure was ordered per tube versus PO.   Per EMR, pt PO intake includes: 5/20: Dinner 25% 5/22: Breakfast 50%, Dinner 30% 5/23: Breakfast 10%, Lunch 75%, Dinner 10%  Pt reports to RD that she is constipated and that she has never had any issues with her bowels before. Discussed with pt that we can trial medications for constipation if needed. Based on pt chart, pt had a bowel movement yesterday. Will continue to monitor for need of bowel regimen.  Medications reviewed and include: Nutrisource Fiber, Megace, MVI Labs reviewed: Sodium 134, BUN 28  Diet Order:   Diet Order             DIET DYS 3 Room service appropriate? Yes; Fluid consistency: Thin  Diet effective now                   EDUCATION NEEDS:   No education needs have been identified at this  time  Skin:  Skin Assessment: Skin Integrity Issues: Skin Integrity Issues:: Incisions Incisions: abdomen, head  Last BM:  5/23  Height:   Ht Readings from Last 1 Encounters:  09/12/21 5\' 6"  (1.676 m)    Weight:   Wt Readings from Last 1 Encounters:  09/12/21 76.7 kg    Ideal Body Weight:  59.1 kg  BMI:  Body mass index is 27.29 kg/m.  Estimated Nutritional Needs:  Kcal:  1900-2100 Protein:  100-115 gm Fluid:  1.9-2.1 L   09/14/21 RD, LDN Clinical Dietitian See [facility] for contact information.

## 2021-09-21 NOTE — Progress Notes (Signed)
Physical Therapy Session Note  Patient Details  Name: Kayla Maynard MRN: [id number] Date of Birth: 02-21-1956  Today's Date: 09/21/2021 PT Individual Time: 1420-1540 PT Individual Time Calculation (min): 80 min   Short Term Goals: Week 2:  PT Short Term Goal 1 (Week 2): STG=LTG due to ELOS.  Skilled Therapeutic Interventions/Progress Updates:     Patient in bed with family in the room upon PT arrival. Patient alert and agreeable to PT session. Patient denied pain during session.  Discussed family's concerns with d/c plan and date due to limited caregiver support at d/c >20 min. Educated on reasons for need of 24/7 supervision and limitations in placement at another facility. Family is very concerned about finding 24/7 care for the patient, however, reports understanding of need for this for patient's safety at d/c.   Focused session on cognitive and visual scanning tasks with functional mobility for dual task training throughout session.   Donned helmet during session. Educated patient on purpose of wearing the helmet and patient in agreement without concerns throughout session.   Therapeutic Activity: Bed Mobility: Patient performed supine to/from sit independently in a flat bed without use of bed rails.  Transfers: Patient performed sit to/from stand with supervision throughout session without AD. Provided verbal cues for safety awareness x1 due to impulsivity.   Gait Training:  Patient ambulated >100 feet to/from the main therapy gym x2 without an AD with CGA to close supervision, instructed patient to locate the gym and her room each trial, required x2 cues on first trial and no cues on second trial.   Neuromuscular Re-ed: Patient performed the following cognitive and visual scanning dual task activities: -scanning for 10 colored dots placed and various levels on the walls, R/L, x20 feet, retrieved 9/10 with one pass -scanning for 10 sticky notes ordered 1-10 placed as above in  scrambled order, instructed to retrieve the numbers in ascending order, patient required >8 min to complete the task and >10 passes and mod cues for staying within the boundaries of the "search" area; she did retrieve the numbers in order self initiation the strategy to place the last number found on top to remind her what number she was looking for, she did not seem to recall where numbers were located when it was time to look for them and would often walk faster than she would scan, causer her to miss numbers and perform more passes Patient able to identify the increased challenge of the second task and problem solved how she could improve her success with mod cues.  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   Therapy Documentation Precautions:  Precautions Precautions: Fall Precaution Comments: L crani, bone flap in L abdominal wall Restrictions Weight Bearing Restrictions: No    Therapy/Group: Individual Therapy  Chinelo Benn L Cambridge Deleo PT, DPT  09/21/2021, 5:10 PM

## 2021-09-21 NOTE — Progress Notes (Signed)
   09/21/21 1615  Clinical Encounter Type  Visited With Patient  Visit Type Initial;Other (Comment) (Advanced Directive)  Referral From Nurse  Consult/Referral To Chaplain   Chaplain responded to a spiritual consult request for advanced directive education.  Patient was not interested in any of the process. I advised the patient that if she changed her mind we would be happy to come back. I asked the patient, Kayla Maynard, is there was anything I could do that might be helpful. She thanked me for coming but said she needed to rest.   [facility]  [phone]

## 2021-09-21 NOTE — Progress Notes (Signed)
Speech Language Pathology Daily Session Note  Patient Details  Name: Kayla Maynard MRN: [id number] Date of Birth: 11-23-1955  Today's Date: 09/21/2021 SLP Individual Time: [id number] SLP Individual Time Calculation (min): 60 min  Short Term Goals: Week 2: SLP Short Term Goal 1 (Week 2): STGs=LTGs due to ELOS  Skilled Therapeutic Interventions: Skilled treatment session focused on dysphagia and cognitive goals. Upon arrival, patient was asleep in bed but easily awakened. Patient reported she did not eat her breakfast because it was cold but was agreeable to consuming several "snacks." Patient consumed Dys. 3 textures with thin liquids without overt s/s of aspiration. Recommend patient continue current diet. Throughout functional conversation, patient with frequent word-finding difficulty. SLP provided session by providing education regarding word-finding strategies. Patient verbalized understanding but required Mod verbal cues for utilization. Patient also participated in a generative naming task with Mod-Max verbal cues needed for utilization of strategies. Patient left upright in bed with alarm on and all needs within reach. Continue with current plan of care.      Pain No/Denies Pain   Therapy/Group: Individual Therapy  Jaevion Goto 09/21/2021, 12:41 PM

## 2021-09-22 LAB — BASIC METABOLIC PANEL
Anion gap: 10 (ref 5–15)
BUN: 19 mg/dL (ref 8–23)
CO2: 23 mmol/L (ref 22–32)
Calcium: 9.3 mg/dL (ref 8.9–10.3)
Chloride: 102 mmol/L (ref 98–111)
Creatinine, Ser: 1.03 mg/dL — ABNORMAL HIGH (ref 0.44–1.00)
GFR, Estimated: >60 mL/min (ref >60)
Glucose, Bld: 111 mg/dL — ABNORMAL HIGH (ref 70–99)
Potassium: 4 mmol/L (ref 3.5–5.1)
Sodium: 135 mmol/L (ref 135–145)

## 2021-09-22 NOTE — Progress Notes (Signed)
Patient ID: Kayla Maynard, female   DOB: 1955-12-18, 66 y.o.   MRN: [id number]  SW received updates from nursing that pt family has concerns about discharge. Provided contact information for pt DIL Melany [phone].  1040am- SW spoke with [facility] to answer questions related to discharge. She reported that there was a lot of misinformation. SW recalled history of conversations with her husband prior to admission to CIR, and prior to team conference updates. SW shared reasons 24/7 care is required (I.e. impulsivity, cognition, and brain flap not present yet). Continues to report there is not 24/7 care at home and asked what were other options. SW shared hired caregivers and/or family. SW discussed if there was any support and reports it is very limited as well. SW discusses SNF placement process and pt not appropriate, however, can send out referral. SW reiterated being unsure on how long insurance will approved for pt to be in SNF. She reports that she will discuss everything with her husband and will follow-up.   1127- SW received phone call from pt DIL Melany and she indicated after speaking with her husband, they do not want SNF placement, and intend to have a family meeting this weekend to discuss in more detail. Reports they are pursuing guardianship and a letter supporting she is not able to make decisions would help. SW informed will ask attending.   SW provided medical team with above updates, and asked if a letter would be appropriate. SW waiting on follow-up.   SW sent out SNF referral in the event there are changes after family meeting that is scheduled for this weekend. NCPASRR# [id number]. SNF referral sent out.   Cecile Sheerer, MSW, LCSWA Office: [phone] Cell: [phone] Fax: [phone]

## 2021-09-22 NOTE — Progress Notes (Signed)
Occupational Therapy Session Note  Patient Details  Name: Houda Brau MRN: [id number] Date of Birth: November 15, 1955  Today's Date: 09/22/2021 OT Individual Time: 1102-1200 OT Individual Time Calculation (min): 58 min    Short Term Goals: Week 2:  OT Short Term Goal 1 (Week 2): STG = LTG d/t ELOS  Skilled Therapeutic Interventions/Progress Updates:    Patient received side-lying in bed with no c/o of pain and excited to participate in therapy. Pt transferred from bed > bathroom (S) and completed all bathroom care independently. Pt completed functional mobility to day gym. Pt stood and instructed to throw corresponding colored bean bags to the corresponding colored pad on the floor. Pt stood with good balance and twisted behind to a bucket to grab bean bags. Maintained good dynamic standing balance while throwing bags. Pt requested to head to the bathroom for a BM. NT told of BM. Functional mobility completed to rehab gym (S) to challenge memory, spatial awareness, visual acuity. Pt provided a picture of 3 peg designs and copied onto board. Pt demonstrated good spatial awareness. Required re-direction on the first for correct color and placement on peg board. Able to self-correct with verbal cues. Challenged pt dynamic balance on biodex completing 3 different activities starting on static and increasing to level 10. Pt reported she did not enjoy the activity. Pt left supine in bed with bed alarm on, call bell in reach, and all needs met.   Therapy Documentation Precautions:  Precautions Precautions: Fall Precaution Comments: L crani, bone flap in L abdominal wall Restrictions Weight Bearing Restrictions: No  Therapy/Group: Individual Therapy  Catalina Lunger 09/22/2021, 12:13 PM

## 2021-09-22 NOTE — Progress Notes (Addendum)
PROGRESS NOTE   Subjective/Complaints: Pt slept over night. No new problems reported  ROS: Limited due to cognitive/behavioral    Objective:   No results found. No results for input(s): WBC, HGB, HCT, PLT in the last 72 hours.  Recent Labs    09/21/21 0523 09/22/21 0908  NA 134* 135  K 3.7 4.0  CL 102 102  CO2 21* 23  GLUCOSE 95 111*  BUN 28* 19  CREATININE 0.96 1.03*  CALCIUM 9.6 9.3    Intake/Output Summary (Last 24 hours) at 09/22/2021 1000 Last data filed at 09/21/2021 1900 Gross per 24 hour  Intake 480 ml  Output --  Net 480 ml         Physical Exam: Vital Signs Blood pressure 109/80, pulse (!) 105, temperature 98.3 F (36.8 C), resp. rate 18, height 5\' 6"  (1.676 m), weight 76.7 kg, SpO2 100 %. Constitutional: No distress . Vital signs reviewed. HEENT: NCAT, EOMI, oral membranes moist Neck: supple Cardiovascular: RRR without murmur. No JVD    Respiratory/Chest: CTA Bilaterally without wheezes or rales. Normal effort    GI/Abdomen: BS +, non-tender, non-distended Ext: no clubbing, cyanosis, or edema Psych: pleasantly confused Skin: Crani site CDI. Minimal swelling. Donor site clean and dry also. Few scattered ecchymoses on arms Neuro:  alert, oriented to self.  Follows commands. Still LOC  at times.   Moves all 4's fairly equally. No obvious sensory deficits.no resting tone.Improved standing balance Musculoskeletal: Full ROM, No pain with AROM or PROM in the neck, trunk, or extremities. Posture appropriate      Assessment/Plan: 1. Functional deficits which require 3+ hours per day of interdisciplinary therapy in a comprehensive inpatient rehab setting. Physiatrist is providing close team supervision and 24 hour management of active medical problems listed below. Physiatrist and rehab team continue to assess barriers to discharge/monitor patient progress toward functional and medical goals  Care  Tool:  Bathing    Body parts bathed by patient: Right arm, Left arm, Chest, Abdomen, Front perineal area, Buttocks, Right upper leg, Left upper leg, Face   Body parts bathed by helper: Left lower leg, Right lower leg     Bathing assist Assist Level: Moderate Assistance - Patient 50 - 74%     Upper Body Dressing/Undressing Upper body dressing   What is the patient wearing?: Pull over shirt    Upper body assist Assist Level: Minimal Assistance - Patient > 75%    Lower Body Dressing/Undressing Lower body dressing      What is the patient wearing?: Incontinence brief     Lower body assist Assist for lower body dressing: Maximal Assistance - Patient 25 - 49%     Toileting Toileting    Toileting assist Assist for toileting: 2 Helpers     Transfers Chair/bed transfer  Transfers assist     Chair/bed transfer assist level: 2 Helpers     Locomotion Ambulation   Ambulation assist      Assist level: Minimal Assistance - Patient > 75% Assistive device: No Device Max distance: 120   Walk 10 feet activity   Assist     Assist level: Minimal Assistance - Patient > 75% Assistive device: No  Device   Walk 50 feet activity   Assist    Assist level: Minimal Assistance - Patient > 75% Assistive device: No Device    Walk 150 feet activity   Assist    Assist level: Minimal Assistance - Patient > 75% Assistive device: No Device    Walk 10 feet on uneven surface  activity   Assist     Assist level: Minimal Assistance - Patient > 75% Assistive device: Other (comment) (R rail)   Wheelchair     Assist Is the patient using a wheelchair?: Yes Type of Wheelchair: Manual    Wheelchair assist level: Dependent - Patient 0% (pt unable to follow cues for propulsion technique)      Wheelchair 50 feet with 2 turns activity    Assist        Assist Level: Dependent - Patient 0%   Wheelchair 150 feet activity     Assist      Assist  Level: Dependent - Patient 0%   Blood pressure 109/80, pulse (!) 105, temperature 98.3 F (36.8 C), resp. rate 18, height 5\' 6"  (1.676 m), weight 76.7 kg, SpO2 100 %.  Medical Problem List and Plan: 1. 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            -patient may shower            -ELOS/Goals: 5/31, supervision to min assist goals            -Continue CIR therapies including PT, OT, and SLP  -helmet for safety  2.  Antithrombotics: -DVT/anticoagulation:  Pharmaceutical: Heparin             -antiplatelet therapy: None 3. Headaches: continue Tylenol, hydrocodone as needed  5/25 -continue topamax  25mg  qhs. Headaches better 4. Agitation: much improved, off restraints yesterday and today- maintain off for now as nurse notes presence of restraints makes her more agitated. Will call sister to see if she can spent time with her again today as patient is calmer when someone is in room with her. Pt's days/nights upside down.             -  sleep chart             -antipsychotic agents: low dose seroquelfor sleep with back up dose             -melatonin 5. Neuropsych: This patient is not capable of making decisions on her own behalf.   -ritalin has helped arousal/attention/initiationb   -still with cognitive linguistic issues 6. Skin/Wound Care: Routine skin care checks             -- dc staples today 7. Fluids/Electrolytes/Nutrition: Intake still minimal -- Dysphagia 3 diet, thin liquids--eating minimal -encourage PO - BUN/Cr -potassium stable   -NG out   -megace for appetite--intake better   -push fluids   -5/25 BUN improved to 19 today 10: Seizure activity on admission provoked by [facility]:  --continue Keppra --continue Nimotop completed 11: s/p craniectomy: plan cranioplasty in 6-8 weeks     12. Leukocytosis:  -resolved 13. Insomnia: increased trazodone to 75mg  HS prn.    LOS: 10 days A FACE TO FACE EVALUATION WAS PERFORMED  11-21-1970 09/22/2021, 10:00 AM

## 2021-09-22 NOTE — Plan of Care (Signed)
  Problem: Safety: ?Goal: Non-violent Restraint(s) ?Outcome: Progressing ?  ?Problem: Consults ?Goal: RH BRAIN INJURY PATIENT EDUCATION ?Description: Description: See Patient Education module for eduction specifics ?Outcome: Progressing ?Goal: Skin Care Protocol Initiated - if Braden Score 18 or less ?Description: If consults are not indicated, leave blank or document N/A ?Outcome: Progressing ?Goal: Nutrition Consult-if indicated ?Outcome: Progressing ?  ?Problem: RH BOWEL ELIMINATION ?Goal: RH STG MANAGE BOWEL WITH ASSISTANCE ?Description: STG Manage Bowel with Min Assistance. ?Outcome: Progressing ?Goal: RH STG MANAGE BOWEL W/MEDICATION W/ASSISTANCE ?Description: STG Manage Bowel with Medication with Min Assistance. ?Outcome: Progressing ?  ?Problem: RH BLADDER ELIMINATION ?Goal: RH STG MANAGE BLADDER WITH ASSISTANCE ?Description: STG Manage Bladder With Min Assistance ?Outcome: Progressing ?Goal: RH STG MANAGE BLADDER WITH MEDICATION WITH ASSISTANCE ?Description: STG Manage Bladder With Medication With Min Assistance. ?Outcome: Progressing ?  ?Problem: RH SKIN INTEGRITY ?Goal: RH STG MAINTAIN SKIN INTEGRITY WITH ASSISTANCE ?Description: STG Maintain Skin Integrity With Min Assistance. ?Outcome: Progressing ?Goal: RH STG ABLE TO PERFORM INCISION/WOUND CARE W/ASSISTANCE ?Description: STG Able To Perform Incision/Wound Care With Min Assistance. ?Outcome: Progressing ?  ?Problem: RH SAFETY ?Goal: RH STG ADHERE TO SAFETY PRECAUTIONS W/ASSISTANCE/DEVICE ?Description: STG Adhere to Safety Precautions With Cues and Reminders. ?Outcome: Progressing ?Goal: RH STG DECREASED RISK OF FALL WITH ASSISTANCE ?Description: STG Decreased Risk of Fall With Min Assistance. ?Outcome: Progressing ?  ?Problem: RH COGNITION-NURSING ?Goal: RH STG USES MEMORY AIDS/STRATEGIES W/ASSIST TO PROBLEM SOLVE ?Description: STG Uses Memory Aids/Strategies With Min Assistance to Problem Solve. ?Outcome: Progressing ?Goal: RH STG ANTICIPATES  NEEDS/CALLS FOR ASSIST W/ASSIST/CUES ?Description: STG Anticipates Needs/Calls for Assist With Cues and Reminders. ?Outcome: Progressing ?  ?Problem: RH PAIN MANAGEMENT ?Goal: RH STG PAIN MANAGED AT OR BELOW PT'S PAIN GOAL ?Description: < 3 on a 0-10 pain scale. ?Outcome: Progressing ?  ?Problem: RH KNOWLEDGE DEFICIT BRAIN INJURY ?Goal: RH STG INCREASE KNOWLEDGE OF SELF CARE AFTER BRAIN INJURY ?Description: Patient will demonstrate knowledge of self-care management, medication/pain management, skin/wound care, dysphagia/thickened liquids management with educational materials and handouts provided by staff independently at discharge. ?Outcome: Progressing ?Goal: RH STG INCREASE KNOWLEDGE OF DYSPHAGIA/FLUID INTAKE ?Description: Patient will demonstrate knowledge of dysphagia diets and thickened liquids with educational materials, handouts, and demonstration provided by staff independently at discharge. ?Outcome: Progressing ?  ?Problem: Consults ?Goal: RH STROKE PATIENT EDUCATION ?Description: See Patient Education module for education specifics  ?Outcome: Progressing ?  ?

## 2021-09-22 NOTE — Progress Notes (Signed)
Speech Language Pathology Daily Session Notes  Patient Details  Name: Kayla Maynard MRN: [id number] Date of Birth: 03-28-1956  Today's Date: 09/22/2021  Session 1: SLP Individual Time: 0930-1030 SLP Individual Time Calculation (min): 60 min  Session 2: SLP Individual Time: 1315-1400  SLP Individual Time Calculation (min): 45 min (Make-up)  Short Term Goals: Week 2: SLP Short Term Goal 1 (Week 2): STGs=LTGs due to ELOS  Skilled Therapeutic Interventions:  Session 1: Skilled treatment session focused on cognitive-linguistic goals. Upon arrival, patient beginning to get out of bed to ambulate to the bathroom. Min verbal cues were needed for use of proper footwear to reduce fall risk as patient was wearing only regular socks. Patient initiated conversation regarding events from last night that involved mildly abstract and complex family dynamics. Patient with minimal word-finding throughout conversation and able to express her complex thoughts, wants and needs appropriately. SLP provided education regarding importance of 24 hour supervision at discharge and attempted to help patient generate a list of potential family members and friends to assist. Patient left upright in bed with alarm on and all needs within reach. Continue with current plan of care.   Session 2 (Make-Up):  Skilled treatment session focused on communication goals. Upon arrival, patient was asleep in bed but agreeable to treatment session. Patient reported a headache, nursing aware and administered medications. SLP facilitated session with an informal language task that focused on describing her home set-up as well was identifying potential safety hazards. During description task, patient with word-finding difficulties in which she utilized strategies in 90% of opportunities with extra time. Patient also generated a list of ingredients for a familiar recipe and sequences steps to task with Supervision question cues. Patient left  upright in bed with alarm on and all needs within reach. Continue with current plan of care.      Pain  Session 1: No/Denies Pain  Session 2: Pain Assessment Pain Scale: 0-10 Pain Score: 4  Pain Type: Acute pain Pain Location: Generalized Pain Descriptors / Indicators: Aching Pain Frequency: Intermittent Pain Onset: Sudden Pain Intervention(s): Medication (See eMAR) Multiple Pain Sites: No  Therapy/Group: Individual Therapy  Jennifer Payes 09/22/2021, 3:24 PM

## 2021-09-22 NOTE — NC FL2 (Signed)
[place] MEDICAID FL2 LEVEL OF CARE SCREENING TOOL     IDENTIFICATION  Patient Name: Kayla Maynard Birthdate: 1955/12/16 Sex: female Admission Date (Current Location): 09/12/2021  [facility] and [place] Number:  Producer, television/film/video and Address:  [facility], [address] [place], [place] [address]      Provider Number: [id number]  Attending Physician Name and Address:  Ranelle Oyster, MD  Relative Name and Phone Number:  Mallory Shirk (son)#[phone]    Current Level of Care: Hospital Recommended Level of Care: Skilled Nursing Facility Prior Approval Number:    Date Approved/Denied:   PASRR Number: [id number]  Discharge Plan: SNF    Current Diagnoses: Patient Active Problem List   Diagnosis Date Noted   ICH (intracerebral hemorrhage) (HCC) 09/12/2021   Delirium    Acute respiratory failure with hypoxia (HCC)    Aspiration into airway    SAH (subarachnoid hemorrhage) (HCC) 08/28/2021   Nontraumatic subarach bleed from left posterior communicating artery (HCC) 08/27/2021   Subdural hematoma, acute (HCC) 08/27/2021   On mechanically assisted ventilation (HCC) 08/27/2021   Compression of brain due to nontraumatic subarachnoid hemorrhage (HCC) 08/27/2021   Seizure (HCC) 08/27/2021    Orientation RESPIRATION BLADDER Height & Weight     Self, Time, Situation, Place  Normal Continent Weight: 169 lb 1.5 oz (76.7 kg) Height:  5\' 6"  (167.6 cm)  BEHAVIORAL SYMPTOMS/MOOD NEUROLOGICAL BOWEL NUTRITION STATUS      Continent Diet (D3 thin)  AMBULATORY STATUS COMMUNICATION OF NEEDS Skin   Supervision Verbally Normal                       Personal Care Assistance Level of Assistance  Bathing, Dressing Bathing Assistance: Independent   Dressing Assistance: Independent     Functional Limitations Info  Sight, Hearing, Speech Sight Info: Adequate Hearing Info: Adequate Speech Info: Adequate    SPECIAL CARE FACTORS FREQUENCY  PT (By  licensed PT), OT (By licensed OT), Speech therapy     PT Frequency: 5xs per week OT Frequency: 5xs per week     Speech Therapy Frequency: 5xs per week      Contractures Contractures Info: Not present    Additional Factors Info  Code Status, Allergies Code Status Info: Full Allergies Info: Morphine;     Penicillins- Hives           Current Medications (09/22/2021):  This is the current hospital active medication list Current Facility-Administered Medications  Medication Dose Route Frequency Provider Last Rate Last Admin   acetaminophen (TYLENOL) tablet 325-650 mg  325-650 mg Oral Q4H PRN 09/24/2021, PA-C   325 mg at 09/22/21 1332   alum & mag hydroxide-simeth (MAALOX/MYLANTA) 200-200-20 MG/5ML suspension 30 mL  30 mL Oral Q4H PRN 11-07-2000, PA-C       diphenhydrAMINE (BENADRYL) 12.5 MG/5ML elixir 12.5-25 mg  12.5-25 mg Oral Q6H PRN Setzer, 05-31-1984, PA-C       enoxaparin (LOVENOX) injection 40 mg  40 mg Subcutaneous Q24H Lynnell Jude, PA-C   40 mg at 09/21/21 1700   feeding supplement (ENSURE ENLIVE / ENSURE PLUS) liquid 237 mL  237 mL Oral TID BM 09/23/21, MD   237 mL at 09/22/21 1410   fiber (NUTRISOURCE FIBER) 1 packet  1 packet Per Tube BID 09/24/21, PA-C   1 packet at 09/22/21 09/24/21   Gerhardt's butt cream   Topical PRN 1660 Valetta Fuller, PA-C  guaiFENesin-dextromethorphan (ROBITUSSIN DM) 100-10 MG/5ML syrup 5-10 mL  5-10 mL Oral Q6H PRN Setzer, Lynnell Jude, PA-C       hydrALAZINE (APRESOLINE) tablet 10 mg  10 mg Oral Q6H PRN Setzer, Lynnell Jude, PA-C       HYDROcodone-acetaminophen (NORCO) 10-325 MG per tablet 1 tablet  1 tablet Oral Q4H PRN Milinda Antis, PA-C   1 tablet at 09/16/21 8416   levETIRAcetam (KEPPRA) 100 MG/ML solution 1,000 mg  1,000 mg Oral BID Ranelle Oyster, MD   1,000 mg at 09/22/21 6063   loperamide HCl (IMODIUM) 1 MG/7.5ML suspension 2 mg  2 mg Oral PRN Milinda Antis, PA-C   2 mg at 09/17/21 1542   MEDLINE mouth rinse   15 mL Mouth Rinse q12n4p Milinda Antis, PA-C   15 mL at 09/22/21 1148   megestrol (MEGACE) 400 MG/10ML suspension 400 mg  400 mg Per Tube Daily Ranelle Oyster, MD   400 mg at 09/22/21 0160   melatonin tablet 10 mg  10 mg Oral QHS Milinda Antis, PA-C   10 mg at 09/21/21 2000   methocarbamol (ROBAXIN) tablet 500 mg  500 mg Oral Q6H PRN Milinda Antis, PA-C   500 mg at 09/16/21 2148   methylphenidate (RITALIN) tablet 5 mg  5 mg Oral BID WC Ranelle Oyster, MD   5 mg at 09/22/21 1200   multivitamin with minerals tablet 1 tablet  1 tablet Oral Daily Ranelle Oyster, MD   1 tablet at 09/22/21 1093   nicotine (NICODERM CQ - dosed in mg/24 hr) patch 7 mg  7 mg Transdermal Daily Milinda Antis, PA-C   7 mg at 09/22/21 0920   prochlorperazine (COMPAZINE) tablet 5-10 mg  5-10 mg Oral Q6H PRN Milinda Antis, PA-C   10 mg at 09/18/21 1533   Or   prochlorperazine (COMPAZINE) injection 5-10 mg  5-10 mg Intramuscular Q6H PRN Milinda Antis, PA-C       Or   prochlorperazine (COMPAZINE) suppository 12.5 mg  12.5 mg Rectal Q6H PRN Milinda Antis, PA-C       QUEtiapine (SEROQUEL) tablet 25 mg  25 mg Oral QHS PRN Ranelle Oyster, MD   25 mg at 09/16/21 2305   QUEtiapine (SEROQUEL) tablet 25 mg  25 mg Oral QHS Ranelle Oyster, MD   25 mg at 09/21/21 2000   senna-docusate (Senokot-S) tablet 1 tablet  1 tablet Oral QHS PRN Milinda Antis, PA-C       sodium phosphate (FLEET) 7-19 GM/118ML enema 1 enema  1 enema Rectal Once PRN Setzer, Lynnell Jude, PA-C       sorbitol 70 % solution 30 mL  30 mL Oral Daily PRN Milinda Antis, PA-C       topiramate (TOPAMAX) tablet 25 mg  25 mg Oral QHS Faith Rogue T, MD   25 mg at 09/21/21 2000   traZODone (DESYREL) tablet 75 mg  75 mg Oral QHS PRN Horton Chin, MD   75 mg at 09/21/21 2000     Discharge Medications: Please see discharge summary for a list of discharge medications.  Relevant Imaging Results:  Relevant Lab  Results:   Additional Information AT#[id number]  Gretchen Short, LCSW

## 2021-09-22 NOTE — Progress Notes (Signed)
Physical Therapy Session Note  Patient Details  Name: Kayla Maynard MRN: [id number] Date of Birth: 05-04-1955  Today's Date: 09/22/2021 PT Individual Time: 1416-1530 PT Individual Time Calculation (min): 74 min   Short Term Goals: Week 2:  PT Short Term Goal 1 (Week 2): STG=LTG due to ELOS.  Skilled Therapeutic Interventions/Progress Updates:     Patient in bed upon PT arrival. Patient alert and agreeable to PT session. Patient denied pain during session. Patient with helmet donned when OOB and throughout session.   Focused session on community integration and stimulation tolerance while performing functional mobility and cognitive tasks. Patient performed all mobility with supervision, ambulated >1000 feet x2 during activity. Instructed patient to locate the Gift shop from the 4W nurses station using the directional signs in the halls. Required max cues for problem solving using the maps each time and min cues for path finding. Patient required x2 cues to recall the location she was looking for and total A to recall the floor that her room is on, despite reviewing this before leaving the unit.   Patient ambulated around the [facility] in a moderately-maximally stimulated environment without signs of over stimulation or significant selective attention. Discussed shopping habits, patient reports she does not shop unless she has to, as she grows most of her food or gets it from the farmers market. Provided seated rest break in the [facility] and worked on recall and naming of fruits and vegetables she grows, what time of year they are in season, and what she makes with them, with min cues for recall and mod cues for naming, patient self-selected to use the strategy of describing the item she was looking for.   Upon return to the unit, the patient appropriately asked the nurse secretary about soup that was left for her and "gifted" it to this PT and her SLP. Encouraged patient to eat the soup as her  options she will eat from the hospital are limited.   Patient appropriate throughout activities, perseverated on challenges with visual deficits with reading throughout, and reports feeling light headed after standing in the [facility]. Took orthostatic vitals back in the room: Orthostatic Vitals: Sitting: BP 113/72, HR 88 Standing: BP 90/70, HR 101 Standing x3 min: BP 99/71, HR 108 Educated patient on orthostatic hypotension, increased fall risk, and encourage fluid intake, patient stated understanding, will need reinforcement. LPN made aware.   Patient in bed due to increased fatigue and mild headache at end of session with breaks locked, bed alarm set, Telesitter in place, and all needs within reach.   Therapy Documentation Precautions:  Precautions Precautions: Fall Precaution Comments: L crani, bone flap in L abdominal wall Restrictions Weight Bearing Restrictions: No   Therapy/Group: Individual Therapy  Cherri Yera L Marcin Holte PT, DPT  09/22/2021, 3:46 PM

## 2021-09-23 LAB — BASIC METABOLIC PANEL
Anion gap: 7 (ref 5–15)
BUN: 13 mg/dL (ref 8–23)
CO2: 21 mmol/L — ABNORMAL LOW (ref 22–32)
Calcium: 9.1 mg/dL (ref 8.9–10.3)
Chloride: 103 mmol/L (ref 98–111)
Creatinine, Ser: 0.9 mg/dL (ref 0.44–1.00)
GFR, Estimated: >60 mL/min (ref >60)
Glucose, Bld: 96 mg/dL (ref 70–99)
Potassium: 3.8 mmol/L (ref 3.5–5.1)
Sodium: 131 mmol/L — ABNORMAL LOW (ref 135–145)

## 2021-09-23 MED ORDER — LEVETIRACETAM 500 MG PO TABS
500.0000 mg | ORAL_TABLET | Freq: Two times a day (BID) | ORAL | Status: DC
  Administered 2021-09-23 – 2021-09-26 (×6): 500 mg via ORAL
  Filled 2021-09-23 (×6): qty 1

## 2021-09-23 NOTE — Progress Notes (Signed)
Occupational Therapy Session Note  Patient Details  Name: Kayla Maynard MRN: [id number] Date of Birth: [birth year]  Today's Date: 09/23/2021 OT Individual Time: 1045-1200 OT Individual Time Calculation (min): 75 min    Short Term Goals: Week 2:  OT Short Term Goal 1 (Week 2): STG = LTG d/t ELOS  Skilled Therapeutic Interventions/Progress Updates: Patient seen for IADL training and home safety education. Patient agreeable to OT intervention and motivated to return home as independent as possible. Patient report no pain or headache, but is bothered by blurred vision. Patient ambulated to home apartment setting to work on performing and problem solving IADL's. Patient able to carry basket for simulated laundry task. No difficulties with balance. Able to list steps and location of laundry tasks as performed at home. Patient states the washer and dryer are off the kitchen and she would be able to sit to fold clothes as she takes them out of the dryer. Kitchen tasks- patient reports having made changes recently to simplify IADL's and any items still on lower shelves she can have her son move to a more accessible location. Patient did not have any difficulty retrieving items in shelves of pantry following therapist instruction. Patient aware she will need help with some other IADL's and outdoor chores to be safe and reduce risk of falls. Continue with attention and visual scanning task standing to play game of checkers. Patient able to follow directions for the game and took time to make thoughtful moves. Ambulated back to her room without cues from therapist for path finding.     Therapy Documentation Precautions:  Precautions Precautions: Fall Precaution Comments: L crani, bone flap in L abdominal wall Restrictions Weight Bearing Restrictions: No     Therapy/Group: Individual Therapy  Warnell Forester 09/23/2021, 12:08 PM

## 2021-09-23 NOTE — Progress Notes (Signed)
Speech Language Pathology Daily Session Note  Patient Details  Name: Kayla Maynard MRN: [id number] Date of Birth: 04-02-1956  Today's Date: 09/23/2021 SLP Individual Time: 1300-1345 SLP Individual Time Calculation (min): 45 min  Short Term Goals: Week 2: SLP Short Term Goal 1 (Week 2): STGs=LTGs due to ELOS  Skilled Therapeutic Interventions: Pt seen for skilled ST with focus on cognitive and swallowing goals, pt in bed and agreeable to therapeutic tasks. Nursing present with meds and states pt has been eating outside food that is regular and texture and requesting pills whole with liquids in last few days. Pt observed tolerating whole pill with thin wash with no difficulty. Pt consuming steak, tomato and lettuce biscuit from [employer] with effective mastication and no overt s/s aspiration. Recommended upgrade to regular textures and whole pills, continue full supervision, with pt encouraged to order softer foods or request meds crushed when needed (pt reports baseline swallow difficulties that "change day to day" at home). SLP facilitating verbal fluency task by providing overall min A cues, pt with intermittent higher level word finding deficits that cause her some frustration but does not impact overall communicative message. Pt left in bed with alarm set and all needs within reach. Cont ST POC.  Pain Pain Assessment Pain Scale: 0-10 Pain Score: 0-No pain  Therapy/Group: Individual Therapy  Tacey Ruiz 09/23/2021, 1:45 PM

## 2021-09-23 NOTE — Progress Notes (Addendum)
PROGRESS NOTE   Subjective/Complaints: Pt up in chair. Seems to be in good spirits. Denies pain. Excited about going home soon. Ready for flap to be replaced!  ROS: Patient denies fever, rash, sore throat, blurred vision, dizziness, nausea, vomiting, diarrhea, cough, shortness of breath or chest pain, joint or back/neck pain, headache, or mood change.    Objective:   No results found. No results for input(s): WBC, HGB, HCT, PLT in the last 72 hours.  Recent Labs    09/22/21 0908 09/23/21 0527  NA 135 131*  K 4.0 3.8  CL 102 103  CO2 23 21*  GLUCOSE 111* 96  BUN 19 13  CREATININE 1.03* 0.90  CALCIUM 9.3 9.1    Intake/Output Summary (Last 24 hours) at 09/23/2021 1036 Last data filed at 09/23/2021 0700 Gross per 24 hour  Intake 477 ml  Output --  Net 477 ml         Physical Exam: Vital Signs Blood pressure 114/76, pulse 85, temperature 98.4 F (36.9 C), resp. rate 16, height 5\' 6"  (1.676 m), weight 76.7 kg, SpO2 98 %. Constitutional: No distress . Vital signs reviewed. HEENT: NCAT, EOMI, oral membranes moist Neck: supple Cardiovascular: RRR without murmur. No JVD    Respiratory/Chest: CTA Bilaterally without wheezes or rales. Normal effort    GI/Abdomen: BS +, non-tender, non-distended Ext: no clubbing, cyanosis, or edema Psych: pleasant and cooperative, impulsive Skin: Crani site CDI. Slightly concave now. Donor site clean and dry also. Few scattered ecchymoses on arms improving Neuro:  alert, oriented to self, hospital.  Follows commands. More contextually appropriate language. .   Moves all 4's fairly equally. No obvious sensory deficits.no resting tone.Improved standing balance Musculoskeletal: Full ROM, No pain with AROM or PROM in the neck, trunk, or extremities. Posture appropriate      Assessment/Plan: 1. Functional deficits which require 3+ hours per day of interdisciplinary therapy in a  comprehensive inpatient rehab setting. Physiatrist is providing close team supervision and 24 hour management of active medical problems listed below. Physiatrist and rehab team continue to assess barriers to discharge/monitor patient progress toward functional and medical goals  Care Tool:  Bathing    Body parts bathed by patient: Right arm, Left arm, Chest, Abdomen, Front perineal area, Buttocks, Right upper leg, Left upper leg, Face   Body parts bathed by helper: Left lower leg, Right lower leg     Bathing assist Assist Level: Moderate Assistance - Patient 50 - 74%     Upper Body Dressing/Undressing Upper body dressing   What is the patient wearing?: Pull over shirt    Upper body assist Assist Level: Minimal Assistance - Patient > 75%    Lower Body Dressing/Undressing Lower body dressing      What is the patient wearing?: Incontinence brief     Lower body assist Assist for lower body dressing: Maximal Assistance - Patient 25 - 49%     Toileting Toileting    Toileting assist Assist for toileting: 2 Helpers     Transfers Chair/bed transfer  Transfers assist     Chair/bed transfer assist level: 2 Helpers     Locomotion Ambulation   Ambulation assist  Assist level: Minimal Assistance - Patient > 75% Assistive device: No Device Max distance: 120   Walk 10 feet activity   Assist     Assist level: Minimal Assistance - Patient > 75% Assistive device: No Device   Walk 50 feet activity   Assist    Assist level: Minimal Assistance - Patient > 75% Assistive device: No Device    Walk 150 feet activity   Assist    Assist level: Minimal Assistance - Patient > 75% Assistive device: No Device    Walk 10 feet on uneven surface  activity   Assist     Assist level: Minimal Assistance - Patient > 75% Assistive device: Other (comment) (R rail)   Wheelchair     Assist Is the patient using a wheelchair?: Yes Type of Wheelchair:  Manual    Wheelchair assist level: Dependent - Patient 0% (pt unable to follow cues for propulsion technique)      Wheelchair 50 feet with 2 turns activity    Assist        Assist Level: Dependent - Patient 0%   Wheelchair 150 feet activity     Assist      Assist Level: Dependent - Patient 0%   Blood pressure 114/76, pulse 85, temperature 98.4 F (36.9 C), resp. rate 16, height 5\' 6"  (1.676 m), weight 76.7 kg, SpO2 98 %.  Medical Problem List and Plan: 1. 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            -patient may shower            -ELOS/Goals: 5/31, supervision to min assist goals           -Continue CIR therapies including PT, OT, and SLP  -helmet for safety  2.  Antithrombotics: -DVT/anticoagulation:  Pharmaceutical: Heparin             -antiplatelet therapy: None 3. Headaches: continue Tylenol, hydrocodone as needed  5/26 -continue topamax  25mg  qhs. Headaches better   -still using hydrocodone once per day 4. Agitation: much improved, off restraints yesterday and today- maintain off for now as nurse notes presence of restraints makes her more agitated. Will call sister to see if she can spent time with her again today as patient is calmer when someone is in room with her. Pt's days/nights upside down.             -  sleep chart             -antipsychotic agents: low dose seroquelfor sleep with back up dose             -melatonin 5. Neuropsych: This patient is not capable of making decisions on her own behalf.   -ritalin has helped arousal/attention/initiationb   -still with cognitive linguistic issues   -continue same dosing 6. Skin/Wound Care: Routine skin care checks             -- dc staples today 7. Fluids/Electrolytes/Nutrition: Intake still minimal -- Dysphagia 3 diet, thin liquids--eating minimal -encourage PO -  BUN/Cr -potassium stable   -NG out   -megace for appetite--intake better overall   -pushing fluids   -5/26 BUN much improved to 13 10: Seizure activity on admission provoked by [facility]  --continue Keppra--decrease to 500mg  bid, change to pill form  --continue Nimotop completed 11: s/p craniectomy: plan cranioplasty in 6-8 weeks     12. Leukocytosis:  -resolved  LOS: 11 days A FACE TO FACE EVALUATION WAS PERFORMED  Meredith Staggers 09/23/2021, 10:36 AM

## 2021-09-23 NOTE — Progress Notes (Signed)
Physical Therapy Session Note  Patient Details  Name: Kayla Maynard MRN: [id number] Date of Birth: 05-23-1955  Today's Date: 09/23/2021 PT Individual Time: 1415-1530 PT Individual Time Calculation (min): 75 min   Short Term Goals: Week 2:  PT Short Term Goal 1 (Week 2): STG=LTG due to ELOS.  Skilled Therapeutic Interventions/Progress Updates: Pt presents sidelying in bed w/ covers over head, but agreeable to therapy.  Pt transfers to sitting EOB w/ supervision.  Pt transfers sit to stand w/ supervision.  Pt amb > 500' w/ supervision, w/o AD w/ directions of "walk to 3 elevator on right", pt able to correctly perform even w/ distraction of talking.  Pt amb onto elevator and pushing buttons for 1st floor and following 3 step directions for amb to [employer].  Pt amb into gift shop w/ directions to look for item and performed 2/2 w/o any further cues.  Pt amb in hallways w/ direction to count red fire alarms and performed 4/5 w/ PT distractions. Pt given room number and pt required 1 cue for completing.  Pt amb around room finding different color numbered circles ( find the red, find the purple, etc) when told height of objects and limits (brown floor) and then finding even-numbered circles 4/4.  Pt directed to look for room number and went down opposite hallway reading off room numbers and realized going wrong direction.  Pt turned and returned to room and remained sitting in recliner w/ chair alarm on and all needs in reach.  Pt correctly stated what to do when wants to return to bed.  Nurse aware of pt position.     Therapy Documentation Precautions:  Precautions Precautions: Fall Precaution Comments: L crani, bone flap in L abdominal wall Restrictions Weight Bearing Restrictions: No General:   Vital Signs: Therapy Vitals Temp: 98.3 F (36.8 C) Pulse Rate: 91 Resp: 18 BP: 105/71 Patient Position (if appropriate): Lying Oxygen Therapy SpO2: 100 % O2 Device: Room  Air Pain:0/10 Pain Assessment Pain Scale: 0-10 Pain Score: 0-No pain    Therapy/Group: Individual Therapy  Lucio Edward 09/23/2021, 3:42 PM

## 2021-09-24 NOTE — Progress Notes (Addendum)
Speech Language Pathology Daily Session Note  Patient Details  Name: Kayla Maynard MRN: [id number] Date of Birth: Sep 21, 1955  Today's Date: 09/24/2021 SLP Individual Time: 6886-4847 SLP Individual Time Calculation (min): 40 min  Short Term Goals: Week 2: SLP Short Term Goal 1 (Week 2): STGs=LTGs due to ELOS  Skilled Therapeutic Interventions: Pt seen this date for skilled ST intervention targeting dysphagia and cognitive-linguistic goals outlined in care plan. Pt received awake/alert and sitting semi-reclined in bed eating lunch and coughing. Pt's son and daughter-in-law at the bedside. Agreeable to ST intervention.   SLP facilitated today's session by providing overall Max A faded to Mod A to complete semi-complex alternating attention task (auditory) and intermittent Mod to Max A to complete therapeutic generative naming task; ~80% accuracy without cues; improved to 90% accuracy with Mod to Max A naming hierarchy cues. Mod A for adherence to aspiration precautions. Pt with intermittent throat clearing with regular textures and thin liquids when upright in bed, though vocal quality remained dry and clear throughout. Cough x 1 when talking and eating in tandem. Re-educated pt and pt's son re: importance of limiting distractions, to include conversation, when eating and sitting upright for all meals (preferably OOB), and selecting softer textures as needed. Less throat clearing appreciated when pt was sitting EOB vs upright in in bed. Continue to recommend full supervision with meals. If s/sx concerning for aspiration persist, may need to downgrade to Dysphagia 3 textures. Per chart review, VSS.  Pt left in bed with all safety measures activated. Call bell reviewed and within reach, and all immediate needs met. Will plan to continue per current ST POC next session.  Pain Pt denies pain.  Therapy/Group: Individual Therapy  Kayla Maynard 09/24/2021, 3:22 PM

## 2021-09-24 NOTE — Progress Notes (Signed)
PROGRESS NOTE   Subjective/Complaints:  Patient seen in bed.  Craniotomy and donor site CDI.  Excited about going home soon and ready for flap to be replaced.  ROS: Patient denies fever, rash, sore throat, blurred vision, dizziness, nausea, vomiting, diarrhea, cough, shortness of breath or chest pain, joint or back/neck pain, headache, or mood change.    Objective:   No results found. No results for input(s): WBC, HGB, HCT, PLT in the last 72 hours.  Recent Labs    09/22/21 0908 09/23/21 0527  NA 135 131*  K 4.0 3.8  CL 102 103  CO2 23 21*  GLUCOSE 111* 96  BUN 19 13  CREATININE 1.03* 0.90  CALCIUM 9.3 9.1    Intake/Output Summary (Last 24 hours) at 09/24/2021 1614 Last data filed at 09/23/2021 1739 Gross per 24 hour  Intake 120 ml  Output --  Net 120 ml         Physical Exam: Vital Signs Blood pressure 107/68, pulse 88, temperature 98.3 F (36.8 C), resp. rate 18, height 5\' 6"  (1.676 m), weight 76.7 kg, SpO2 99 %. Constitutional: No distress.  Vital signs reviewed. HEENT: NCAT, EOMI, oral membranes moist Neck: supple Cardiovascular: RRR without murmur. No JVD present.  Respiratory/Chest: CTA Bilaterally without wheezes or rales. Effort is normal. GI/Abdomen: BS +, non-tender and non-distended Ext: no clubbing, cyanosis, or edema Psych: pleasant and cooperative, impulsive Skin: Crani site CDI. Slightly concave now. Donor site clean and dry also. Few scattered ecchymoses on arms improving Neuro:  alert, oriented to self, hospital.  Follows commands. More contextually appropriate language. Able to move all 4 extremities.  No resting tone present.  Standing balance has improved. Musculoskeletal: Full ROM without pain or limitation of neck, trunk, or extremities.  Her posture is appropriate.  Assessment/Plan: 1. Functional deficits which require 3+ hours per day of interdisciplinary therapy in a comprehensive  inpatient rehab setting. Physiatrist is providing close team supervision and 24 hour management of active medical problems listed below. Physiatrist and rehab team continue to assess barriers to discharge/monitor patient progress toward functional and medical goals  Care Tool:  Bathing    Body parts bathed by patient: Right arm, Left arm, Chest, Abdomen, Front perineal area, Buttocks, Right upper leg, Left upper leg, Face   Body parts bathed by helper: Left lower leg, Right lower leg     Bathing assist Assist Level: Moderate Assistance - Patient 50 - 74%     Upper Body Dressing/Undressing Upper body dressing   What is the patient wearing?: Pull over shirt    Upper body assist Assist Level: Minimal Assistance - Patient > 75%    Lower Body Dressing/Undressing Lower body dressing      What is the patient wearing?: Incontinence brief     Lower body assist Assist for lower body dressing: Maximal Assistance - Patient 25 - 49%     Toileting Toileting    Toileting assist Assist for toileting: 2 Helpers     Transfers Chair/bed transfer  Transfers assist     Chair/bed transfer assist level: 2 Helpers     Locomotion Ambulation   Ambulation assist      Assist level:  Supervision/Verbal cueing Assistive device: No Device Max distance: >500   Walk 10 feet activity   Assist     Assist level: Supervision/Verbal cueing Assistive device: No Device   Walk 50 feet activity   Assist    Assist level: Supervision/Verbal cueing Assistive device: No Device    Walk 150 feet activity   Assist    Assist level: Supervision/Verbal cueing Assistive device: No Device    Walk 10 feet on uneven surface  activity   Assist     Assist level: Minimal Assistance - Patient > 75% Assistive device: Other (comment) (R rail)   Wheelchair     Assist Is the patient using a wheelchair?: Yes Type of Wheelchair: Manual    Wheelchair assist level: Dependent -  Patient 0% (pt unable to follow cues for propulsion technique)      Wheelchair 50 feet with 2 turns activity    Assist        Assist Level: Dependent - Patient 0%   Wheelchair 150 feet activity     Assist      Assist Level: Dependent - Patient 0%   Blood pressure 107/68, pulse 88, temperature 98.3 F (36.8 C), resp. rate 18, height 5\' 6"  (1.676 m), weight 76.7 kg, SpO2 99 %.  Medical Problem List and Plan: 1. 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            -patient may shower            -ELOS/Goals: 5/31, supervision to min assist goals           -Continue CIR therapies including PT, OT, and SLP  -helmet for safety  5/27 continue craniotomy precautions 2.  Antithrombotics: -DVT/anticoagulation:  Pharmaceutical: Heparin             -antiplatelet therapy: None 3. Headaches: continue Tylenol, hydrocodone as needed  5/26 -continue topamax  25mg  qhs. Headaches better   -still using hydrocodone once per day             5/27 No issues with pain or headaches today. 4. Agitation: much improved, off restraints yesterday and today- maintain off for now as nurse notes presence of restraints makes her more agitated. Will call sister to see if she can spent time with her again today as patient is calmer when someone is in room with her. Pt's days/nights upside down.             -  sleep chart             -antipsychotic agents: low dose seroquelfor sleep with back up dose             -melatonin 5. Neuropsych: This patient is not capable of making decisions on her own behalf.   -ritalin has helped arousal/attention/initiationb   -still with cognitive linguistic issues   -continue same dosing 5/27 Ritalin is working well, continue use. 6. Skin/Wound Care: Routine skin care checks             -- dc staples today 7. Fluids/Electrolytes/Nutrition: Intake still  minimal -- Dysphagia 3 diet, thin liquids--eating minimal -encourage PO - BUN/Cr -potassium stable   -NG out   -megace for appetite--intake better overall   -pushing fluids   -5/26 BUN much improved to 13                         5/27 Continue  to trend with qMonday labs    Latest Ref Rng & Units 09/23/2021    5:27 AM 09/22/2021    9:08 AM 09/21/2021    5:23 AM  BMP  Glucose 70 - 99 mg/dL 96   324111   95    BUN 8 - 23 mg/dL 13   19   28     Creatinine 0.44 - 1.00 mg/dL 4.010.90   0.271.03   2.530.96    Sodium 135 - 145 mmol/L 131   135   134    Potassium 3.5 - 5.1 mmol/L 3.8   4.0   3.7    Chloride 98 - 111 mmol/L 103   102   102    CO2 22 - 32 mmol/L 21   23   21     Calcium 8.9 - 10.3 mg/dL 9.1   9.3   9.6      10: [facility]  --continue Keppra--decrease to 500mg  bid, change to pill form --continue Nimotop completed -5/27 Continue Keppra 500 mg BID 11: s/p craniectomy: plan cranioplasty in 6-8 weeks     12. Leukocytosis:  -resolved   LOS: 12 days A FACE TO FACE EVALUATION WAS PERFORMED  Tressia MinersLisa Rakeya Glab 09/24/2021, 4:14 PM

## 2021-09-25 NOTE — Progress Notes (Signed)
PROGRESS NOTE   Subjective/Complaints:  Patient seen in bed, was sleeping, and tired from last night, as noise in hall kept her awake. Craniotomy and donor site CDI.    ROS: Patient denies fever, rash, sore throat, blurred vision, dizziness, nausea, vomiting, diarrhea, cough, shortness of breath or chest pain, joint or back/neck pain, headache, or mood change.    Objective:   No results found. No results for input(s): WBC, HGB, HCT, PLT in the last 72 hours.  Recent Labs    09/23/21 0527  NA 131*  K 3.8  CL 103  CO2 21*  GLUCOSE 96  BUN 13  CREATININE 0.90  CALCIUM 9.1    Intake/Output Summary (Last 24 hours) at 09/25/2021 1424 Last data filed at 09/25/2021 0505 Gross per 24 hour  Intake 1 ml  Output --  Net 1 ml         Physical Exam: Vital Signs Blood pressure 119/71, pulse 83, temperature 98 F (36.7 C), temperature source Oral, resp. rate 18, height 5\' 6"  (1.676 m), weight 76.7 kg, SpO2 98 %. Constitutional: No distress.  Vital signs reviewed. HEENT: NCAT, EOMI, oral membranes moist Neck: supple Cardiovascular: RRR without murmur. No JVD present.  Respiratory/Chest: CTA Bilaterally without wheezes or rales. Effort is normal. GI/Abdomen: BS +, non-tender and non-distended Ext: no clubbing, cyanosis, or edema Psych: pleasant and cooperative, impulsive Skin: Crani site CDI. Slightly concave now. Donor site clean and dry also. Few scattered ecchymoses on arms improving Neuro:  alert, oriented to self, hospital.  Follows commands. More contextually appropriate language. Able to move all 4 extremities.  No resting tone present.  Standing balance has improved. Musculoskeletal: Full ROM without pain or limitation of neck, trunk, or extremities.  Her posture is appropriate.  PE unchanged from 09/24/21  Assessment/Plan: 1. Functional deficits which require 3+ hours per day of interdisciplinary therapy in a  comprehensive inpatient rehab setting. Physiatrist is providing close team supervision and 24 hour management of active medical problems listed below. Physiatrist and rehab team continue to assess barriers to discharge/monitor patient progress toward functional and medical goals  Care Tool:  Bathing    Body parts bathed by patient: Right arm, Left arm, Chest, Abdomen, Front perineal area, Buttocks, Right upper leg, Left upper leg, Face   Body parts bathed by helper: Left lower leg, Right lower leg     Bathing assist Assist Level: Moderate Assistance - Patient 50 - 74%     Upper Body Dressing/Undressing Upper body dressing   What is the patient wearing?: Pull over shirt    Upper body assist Assist Level: Minimal Assistance - Patient > 75%    Lower Body Dressing/Undressing Lower body dressing      What is the patient wearing?: Incontinence brief     Lower body assist Assist for lower body dressing: Maximal Assistance - Patient 25 - 49%     Toileting Toileting    Toileting assist Assist for toileting: 2 Helpers     Transfers Chair/bed transfer  Transfers assist     Chair/bed transfer assist level: 2 Helpers     Locomotion Ambulation   Ambulation assist      Assist level:  Supervision/Verbal cueing Assistive device: No Device Max distance: >500   Walk 10 feet activity   Assist     Assist level: Supervision/Verbal cueing Assistive device: No Device   Walk 50 feet activity   Assist    Assist level: Supervision/Verbal cueing Assistive device: No Device    Walk 150 feet activity   Assist    Assist level: Supervision/Verbal cueing Assistive device: No Device    Walk 10 feet on uneven surface  activity   Assist     Assist level: Minimal Assistance - Patient > 75% Assistive device: Other (comment) (R rail)   Wheelchair     Assist Is the patient using a wheelchair?: Yes Type of Wheelchair: Manual    Wheelchair assist level:  Dependent - Patient 0% (pt unable to follow cues for propulsion technique)      Wheelchair 50 feet with 2 turns activity    Assist        Assist Level: Dependent - Patient 0%   Wheelchair 150 feet activity     Assist      Assist Level: Dependent - Patient 0%   Blood pressure 119/71, pulse 83, temperature 98 F (36.7 C), temperature source Oral, resp. rate 18, height 5\' 6"  (1.676 m), weight 76.7 kg, SpO2 98 %.  Medical Problem List and Plan: 1. 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            -patient may shower            -ELOS/Goals: 5/31, supervision to min assist goals           -Continue CIR therapies including PT, OT, and SLP  -helmet for safety  5/27 continue craniotomy precautions 5/28 OT session today. 2.  Antithrombotics: -DVT/anticoagulation:  Pharmaceutical: Heparin             -antiplatelet therapy: None 3. Headaches: continue Tylenol, hydrocodone as needed  5/26 -continue topamax  25mg  qhs. Headaches better   -still using hydrocodone once per day             5/28 No issues with pain or headaches today. 4. Agitation: much improved, off restraints yesterday and today- maintain off for now as nurse notes presence of restraints makes her more agitated. Will call sister to see if she can spent time with her again today as patient is calmer when someone is in room with her. Pt's days/nights upside down.             -  sleep chart             -antipsychotic agents: low dose seroquelfor sleep with back up dose             -melatonin 5. Neuropsych: This patient is not capable of making decisions on her own behalf.   -ritalin has helped arousal/attention/initiationb   -still with cognitive linguistic issues   -continue same dosing 5/28 Ritalin is working well, continue use. 6. Skin/Wound Care: Routine skin care checks             -- dc  staples today 7. Fluids/Electrolytes/Nutrition: Intake still minimal -- Dysphagia 3 diet, thin liquids--eating minimal -encourage PO - BUN/Cr -potassium stable   -NG out   -megace for appetite--intake better overall   -pushing fluids   -5/26 BUN much improved to 13  5/28 Continue to trend with qMonday labs    Latest Ref Rng & Units 09/23/2021    5:27 AM 09/22/2021    9:08 AM 09/21/2021    5:23 AM  BMP  Glucose 70 - 99 mg/dL 96   638   95    BUN 8 - 23 mg/dL 13   19   28     Creatinine 0.44 - 1.00 mg/dL   4.66   5.99    Sodium 135 - 145 mmol/L 131   135   134    Potassium 3.5 - 5.1 mmol/L 3.8   4.0   3.7    Chloride 98 - 111 mmol/L 103   102   102    CO2 22 - 32 mmol/L 21   23   21     Calcium 8.9 - 10.3 mg/dL 9.1   9.3   9.6      10: Seizure activity on admission provoked by [facility]:  --continue Keppra--decrease to 500mg  bid, change to pill form --continue Nimotop completed -5/28 Continue Keppra 500 mg BID 11: s/p craniectomy: plan cranioplasty in 6-8 weeks  12. Leukocytosis:  -resolved   LOS: 13 days A FACE TO FACE EVALUATION WAS PERFORMED  [facility] 09/25/2021, 2:24 PM

## 2021-09-25 NOTE — Progress Notes (Signed)
Physical Therapy Session Note  Patient Details  Name: Kayla Maynard MRN: [id number] Date of Birth: 1955-10-06  Today's Date: 09/25/2021 PT Individual Time: 1300-1350 PT Individual Time Calculation (min): 50 min   Short Term Goals:  Week 2:  PT Short Term Goal 1 (Week 2): STG=LTG due to ELOS.  Skilled Therapeutic Interventions/Progress Updates:  Pt resting in bed.  She denied pain.  Supine> sit independently.  Sit> stand with supervision.  Pt donned house coat and helmet in standing with close supervision.  Gait training throughout unit, no AD, close supervision, x 150' x 2.  Advanced gait training, carrying a bin, retrieving 8 small cones from floor by squatting, close supervision.  Advanced gait training, transporting a wooden slide board with 2 jars of cleaning wipes on it, about 2-3 lb, x 100' x 2, during dual task of route- finding a room on MW unit, without spillage or LOB.  Pt needed mod cues for route finding; her vision impacts her ability to read signs.   Sustained stretch bil heel cords and balance challenge via standing iwht forefeet on blue wedge, with dynamic UE movements x 2 minutes without LOB.  Given moderate external perturbations at hips  from all directions, pt demonstrated ankle, hip, and stepping strategies.     At end of session, pt resting in bed with needs at hand and bed alarm set.     Therapy Documentation Precautions:  Precautions Precautions: Fall Precaution Comments: L crani, bone flap in L abdominal wall Restrictions Weight Bearing Restrictions: No      Therapy/Group: Individual Therapy  Charlies Rayburn 09/25/2021, 4:20 PM

## 2021-09-26 LAB — CBC
HCT: 34.3 % — ABNORMAL LOW (ref 36.0–46.0)
Hemoglobin: 11.8 g/dL — ABNORMAL LOW (ref 12.0–15.0)
MCH: 32.2 pg (ref 26.0–34.0)
MCHC: 34.4 g/dL (ref 30.0–36.0)
MCV: 93.7 fL (ref 80.0–100.0)
Platelets: 237 10*3/uL (ref 150–400)
RBC: 3.66 MIL/uL — ABNORMAL LOW (ref 3.87–5.11)
RDW: 11.9 % (ref 11.5–15.5)
WBC: 8.8 10*3/uL (ref 4.0–10.5)
nRBC: 0 % (ref 0.0–0.2)

## 2021-09-26 LAB — BASIC METABOLIC PANEL
Anion gap: 8 (ref 5–15)
BUN: 13 mg/dL (ref 8–23)
CO2: 23 mmol/L (ref 22–32)
Calcium: 9.1 mg/dL (ref 8.9–10.3)
Chloride: 105 mmol/L (ref 98–111)
Creatinine, Ser: 1.02 mg/dL — ABNORMAL HIGH (ref 0.44–1.00)
GFR, Estimated: >60 mL/min (ref >60)
Glucose, Bld: 92 mg/dL (ref 70–99)
Potassium: 3.9 mmol/L (ref 3.5–5.1)
Sodium: 136 mmol/L (ref 135–145)

## 2021-09-26 MED ORDER — HYDROCORTISONE 1 % EX CREA: TOPICAL_CREAM | Freq: Four times a day (QID) | CUTANEOUS | Status: DC | PRN

## 2021-09-26 MED ORDER — DIVALPROEX SODIUM 250 MG PO DR TAB
250.0000 mg | DELAYED_RELEASE_TABLET | Freq: Two times a day (BID) | ORAL | Status: DC
  Administered 2021-09-26 – 2021-09-28 (×5): 250 mg via ORAL
  Filled 2021-09-26 (×5): qty 1

## 2021-09-26 MED ORDER — DIPHENHYDRAMINE HCL 25 MG PO CAPS
25.0000 mg | ORAL_CAPSULE | Freq: Four times a day (QID) | ORAL | Status: DC | PRN
  Filled 2021-09-26: qty 1

## 2021-09-26 NOTE — Progress Notes (Signed)
PROGRESS NOTE   Subjective/Complaints:    Pt up in bed. C/o diffuse rash, quite irritating. Otherwise doing well  ROS: Limited due to cognitive/behavioral   Objective:   No results found. Recent Labs    09/26/21 0602  WBC 8.8  HGB 11.8*  HCT 34.3*  PLT 237    Recent Labs    09/26/21 0602  NA 136  K 3.9  CL 105  CO2 23  GLUCOSE 92  BUN 13  CREATININE 1.02*  CALCIUM 9.1    Intake/Output Summary (Last 24 hours) at 09/26/2021 1044 Last data filed at 09/25/2021 1741 Gross per 24 hour  Intake 472 ml  Output --  Net 472 ml         Physical Exam: Vital Signs Blood pressure 104/73, pulse 83, temperature 98.3 F (36.8 C), resp. rate 16, height 5\' 6"  (1.676 m), weight 76.7 kg, SpO2 99 %. Constitutional: No distress . Vital signs reviewed. HEENT: NCAT, EOMI, oral membranes moist Neck: supple Cardiovascular: RRR without murmur. No JVD    Respiratory/Chest: CTA Bilaterally without wheezes or rales. Normal effort    GI/Abdomen: BS +, non-tender, non-distended Ext: no clubbing, cyanosis, or edema Psych: pleasant and cooperative  Skin: Crani site CDI. Slightly concave now. Diffuse maculopapular rash Neuro:  alert, oriented to self, hospital.  Follows commands. More contextually appropriate language--still quite tangential.. Able to move all 4 extremities.  No resting tone present.  Standing balance has improved. Musculoskeletal: Full ROM without pain or limitation of neck, trunk, or extremities.  Her posture is appropriate.     Assessment/Plan: 1. Functional deficits which require 3+ hours per day of interdisciplinary therapy in a comprehensive inpatient rehab setting. Physiatrist is providing close team supervision and 24 hour management of active medical problems listed below. Physiatrist and rehab team continue to assess barriers to discharge/monitor patient progress toward functional and medical goals  Care  Tool:  Bathing    Body parts bathed by patient: Right arm, Left arm, Chest, Abdomen, Front perineal area, Buttocks, Right upper leg, Left upper leg, Face   Body parts bathed by helper: Left lower leg, Right lower leg     Bathing assist Assist Level: Moderate Assistance - Patient 50 - 74%     Upper Body Dressing/Undressing Upper body dressing   What is the patient wearing?: Pull over shirt    Upper body assist Assist Level: Minimal Assistance - Patient > 75%    Lower Body Dressing/Undressing Lower body dressing      What is the patient wearing?: Incontinence brief     Lower body assist Assist for lower body dressing: Maximal Assistance - Patient 25 - 49%     Toileting Toileting    Toileting assist Assist for toileting: 2 Helpers     Transfers Chair/bed transfer  Transfers assist     Chair/bed transfer assist level: Supervision/Verbal cueing     Locomotion Ambulation   Ambulation assist      Assist level: Supervision/Verbal cueing Assistive device: No Device Max distance: 150   Walk 10 feet activity   Assist     Assist level: Supervision/Verbal cueing Assistive device: No Device   Walk 50 feet activity  Assist    Assist level: Supervision/Verbal cueing Assistive device: No Device    Walk 150 feet activity   Assist    Assist level: Supervision/Verbal cueing Assistive device: No Device    Walk 10 feet on uneven surface  activity   Assist     Assist level: Minimal Assistance - Patient > 75% Assistive device: Other (comment) (R rail)   Wheelchair     Assist Is the patient using a wheelchair?: Yes Type of Wheelchair: Manual    Wheelchair assist level: Dependent - Patient 0% (pt unable to follow cues for propulsion technique)      Wheelchair 50 feet with 2 turns activity    Assist        Assist Level: Dependent - Patient 0%   Wheelchair 150 feet activity     Assist      Assist Level: Dependent -  Patient 0%   Blood pressure 104/73, pulse 83, temperature 98.3 F (36.8 C), resp. rate 16, height 5\' 6"  (1.676 m), weight 76.7 kg, SpO2 99 %.  Medical Problem List and Plan: 1. 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            -patient may shower            -ELOS/Goals: 5/31, supervision to min assist goals           -Continue CIR therapies including PT, OT, and SLP  -helmet for safety  2.  Antithrombotics: -DVT/anticoagulation:  Pharmaceutical: Heparin             -antiplatelet therapy: None 3. Headaches: continue Tylenol, hydrocodone as needed  5/29 -continue topamax  25mg  qhs. Headaches better   -still using hydrocodone once per day               4. Agitation: much improved, off restraints yesterday and today- maintain off for now as nurse notes presence of restraints makes her more agitated. Will call sister to see if she can spent time with her again today as patient is calmer when someone is in room with her. Pt's days/nights upside down.             -  sleep chart             -antipsychotic agents: low dose seroquelfor sleep with back up dose             -melatonin 5. Neuropsych: This patient is not capable of making decisions on her own behalf.   -ritalin has helped arousal/attention/initiationb   -still with cognitive linguistic issues   -continue same dosing  . 6. Skin/Wound Care: Routine skin care checks             -- dc staples today 7. Fluids/Electrolytes/Nutrition: Intake still minimal -- Dysphagia 3 diet, thin liquids--eating minimal -encourage PO - BUN/Cr -potassium stable   -NG out   -megace for appetite--intake better overall   -pushing fluids   -5/29 labs stable today    Latest Ref Rng & Units 09/26/2021    6:02 AM 09/23/2021    5:27 AM 09/22/2021    9:08 AM  BMP  Glucose 70 - 99 mg/dL 92   96   [id number]    BUN 8 - 23 mg/dL 13   13   19      Creatinine 0.44 - 1.00 mg/dL 0.961.02   0.450.90   4.091.03    Sodium 135 - 145 mmol/L 136  131   135    Potassium 3.5 - 5.1 mmol/L 3.9   3.8   4.0    Chloride 98 - 111 mmol/L 105   103   102    CO2 22 - 32 mmol/L 23   21   23     Calcium 8.9 - 10.3 mg/dL 9.1   9.1   9.3      10: Seizure activity on admission provoked by [facility]:  --continue Keppra--decrease to 500mg  bid, change to pill form --continue Nimotop completed -5/28 Continue Keppra 500 mg BID 11: s/p craniectomy: plan cranioplasty in 6-8 weeks  12. Diffuse rash: appears to be drug rash.   -the only change we made was to switch to pill form of keppra  -will dc keppra and begin trial of depakote 250mg  bid  -benadryl for rash   LOS: 14 days A FACE TO FACE EVALUATION WAS PERFORMED  09/26/2021, 10:44 AM

## 2021-09-26 NOTE — Progress Notes (Signed)
Speech Language Pathology Daily Session Note  Patient Details  Name: Kayla Maynard MRN: [id number] Date of Birth: Oct 30, 1955  Today's Date: 09/26/2021 SLP Individual Time: 1300-1330 SLP Individual Time Calculation (min): 30 min  Short Term Goals: Week 2: SLP Short Term Goal 1 (Week 2): STGs=LTGs due to ELOS  Skilled Therapeutic Interventions: Skilled treatment session focused on dysphagia and cognitive goals. Upon arrival, patient had just received her lunch tray and requested to eat it. SLP facilitated session by providing skilled observation with lunch meal of regular textures with thin liquids. Patient consumed meal without overt s/s of aspiration and was overall Mod I for use of swallowing compensatory strategies. Recommend patient continue current diet. While eating, patient able to alternate attention between functional conversation and self-feeding with Mod I with supervision level verbal cues to recall functional information. SLP also initiated the re-administration of the [place] (SLUMS). It was unable to be completed due to time constraints and will be completed during next session. Patient left upright in bed with alarm on and all needs within reach. Continue with current plan of care.       Pain No/Denies Pain   Therapy/Group: Individual Therapy  Daysi Boggan 09/26/2021, 3:05 PM

## 2021-09-26 NOTE — Progress Notes (Signed)
Physical Therapy Session Note  Patient Details  Name: Kayla Maynard MRN: [id number] Date of Birth: [birth year]  Today's Date: 09/26/2021 PT Individual Time: 1425-1530 PT Individual Time Calculation (min): 65 min   Short Term Goals: Week 2:  PT Short Term Goal 1 (Week 2): STG=LTG due to ELOS.  Skilled Therapeutic Interventions/Progress Updates:     Patient in recliner in the room upon PT arrival. Patient alert and agreeable to PT session. Patient denied pain during session.  Focused session on functional tasks during gait, >1000 ft, including navigation, pushing a w/c simulating a shopping cart, and carrying weighted objects with supervision and min cues for navigation throughout. Discussed d/c recommendations, safety concerns, and need for assist for cues for safety and recall. Patient receptive to all education. Reports she spoke to her granddaughter who says she will stay with the patient at d/c. Patient with poor incite into deficits and declined family education to be set up at this time. Educated on importance of having a trained caregiver to ensure instructions are recalled appropriately at home. Patient agreeable to therapist speaking to her son Ortencia Kick about family education. Patient unable to see the letters on her phone to dial. PT dialed and spoke to her son. He was unaware of the patient's plan to have her granddaughter stay with the patient, but reports family will be rotating to provide as much supervision as they are able. He was unaware of family education and agreed to have education scheduled day of d/c on Wednesday between 9-11am, CSW and scheduling made aware.   Patient reporting being comfortable in the bed and politely requested to rest at this time.   Patient in bed at end of session with breaks locked, bed alarm set, and all needs within reach. Patient missed 10 min of skilled PT due to fatigue, RN made aware. Will attempt to make-up missed time as able.    Therapy  Documentation Precautions:  Precautions Precautions: Fall Precaution Comments: L crani, bone flap in L abdominal wall Restrictions Weight Bearing Restrictions: No General: PT Amount of Missed Time (min): 10 Minutes PT Missed Treatment Reason: Patient fatigue    Therapy/Group: Individual Therapy  Ketra Duchesne L Lee-Anne Flicker PT, DPT  09/26/2021, 3:52 PM

## 2021-09-26 NOTE — Progress Notes (Signed)
Occupational Therapy Discharge Summary  Patient Details  Name: Kayla Maynard MRN: [id number] Date of Birth: April 02, 1956   Patient has met {NUMBERS 0-12:18577} of {NUMBERS 0-12:18577} long term goals due to improved activity tolerance, improved balance, postural control, ability to compensate for deficits, functional use of  RIGHT upper and RIGHT lower extremity, improved attention, improved awareness, and improved coordination.  Patient to discharge at overall Supervision level.  Patient's care partner is independent to provide the necessary cognitive assistance at discharge.  Kayla Maynard has made excellent progress with BADLs, functional mobility and functional cognition. Pt initially needed min-MAX A at admission but has improved to overall supervision level. Pt has cleared cognitively significantly however continues to have word finding defciits and decreased insight into implications of craniectomy and would benefit from supervision at home d/t safety concerns with access to a pool and a vehicle. Pt aware she should not be driving or swimming. Followup to address IADLs and return to leisure needed.   Reasons goals not met: n/a  Recommendation:  Patient will benefit from ongoing skilled OT services in outpatient setting to continue to advance functional skills in the area of BADL, iADL, and Reduce care partner burden.  Equipment: No equipment provided  Reasons for discharge: treatment goals met and discharge from hospital  Patient/family agrees with progress made and goals achieved: Yes  OT Discharge Precautions/Restrictions  Precautions Precautions: Fall Precaution Comments: L crani, bone flap in L abdominal wall Restrictions Weight Bearing Restrictions: No General   Vital Signs   Pain   ADL ADL Grooming: Setup Upper Body Bathing: Setup Lower Body Bathing: Setup Upper Body Dressing: Setup Lower Body Dressing: Setup Toileting: Setup Toilet Transfer: Distant supervision Toilet  Transfer Method: Magazine features editor: Close supervision Vision Baseline Vision/History: 0 No visual deficits Vision Assessment?: Yes Eye Alignment: Within Functional Limits Ocular Range of Motion: Within Functional Limits Alignment/Gaze Preference: Within Defined Limits Additional Comments: reporting acuity worse Perception  Perception: Impaired Praxis Praxis: Intact Cognition Cognition Overall Cognitive Status: Impaired/Different from baseline Arousal/Alertness: Awake/alert Orientation Level: Person;Place;Situation Person: Oriented Place: Oriented Situation: Oriented Memory: Impaired Attention: Sustained Sustained Attention: Appears intact Safety/Judgment: Impaired Sensation Sensation Light Touch: Appears Intact Hot/Cold: Appears Intact Proprioception: Appears Intact Coordination Gross Motor Movements are Fluid and Coordinated: No Fine Motor Movements are Fluid and Coordinated: No Coordination and Movement Description: decreased postural control Finger Nose Finger Test: minimally slower R Heel Shin Test: unable to follow cues or demonstration during testing Motor  Motor Motor: Abnormal postural alignment and control;Motor apraxia Mobility  Bed Mobility Rolling Right: Independent with assistive device Rolling Left: Independent with assistive device Supine to Sit: Independent with assistive device Sit to Supine: Independent with assistive device Transfers Sit to Stand: Independent with assistive device Stand to Sit: Independent with assistive device  Trunk/Postural Assessment  Cervical Assessment Cervical Assessment: Within Functional Limits Thoracic Assessment Thoracic Assessment: Within Functional Limits Lumbar Assessment Lumbar Assessment: Exceptions to [facility] Postural Control Postural Control: Deficits on evaluation  Balance Dynamic Sitting Balance Dynamic Sitting - Level of Assistance: 5: Stand by assistance Static Standing Balance Static  Standing - Level of Assistance: 5: Stand by assistance Dynamic Standing Balance Dynamic Standing - Level of Assistance: 5: Stand by assistance Extremity/Trunk Assessment RUE Assessment RUE Assessment: Within Functional Limits LUE Assessment LUE Assessment: Within Functional Limits   Tonny Branch 09/26/2021, 1:01 PM

## 2021-09-26 NOTE — Progress Notes (Signed)
Physical Therapy Session Note  Patient Details  Name: Kayla Maynard MRN: [id number] Date of Birth: 08/30/1955  Today's Date: 09/26/2021 PT Individual Time: 1015-1059 PT Individual Time Calculation (min): 44 min   Short Term Goals: Week 1:  PT Short Term Goal 1 (Week 1): Patient will tolerate >20 min of PT session in a moderately busy environment without behaviors. PT Short Term Goal 1 - Progress (Week 1): Met PT Short Term Goal 2 (Week 1): Patient will perform basic transfers with CGA consistently. PT Short Term Goal 2 - Progress (Week 1): Met PT Short Term Goal 3 (Week 1): Patient will ambulate >150 feet with CGA using LRAD. PT Short Term Goal 3 - Progress (Week 1): Met PT Short Term Goal 4 (Week 1): Patient will improve score on Berg Balance Scale by 7 points, MDC. PT Short Term Goal 4 - Progress (Week 1): Met Week 2:  PT Short Term Goal 1 (Week 2): STG=LTG due to ELOS.  Skilled Therapeutic Interventions/Progress Updates:  Pt seated EOB with NT in attendance.  Pt donned helmet with set up.  Pt denied pain but stated she was itching "all over" . Pt with erythematous rash on trunk and extremities.  MD aware.  Advanced gait training, with close supervision, carrying PT's lap top in tote bag in one hand, drink in other, x 200' to find same room as she looked for yesterday with this PT.  Pt improved in attention to task, finding room with fewer stops/starts and only min cues today.  Return to pt room from therapy room, carrying items as above, x 100', close supervision.   Dual task in standing on compliant foam, without UE support: Stroop effect cards- pt accurate 8/8 naming colors and 8/8 naming words.    neuromuscular re-education : self stretching in standing x 3 minutes, bil UE support, targeting gluteals and LB.  At end of session, pt seated EOB with alarm set and helmet still donned, waiting for next therapist.  Needs also left within reach.      Therapy/Group: Individual  Therapy  , 09/26/2021, 4:55 PM  

## 2021-09-26 NOTE — Progress Notes (Signed)
Occupational Therapy Session Note  Patient Details  Name: Kayla Maynard MRN: [id number] Date of Birth: 01-22-1956  Today's Date: 09/26/2021 OT Individual Time: 1115-1200 OT Individual Time Calculation (min): 45 min    Short Term Goals: Week 1:  OT Short Term Goal 1 (Week 1): Pt will groom at sink wiht no more than min cuing for seqencing OT Short Term Goal 1 - Progress (Week 1): Met OT Short Term Goal 2 (Week 1): Pt will complete toilet transfers with CGA consistently OT Short Term Goal 2 - Progress (Week 1): Met OT Short Term Goal 3 (Week 1): Pt will orient clothing with no more than min cuing OT Short Term Goal 3 - Progress (Week 1): Met OT Short Term Goal 4 (Week 1): Pt will thread BLE into pants to dmeo improved praxis OT Short Term Goal 4 - Progress (Week 1): Met  Skilled Therapeutic Interventions/Progress Updates:     Pt received in bed with no pain- mild headache with concentration better after task Therapeutic activity Pt completes alternating attention task (pipe tree and spelling name out one letter at a time on scrabble board) while carrying conversation with mod cuing for pipe tree activity and 1 error recalling where she was in spelling her name  BITS activity memory up to 5 without error- consistently with error at 6 words Trailmaking B 6 min 18 secs  Pt left at end of session in bed with exit alarm on, call light in reach and all needs met   Therapy Documentation Precautions:  Precautions Precautions: Fall Precaution Comments: L crani, bone flap in L abdominal wall Restrictions Weight Bearing Restrictions: No General:    Therapy/Group: Individual Therapy  Tonny Branch 09/26/2021, 6:46 AM

## 2021-09-27 MED ORDER — ENSURE ENLIVE PO LIQD
237.0000 mL | Freq: Two times a day (BID) | ORAL | Status: DC
  Administered 2021-09-27: 237 mL via ORAL

## 2021-09-27 NOTE — Progress Notes (Signed)
Speech Language Pathology Daily Session Note  Patient Details  Name: Kayla Maynard MRN: [id number] Date of Birth: [birth year]  Today's Date: 09/27/2021 SLP Individual Time: 0815-0900 SLP Individual Time Calculation (min): 45 min  Short Term Goals: Week 2: SLP Short Term Goal 1 (Week 2): STGs=LTGs due to ELOS  Skilled Therapeutic Interventions: Skilled treatment session focused on dysphagia and cognitive goals and family education with the patient and her son. Upon arrival, patient was awake while upright in the recliner while consuming her breakfast meal of regular textures with thin liquids. Patient consumed minimal amounts of the meal without overt s/s of aspiration with Mod I for use of swallowing compensatory strategies. Therefore, recommend patient continue current diet and change to needing only intermittent supervision with meals. Patient verbalized understanding and agreement. Patient participated in a functional conversation while self-feeding with overall Mod I for word-finding. Patient's son arrived halfway through session, therefore, SLP provided education regarding patient's current cognitive deficits and strategies to utilize at home to maximize attention, recall, independence, and overall safety at home. Both verbalized understanding of all information. Patient left upright in recliner with family present. Continue with current plan of care.      Pain No/Denies Pain   Therapy/Group: Individual Therapy  Carman Essick 09/27/2021, 3:29 PM

## 2021-09-27 NOTE — Progress Notes (Signed)
Nutrition Follow-up  DOCUMENTATION CODES:   Not applicable  INTERVENTION:   Continue Multivitamin w/ minerals daily Decrease Ensure Enlive to po BID, each supplement provides 350 kcal and 20 grams of protein. Continue Magic cup BID with meals, each supplement provides 290 kcal and 9 grams of protein  NUTRITION DIAGNOSIS:   Inadequate oral intake related to lethargy/confusion as evidenced by meal completion < 25%. - Progressing  GOAL:   Patient will meet greater than or equal to 90% of their needs - Progressing  MONITOR:   PO intake, Supplement acceptance, Labs, Skin  REASON FOR ASSESSMENT:   New TF    ASSESSMENT:   66 yo female admitted with subdural hematoma s/p coiling/craniectomy. PMH includes angina, smoker.  5/22 - diet advanced to Dysphagia 3 5/23 - Cortrak removed  Pt up walking around room, son at bedside.  Pt reports that she has been eating better. Lunch tray was there but she needed to walk around before she sat down to eat. Reports that she has been receiving 1 Ensure per day.  Pt excited to go home tomorrow. Discussed that she has Ensure at home and will drink them when she needs/wants them.   Per EMR, pt PO intake includes: 5/27: Dinner 25% 5/28: Breakfast 80%, Lunch 85%, Dinner 25% 5/29: Breakfast 50%, Lunch 25%, Dinner 85% 5/30: Breakfast 75%  Pt and son with no other questions or concerns at this time.   Medications reviewed and include: Megace, MVI Labs reviewed.  Diet Order:   Diet Order             Diet regular Room service appropriate? Yes; Fluid consistency: Thin  Diet effective now                   EDUCATION NEEDS:   No education needs have been identified at this time  Skin:  Skin Assessment: Skin Integrity Issues: Skin Integrity Issues:: Incisions Incisions: abdomen, head  Last BM:  5/29  Height:   Ht Readings from Last 1 Encounters:  09/12/21 5\' 6"  (1.676 m)    Weight:   Wt Readings from Last 1 Encounters:   09/12/21 76.7 kg    Ideal Body Weight:  59.1 kg  BMI:  Body mass index is 27.29 kg/m.  Estimated Nutritional Needs:  Kcal:  1900-2100 Protein:  100-115 gm Fluid:  1.9-2.1 L   09/14/21 RD, LDN Clinical Dietitian See [facility] for contact information.

## 2021-09-27 NOTE — Progress Notes (Signed)
Physical Therapy Session Note  Patient Details  Name: Kayla Maynard MRN: [id number] Date of Birth: 1956/02/25  Today's Date: 09/27/2021 PT Individual Time: 1100-1210 PT Individual Time Calculation (min): 70 min   Short Term Goals: Week 2:  PT Short Term Goal 1 (Week 2): STG=LTG due to ELOS.  Skilled Therapeutic Interventions/Progress Updates:     Patient in bed with her son at bedside upon PT arrival. Patient alert and agreeable to PT session. Patient denied pain during session, except 4/10 headache during 6MWT with mild HR elevation , 107bpm, resolved with seated rest break.   Patient's son present for family education and hands on training throughout session. Performed safe guarding with all mobility following PT cues and/or demonstration. Educated on fall risk/prevention, home modifications to prevent falls, and activation of emergency services in the event of a fall during session. Patient plans to carry her home phone in her pocket when alone to call for assistance in the event of a fall. Patient's son reports that family will be providing close to 24/7 supervision and camera's have been installed to check in on her when she is home alone.   Reviewed safety concerns with cooking (knives/stove), driving, swimming, staying outside in extreme heat, and walking outside without assistance. Educated on working towards independence with these activities with follow-up therapy and when cleared by MD, patient and her son in agreement. Educated on benefits of a daily routine, limited visitors (2-3 at a time), importance of night time sleep cycle, benefits of daily activity and exercise, and management of symptoms following BI.   Therapeutic Activity: Bed Mobility: Patient performed supine to sit with mod I. Patient donned her helmet independently prior to standing from the bed without cues.  Transfers: Patient performed sit to/from stand independently from various surfaces throughout  session.  Gait Training:  Patient ambulated throughout the unit without an AD with supervision for min cues for path finding to familiar locations.  Patient ambulated up/down a ramp, over 10 feet of mulch (unlevel surface), and up/down a curb to simulate community ambulation over unlevel surfaces with supervision without an AD. Provided cues for safety awareness x1. Patient ascended/descended 12x6" steps using L rail with supervision-mod I. Performed reciprocal gait pattern throughout.  6 Min Walk Test:  Instructed patient to ambulate as quickly and as safely as possible for 6 minutes using LRAD. Patient was allowed to take standing rest breaks without stopping the test, but if the patient required a sitting rest break the clock would be stopped and the test would be over.  Results: 1266 feet (385.9 meters, Avg speed 1.1 m/s) without an AD with supervision. Vitals at start: HR 92, SPO2 100% Vitals at end: HR 107, SPO2 100% (reported 4/10 headache, resolved <2 min in sitting) Results indicate that the patient has reduced endurance with ambulation compared to age matched norms.  Age Matched Norms: 1860-69 yo F: 538 meters MDC: 58.21 meters (190.98 feet) or 50 meters (ANPTA Core Set of Outcome Measures for Adults with Neurologic Conditions, 2018)  Neuromuscular Re-ed: Berg Balance Test Sit to Stand: Able to stand without using hands and stabilize independently Standing Unsupported: Able to stand safely 2 minutes Sitting with Back Unsupported but Feet Supported on Floor or Stool: Able to sit safely and securely 2 minutes Stand to Sit: Sits safely with minimal use of hands Transfers: Able to transfer safely, minor use of hands Standing Unsupported with Eyes Closed: Able to stand 10 seconds safely Standing Ubsupported with Feet Together: Able to place  feet together independently and stand 1 minute safely From Standing, Reach Forward with Outstretched Arm: Can reach confidently >25 cm (10") From  Standing Position, Pick up Object from Floor: Able to pick up shoe safely and easily From Standing Position, Turn to Look Behind Over each Shoulder: Looks behind from both sides and weight shifts well Turn 360 Degrees: Able to turn 360 degrees safely in 4 seconds or less Standing Unsupported, Alternately Place Feet on Step/Stool: Able to stand independently and safely and complete 8 steps in 20 seconds Standing Unsupported, One Foot in Front: Able to place foot tandem independently and hold 30 seconds Standing on One Leg: Able to lift leg independently and hold 5-10 seconds Total Score: 55/56 Patient demonstrated increased fall risk noted by score of 55/56 on the Berg Balance Scale.  <45/56 = fall risk, <42/56 = predictive of recurrent falls, <40/56 = 100% fall risk  >41 = independent, 21-40 = assistive device, 0-20 = wheelchair level  MDC 6.9 (4 pts 45-56, 5 pts 35-44, 7 pts 25-34) (ANPTA Core Set of Outcome Measures for Adults with Neurologic Conditions, 2018) Five times Sit to Stand Test (FTSS) Method: Use a straight back chair with a solid seat that is 17-18" high. Ask participant to sit on the chair with arms folded across their chest.   Instructions: "Stand up and sit down as quickly as possible 5 times, keeping your arms folded across your chest."   Measurement: Stop timing when the participant touches the chair in sitting the 5th time. TIME: 12.3 sec Cut off scores indicative of increased fall risk: >12 sec CVA, >16 sec PD, >13 sec vestibular (ANPTA Core Set of Outcome Measures for Adults with Neurologic Conditions, 2018) Functional Gait  Assessment Gait Level Surface: Walks 20 ft in less than 7 sec but greater than 5.5 sec, uses assistive device, slower speed, mild gait deviations, or deviates 6-10 in outside of the 12 in walkway width. Change in Gait Speed: Able to smoothly change walking speed without loss of balance or gait deviation. Deviate no more than 6 in outside of the 12 in  walkway width. Gait with Horizontal Head Turns: Performs head turns smoothly with no change in gait. Deviates no more than 6 in outside 12 in walkway width Gait with Vertical Head Turns: Performs head turns with no change in gait. Deviates no more than 6 in outside 12 in walkway width. Gait and Pivot Turn: Pivot turns safely within 3 sec and stops quickly with no loss of balance. Step Over Obstacle: Is able to step over 2 stacked shoe boxes taped together (9 in total height) without changing gait speed. No evidence of imbalance. Gait with Narrow Base of Support: Ambulates 7-9 steps. Gait with Eyes Closed: Walks 20 ft, uses assistive device, slower speed, mild gait deviations, deviates 6-10 in outside 12 in walkway width. Ambulates 20 ft in less than 9 sec but greater than 7 sec. Ambulating Backwards: Walks 20 ft, no assistive devices, good speed, no evidence for imbalance, normal gait Steps: Alternating feet, must use rail. Total Score: 26/56 Patient demonstrates increased fall risk as noted by score of 26/30 on  Functional Gait Assessment.   <22/30 = predictive of falls, <20/30 = fall in 6 months, <18/30 = predictive of falls in PD MCID: 5 points stroke population, 4 points geriatric population (ANPTA Core Set of Outcome Measures for Adults with Neurologic Conditions, 2018)  Reviewed results and interpretation of outcome measures  Patient in the room with her son (cleared  for in-room mobility with patient) at end of session.   Therapy Documentation Precautions:  Precautions Precautions: Fall Precaution Comments: L crani, bone flap in L abdominal wall Restrictions Weight Bearing Restrictions: No Other Position/Activity Restrictions: pt to wear helmet when OOB    Therapy/Group: Individual Therapy  Alaiya Martindelcampo L Lougenia Morrissey PT, DPT  09/27/2021, 5:04 PM

## 2021-09-27 NOTE — Progress Notes (Signed)
Inpatient Rehabilitation Discharge Medication Review by a Pharmacist  A complete drug regimen review was completed for this patient to identify any potential clinically significant medication issues.  High Risk Drug Classes Is patient taking? Indication by Medication  Antipsychotic Yes Seroquel for agitation  Anticoagulant No   Antibiotic No   Opioid No   Antiplatelet No   Hypoglycemics/insulin No   Vasoactive Medication No   Chemotherapy No   Other Yes Depakote, Topamax for seizure ppx Ritalin for attention Methocarbamol prn for spasms Trazodone prn for sleep     Type of Medication Issue Identified Description of Issue Recommendation(s)  Drug Interaction(s) (clinically significant)     Duplicate Therapy     Allergy     No Medication Administration End Date     Incorrect Dose     Additional Drug Therapy Needed     Significant med changes from prior encounter (inform family/care partners about these prior to discharge).    Other       Clinically significant medication issues were identified that warrant physician communication and completion of prescribed/recommended actions by midnight of the next day:  No    Pharmacist comments: None  Time spent performing this drug regimen review (minutes):  20 minutes   Tad Moore 09/27/2021 11:08 AM

## 2021-09-27 NOTE — Progress Notes (Signed)
Occupational Therapy Session Note  Patient Details  Name: Kayla Maynard MRN: [id number] Date of Birth: [birth year]  Today's Date: 09/27/2021 OT Individual Time: 1030-1100 OT Individual Time Calculation (min): 30 min   Today's Date: 09/27/2021 OT Individual Time: 1400-1456 OT Individual Time Calculation (min): 56 min  Short Term Goals: Week 2:  OT Short Term Goal 1 (Week 2): STG = LTG d/t ELOS  Skilled Therapeutic Interventions/Progress Updates:     Session 1: Pt received supine in bed and agreeable to OT tx with son in the room. Pt sat EOB and stood with (S). Completed functional mobility to the rehab gym (S). Pt engaged in activity to increase balance to increase independence in ADLs at home. Pt stood on blue foam mat with dycem for non-slip effect. Pt instructed to tap one foot at a time on the 3 cones surrounding the mat. Pt demonstrated slight LOB and required cues to wait between trials to regain balance. Suggested Pt find a stationary object on the floor to assist with LOB. Pt completed 2 sets of 10 toe-touches to the cone. Pt completed short obstacle course addressing awareness and ability to balance on one foot. Family education provided during session to Pts son regarding fall risk, performance of ADLs, and safety within the home. Pt left supine in bed with alarm on, call bell in reach, and all needs met. Son was present in the room.  Session 2: Pt received supine in bed with some complaints of a lingering headache that nearly brought her to tears. Pt reports that she wants to shower and change her clothes. Pt completed functional mobility in the room before showering to "wake up" more before showering. Able to shower and doff/don her clothes with (S). Pt showed good awareness of limitations by donning her helmet whenever she was walking and hesitating to bend too far forward on the toilet seat. Completed functional mobility (S) to the rehab gym to complete BITS to assess immediate memory  recall. 66-68% accuracy on 4 word sequence 2x. Cueing required for selective attention to task. Pt left supine in bed with alarm on, call bell in reach, and all needs met.   Therapy Documentation Precautions:  Precautions Precautions: Fall Precaution Comments: L crani, bone flap in L abdominal wall Restrictions Weight Bearing Restrictions: No Other Position/Activity Restrictions: pt to wear helmet when OOB  Therapy/Group: Individual Therapy  Marianita Botkin 09/27/2021, 7:06 AM

## 2021-09-27 NOTE — Progress Notes (Signed)
PROGRESS NOTE   Subjective/Complaints:    Pt in chair. Had questions about scalp wound. Son in for education.   ROS: Patient denies fever, rash, sore throat, blurred vision, dizziness, nausea, vomiting, diarrhea, cough, shortness of breath or chest pain, joint or back/neck pain, headache, or mood change.   Objective:   No results found. Recent Labs    09/26/21 0602  WBC 8.8  HGB 11.8*  HCT 34.3*  PLT 237    Recent Labs    09/26/21 0602  NA 136  K 3.9  CL 105  CO2 23  GLUCOSE 92  BUN 13  CREATININE 1.02*  CALCIUM 9.1    Intake/Output Summary (Last 24 hours) at 09/27/2021 1157 Last data filed at 09/27/2021 0841 Gross per 24 hour  Intake 592 ml  Output --  Net 592 ml         Physical Exam: Vital Signs Blood pressure 111/79, pulse (!) 102, temperature 98.3 F (36.8 C), resp. rate 18, height 5\' 6"  (1.676 m), weight 76.7 kg, SpO2 100 %. Constitutional: No distress . Vital signs reviewed. HEENT: NCAT, EOMI, oral membranes moist Neck: supple Cardiovascular: RRR without murmur. No JVD    Respiratory/Chest: CTA Bilaterally without wheezes or rales. Normal effort    GI/Abdomen: BS +, non-tender, non-distended Ext: no clubbing, cyanosis, or edema Psych: pleasant and cooperative  Skin: Crani site CDI. Slightly concave now. Diffuse maculopapular rash Neuro:  alert, oriented to self, hospital.  Follows commands. More contextually appropriate language--still quite tangential.. Able to move all 4 extremities.  No resting tone present.  Standing balance has improved. Musculoskeletal: Full ROM without pain or limitation of neck, trunk, or extremities.  Her posture is appropriate.     Assessment/Plan: 1. Functional deficits which require 3+ hours per day of interdisciplinary therapy in a comprehensive inpatient rehab setting. Physiatrist is providing close team supervision and 24 hour management of active medical  problems listed below. Physiatrist and rehab team continue to assess barriers to discharge/monitor patient progress toward functional and medical goals  Care Tool:  Bathing    Body parts bathed by patient: Right arm, Left arm, Chest, Abdomen, Front perineal area, Buttocks, Right upper leg, Left upper leg, Face, Right lower leg, Left lower leg   Body parts bathed by helper: Left lower leg, Right lower leg     Bathing assist Assist Level: Supervision/Verbal cueing     Upper Body Dressing/Undressing Upper body dressing   What is the patient wearing?: Pull over shirt    Upper body assist Assist Level: Supervision/Verbal cueing    Lower Body Dressing/Undressing Lower body dressing      What is the patient wearing?: Incontinence brief     Lower body assist Assist for lower body dressing: Supervision/Verbal cueing     Toileting Toileting    Toileting assist Assist for toileting: Set up assist     Transfers Chair/bed transfer  Transfers assist     Chair/bed transfer assist level: Supervision/Verbal cueing     Locomotion Ambulation   Ambulation assist      Assist level: Supervision/Verbal cueing Assistive device: No Device Max distance: 150   Walk 10 feet activity   Assist  Assist level: Supervision/Verbal cueing Assistive device: No Device   Walk 50 feet activity   Assist    Assist level: Supervision/Verbal cueing Assistive device: No Device    Walk 150 feet activity   Assist    Assist level: Supervision/Verbal cueing Assistive device: No Device    Walk 10 feet on uneven surface  activity   Assist     Assist level: Minimal Assistance - Patient > 75% Assistive device: Other (comment) (R rail)   Wheelchair     Assist Is the patient using a wheelchair?: Yes Type of Wheelchair: Manual    Wheelchair assist level: Dependent - Patient 0% (pt unable to follow cues for propulsion technique)      Wheelchair 50 feet with 2  turns activity    Assist        Assist Level: Dependent - Patient 0%   Wheelchair 150 feet activity     Assist      Assist Level: Dependent - Patient 0%   Blood pressure 111/79, pulse (!) 102, temperature 98.3 F (36.8 C), resp. rate 18, height 5\' 6"  (1.676 m), weight 76.7 kg, SpO2 100 %.  Medical Problem List and Plan: 1. 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            -patient may shower            -ELOS/Goals: 5/31, supervision to min assist goals           -Continue CIR therapies including PT, OT, and SLP   -helmet for safety  2.  Antithrombotics: -DVT/anticoagulation:  Pharmaceutical: Heparin             -antiplatelet therapy: None 3. Headaches: continue Tylenol, hydrocodone as needed  5/29 -continue topamax  25mg  qhs. Headaches better   -still using hydrocodone once per day early in AM               4. Agitation: resolved             - may dc sleep chart             -antipsychotic agents: low dose seroquelfor sleep with back up dose             -melatonin 5. Neuropsych: This patient is not capable of making decisions on her own behalf.   -ritalin has helped arousal/attention/initiation===continue at home   -improving cognitive linguistic issues   -continue same dosing  . 6. Skin/Wound Care: Routine skin care checks             -- dc staples today 7. Fluids/Electrolytes/Nutrition: Intake still minimal -- Dysphagia 3 diet, thin liquids--eating minimal -encourage PO - BUN/Cr -potassium stable   -NG out   -megace for appetite--intake much better   -pushing fluids   -5/29 labs stable      Latest Ref Rng & Units 09/26/2021    6:02 AM 09/23/2021    5:27 AM 09/22/2021    9:08 AM  BMP  Glucose 70 - 99 mg/dL 92   96   09/25/2021    BUN 8 - 23 mg/dL 13   13   19     Creatinine 0.44 - 1.00 mg/dL 09/24/2021   062      Sodium 135 - 145 mmol/L 136    131   135    Potassium 3.5 - 5.1 mmol/L 3.9   3.8   4.0    Chloride 98 -  111 mmol/L 105   103   102    CO2 22 - 32 mmol/L 23   21   23     Calcium 8.9 - 10.3 mg/dL 9.1   9.1   9.3      10: Seizure activity on admission provoked by [facility]:  --keppra-->depakote d/t rash 11: s/p craniectomy: plan cranioplasty in 6-8 weeks  12. Diffuse rash: appears to be drug rash. No major issues  -the only change we made was to switch to pill form of keppra  -have stopped keppra and started trial of depakote 250mg  bid  -benadryl for rash   LOS: 15 days A FACE TO FACE EVALUATION WAS PERFORMED  [facility] 09/27/2021, 11:57 AM

## 2021-09-27 NOTE — Patient Care Conference (Signed)
Inpatient RehabilitationTeam Conference and Plan of Care Update Date: 09/27/2021   Time: 10:08 AM    Patient Name: Kayla Maynard      Medical Record Number: [id number]  Date of Birth: 04-11-1956 Sex: Female         Room/Bed: 4W16C/4W16C-01 Payor Info: Payor: MEDICARE / Plan: MEDICARE PART A AND B / Product Type: *No Product type* /    Admit Date/Time:  09/12/2021  2:29 PM  Primary Diagnosis:  ICH (intracerebral hemorrhage) [facility])  Hospital Problems: Principal Problem:   ICH (intracerebral hemorrhage) [facility])    Expected Discharge Date: Expected Discharge Date: 09/28/21  Team Members Present: Physician leading conference: Dr. Faith Rogue Social Worker Present: Cecile Sheerer, LCSWA Nurse Present: Kennyth Arnold, RN PT Present: Serina Cowper, PT OT Present: Jake Shark, OT SLP Present: Feliberto Gottron, SLP PPS Coordinator present : Fae Pippin, SLP     Current Status/Progress Goal Weekly Team Focus  Bowel/Bladder   continent b/b  maintain continence  toilet as needed   Swallow/Nutrition/ Hydration   Regular textures with thin liquids, Supervision-Mod I  Supervision  Family Edu   ADL's   supervision ADLs and IADLs with decreased insight into deficits/safety implications  supervision  DC planning/family training   Mobility   supervision-mod I overall, gait >1000 ft  supervision-mod I  HEP, d/c planning, family education, d/c assessment   Communication   Supervision-Min A  Min A  Family Education   Safety/Cognition/ Behavioral Observations  Min A  Min A  Family Education   Pain   no pain reported  < 3  assess pain q 4 hr and prn   Skin   surgical incisions CDI  no breakdown  assess skin q shift and prn     Discharge Planning:  Pt lives alone and will only have intermittent support in the evening. No family to provide care during the day.   Team Discussion: Doing well. Developed a rash over the weekend. Possibly due to change in Keppra route. Continent  B/B, no reported pain. Incisions CDI. Discharging home. Family to provide as close to 24/7 supervision as possible.   Patient on target to meet rehab goals: yes, supervision goals. At goal level. Continue to work on Engineer, building services. Aphagia improved, memory still slow. Family reports the cover is still on the pool.   *See Care Plan and progress notes for long and short-term goals.   Revisions to Treatment Plan:  Finalizing discharge plans.   Teaching Needs: Family education complete. Continue to stress safety awareness.   Current Barriers to Discharge: Decreased caregiver support and Lack of/limited family support  Possible Resolutions to Barriers: Family to provide intermittent supervision      Medical Summary Current Status: improved cognitively. still with impairments in judgement and safety. new rash  Barriers to Discharge: Medical stability   Possible Resolutions to [employer] Focus: finalize medication regimen, medical plan for dc   Continued Need for Acute Rehabilitation Level of Care: The patient requires daily medical management by a physician with specialized training in physical medicine and rehabilitation for the following reasons: Direction of a multidisciplinary physical rehabilitation program to maximize functional independence : Yes Medical management of patient stability for increased activity during participation in an intensive rehabilitation regime.: Yes Analysis of laboratory values and/or radiology reports with any subsequent need for medication adjustment and/or medical intervention. : Yes   I attest that I was present, lead the team conference, and concur with the assessment and plan of the team.   Marlyne Beards,  Mekhia Brogan G 09/27/2021, 12:29 PM

## 2021-09-27 NOTE — Progress Notes (Signed)
Physical Therapy Discharge Summary  Patient Details  Name: Kayla Maynard MRN: [id number] Date of Birth: 1956-04-21   Patient has met 11 of 11 long term goals due to improved activity tolerance, improved balance, improved postural control, increased strength, decreased pain, ability to compensate for deficits, improved attention, improved awareness, and improved coordination.  Patient to discharge at an ambulatory level Supervision without an AD.   Patient's care partner is independent to provide the necessary cognitive assistance at discharge.  Reasons goals not met: Patient has met all PT goals at this time.  Recommendation:  Patient will benefit from ongoing skilled PT services in outpatient setting to continue to advance safe functional mobility, address ongoing impairments in balance, activity tolerance, community integration, dual task training, safety awareness, patient/caregiver education, and minimize fall risk.  Equipment: No equipment provided  Reasons for discharge: treatment goals met  Patient/family agrees with progress made and goals achieved: Yes  PT Discharge Precautions/Restrictions Precautions Precautions: Fall Precaution Comments: L crani, bone flap in L abdominal wall Restrictions Weight Bearing Restrictions: No Other Position/Activity Restrictions: pt to wear helmet when OOB Pain Interference Pain Interference Pain Effect on Sleep: 1. Rarely or not at all Pain Interference with Therapy Activities: 1. Rarely or not at all Pain Interference with Day-to-Day Activities: 1. Rarely or not at all Vision/Perception  Vision - History Ability to See in Adequate Light: 1 Impaired Vision - Assessment Eye Alignment: Within Functional Limits Ocular Range of Motion: Within Functional Limits Alignment/Gaze Preference: Within Defined Limits Tracking/Visual Pursuits: Able to track stimulus in all quads without difficulty Saccades: Within functional limits Convergence:  Within functional limits Perception Perception: Within Functional Limits Praxis Praxis: Intact  Cognition Overall Cognitive Status: Impaired/Different from baseline Arousal/Alertness: Awake/alert Orientation Level: Oriented X4 Attention: Sustained;Divided Sustained Attention: Appears intact Divided Attention: Impaired Memory: Impaired Memory Impairment: Storage deficit;Retrieval deficit;Decreased recall of new information Awareness: Impaired Awareness Impairment: Anticipatory impairment Problem Solving: Impaired Problem Solving Impairment: Verbal complex;Functional complex Safety/Judgment: Impaired Comments: Still with safety awareness deficits, higher level Sensation Sensation Light Touch: Appears Intact Hot/Cold: Appears Intact Proprioception: Appears Intact Coordination Gross Motor Movements are Fluid and Coordinated: No Fine Motor Movements are Fluid and Coordinated: No Coordination and Movement Description: decreased postural control Heel Shin Test: WFL bilaterally Motor  Motor Motor: Abnormal postural alignment and control  Mobility Bed Mobility Rolling Right: Independent Rolling Left: Independent Supine to Sit: Independent Sit to Supine: Independent Transfers Sit to Stand: Independent Stand to Sit: Independent Stand Pivot Transfers: Independent Transfer (Assistive device): None Locomotion  Gait Ambulation: Yes Gait Assistance: Supervision/Verbal cueing (independent household, supervision community) Social research officer, government (Feet): 1266 Feet (on 6MWT) Assistive device: None Gait Gait: Yes Gait Pattern: Impaired Gait Pattern: Step-through pattern;Decreased stride length;Decreased trunk rotation Gait velocity: 1.1 m/s avg on 6MWT Stairs / Additional Locomotion Stairs: Yes Stairs Assistance: Supervision/Verbal cueing;Independent with assistive device Stair Management Technique: One rail Left Number of Stairs: 12 Height of Stairs: 6 Ramp: Supervision/Verbal  cueing Curb: Supervision/Verbal cueing Wheelchair Mobility Wheelchair Mobility: No  Trunk/Postural Assessment  Cervical Assessment Cervical Assessment: Within Functional Limits Thoracic Assessment Thoracic Assessment: Within Functional Limits Lumbar Assessment Lumbar Assessment: Exceptions to [facility] Postural Control Postural Control: Deficits on evaluation  Balance Berg Balance Test Sit to Stand: Able to stand without using hands and stabilize independently Standing Unsupported: Able to stand safely 2 minutes Sitting with Back Unsupported but Feet Supported on Floor or Stool: Able to sit safely and securely 2 minutes Stand to Sit: Sits safely with minimal use of hands Transfers:  Able to transfer safely, minor use of hands Standing Unsupported with Eyes Closed: Able to stand 10 seconds safely Standing Ubsupported with Feet Together: Able to place feet together independently and stand 1 minute safely From Standing, Reach Forward with Outstretched Arm: Can reach confidently >25 cm (10") From Standing Position, Pick up Object from Floor: Able to pick up shoe safely and easily From Standing Position, Turn to Look Behind Over each Shoulder: Looks behind from both sides and weight shifts well Turn 360 Degrees: Able to turn 360 degrees safely in 4 seconds or less Standing Unsupported, Alternately Place Feet on Step/Stool: Able to stand independently and safely and complete 8 steps in 20 seconds Standing Unsupported, One Foot in Front: Able to place foot tandem independently and hold 30 seconds Standing on One Leg: Able to lift leg independently and hold 5-10 seconds Total Score: 55 Static Sitting Balance Static Sitting - Level of Assistance: 7: Independent Dynamic Sitting Balance Dynamic Sitting - Level of Assistance: 7: Independent Static Standing Balance Static Standing - Level of Assistance: 7: Independent Dynamic Standing Balance Dynamic Standing - Level of Assistance: 7:  Independent Functional Gait  Assessment Gait Level Surface: Walks 20 ft in less than 7 sec but greater than 5.5 sec, uses assistive device, slower speed, mild gait deviations, or deviates 6-10 in outside of the 12 in walkway width. Change in Gait Speed: Able to smoothly change walking speed without loss of balance or gait deviation. Deviate no more than 6 in outside of the 12 in walkway width. Gait with Horizontal Head Turns: Performs head turns smoothly with no change in gait. Deviates no more than 6 in outside 12 in walkway width Gait with Vertical Head Turns: Performs head turns with no change in gait. Deviates no more than 6 in outside 12 in walkway width. Gait and Pivot Turn: Pivot turns safely within 3 sec and stops quickly with no loss of balance. Step Over Obstacle: Is able to step over 2 stacked shoe boxes taped together (9 in total height) without changing gait speed. No evidence of imbalance. Gait with Narrow Base of Support: Ambulates 7-9 steps. Gait with Eyes Closed: Walks 20 ft, uses assistive device, slower speed, mild gait deviations, deviates 6-10 in outside 12 in walkway width. Ambulates 20 ft in less than 9 sec but greater than 7 sec. Ambulating Backwards: Walks 20 ft, no assistive devices, good speed, no evidence for imbalance, normal gait Steps: Alternating feet, must use rail. Total Score: 26 Extremity Assessment  RLE Assessment RLE Assessment: Within Functional Limits Active Range of Motion (AROM) Comments: Grossly WFL for all functional mobility General Strength Comments: Grossly 5/5 throughout in sitting LLE Assessment LLE Assessment: Within Functional Limits Active Range of Motion (AROM) Comments: Grossly WFL for all functional mobility General Strength Comments: Grossly 5/5 throughout in sitting    Kenyon Eichelberger L Lillah Standre PT, DPT  09/27/2021, 3:37 PM

## 2021-09-27 NOTE — Progress Notes (Signed)
Patient ID: Kayla Maynard, female   DOB: 10/29/1955, 66 y.o.   MRN: [id number]  SW met with pt and pt son Kayla Maynard in room to provide updates from team conference, and d/c date remains 5/31. Preferred outpatient location remains [facility] [place] [place]).   SW faxed referral to [place] (p:[phone]/f:[phone]). *SW spoke with [name] confirmed referral was received.  Loralee Pacas, MSW, [place] Office: [phone] Cell: [phone] Fax: [phone]

## 2021-09-28 DIAGNOSIS — L270 Generalized skin eruption due to drugs and medicaments taken internally: Secondary | ICD-10-CM

## 2021-09-28 DIAGNOSIS — G441 Vascular headache, not elsewhere classified: Secondary | ICD-10-CM

## 2021-09-28 MED ORDER — HYDROCORTISONE 1 % EX CREA: TOPICAL_CREAM | Freq: Four times a day (QID) | CUTANEOUS | 0 refills | Status: AC | PRN

## 2021-09-28 MED ORDER — ADULT MULTIVITAMIN W/MINERALS CH: 1.0000 | ORAL_TABLET | Freq: Every day | ORAL | Status: AC

## 2021-09-28 MED ORDER — MEGESTROL ACETATE 400 MG/10ML PO SUSP: 400.0000 mg | Freq: Every day | ORAL | 0 refills | Status: AC

## 2021-09-28 MED ORDER — MELATONIN 10 MG PO TABS: 10.0000 mg | ORAL_TABLET | Freq: Every day | ORAL | 0 refills | Status: AC

## 2021-09-28 MED ORDER — METHYLPHENIDATE HCL 5 MG PO TABS: 5.0000 mg | ORAL_TABLET | Freq: Two times a day (BID) | ORAL | 0 refills | Status: DC

## 2021-09-28 MED ORDER — TOPIRAMATE 25 MG PO TABS: 25.0000 mg | ORAL_TABLET | Freq: Every day | ORAL | 0 refills | Status: DC

## 2021-09-28 MED ORDER — DIVALPROEX SODIUM 250 MG PO DR TAB: 250.0000 mg | DELAYED_RELEASE_TABLET | Freq: Two times a day (BID) | ORAL | 0 refills | Status: DC

## 2021-09-28 MED ORDER — ACETAMINOPHEN 325 MG PO TABS: 325.0000 mg | ORAL_TABLET | ORAL | Status: AC | PRN

## 2021-09-28 NOTE — Progress Notes (Addendum)
PROGRESS NOTE   Subjective/Complaints:    Pt in good spirits. Excited to go home!  ROS: Patient denies fever, rash, sore throat, blurred vision, dizziness, nausea, vomiting, diarrhea, cough, shortness of breath or chest pain, joint or back/neck pain, headache, or mood change.   Objective:   No results found. Recent Labs    09/26/21 0602  WBC 8.8  HGB 11.8*  HCT 34.3*  PLT 237    Recent Labs    09/26/21 0602  NA 136  K 3.9  CL 105  CO2 23  GLUCOSE 92  BUN 13  CREATININE 1.02*  CALCIUM 9.1    Intake/Output Summary (Last 24 hours) at 09/28/2021 0841 Last data filed at 09/27/2021 1744 Gross per 24 hour  Intake 600 ml  Output --  Net 600 ml         Physical Exam: Vital Signs Blood pressure 112/73, pulse 81, temperature 98.2 F (36.8 C), temperature source Oral, resp. rate 17, height 5\' 6"  (1.676 m), weight 76.7 kg, SpO2 99 %. Constitutional: No distress . Vital signs reviewed. HEENT: NCAT, EOMI, oral membranes moist Neck: supple Cardiovascular: RRR without murmur. No JVD    Respiratory/Chest: CTA Bilaterally without wheezes or rales. Normal effort    GI/Abdomen: BS +, non-tender, non-distended Ext: no clubbing, cyanosis, or edema Psych: pleasant and cooperative  Skin: Crani site CDI. Rash seems improved Neuro:  alert, oriented to self, hospital.  Follows commands. More contextually appropriate language--still tangential.. Able to move all 4 extremities.  No resting tone present.  Standing balance has improved. Musculoskeletal: Full ROM without pain or limitation of neck, trunk, or extremities.  Her posture is appropriate.     Assessment/Plan: 1. Functional deficits which require 3+ hours per day of interdisciplinary therapy in a comprehensive inpatient rehab setting. Physiatrist is providing close team supervision and 24 hour management of active medical problems listed below. Physiatrist and rehab  team continue to assess barriers to discharge/monitor patient progress toward functional and medical goals  Care Tool:  Bathing    Body parts bathed by patient: Right arm, Left arm, Chest, Abdomen, Front perineal area, Buttocks, Right upper leg, Left upper leg, Face, Right lower leg, Left lower leg   Body parts bathed by helper: Left lower leg, Right lower leg     Bathing assist Assist Level: Supervision/Verbal cueing     Upper Body Dressing/Undressing Upper body dressing   What is the patient wearing?: Pull over shirt    Upper body assist Assist Level: Supervision/Verbal cueing    Lower Body Dressing/Undressing Lower body dressing      What is the patient wearing?: Incontinence brief, Pants     Lower body assist Assist for lower body dressing: Supervision/Verbal cueing     Toileting Toileting    Toileting assist Assist for toileting: Set up assist     Transfers Chair/bed transfer  Transfers assist     Chair/bed transfer assist level: Independent     Locomotion Ambulation   Ambulation assist      Assist level: Supervision/Verbal cueing Assistive device: No Device Max distance: 1266 ft   Walk 10 feet activity   Assist     Assist level: Independent  Assistive device: No Device   Walk 50 feet activity   Assist    Assist level: Independent Assistive device: No Device    Walk 150 feet activity   Assist    Assist level: Supervision/Verbal cueing Assistive device: No Device    Walk 10 feet on uneven surface  activity   Assist     Assist level: Supervision/Verbal cueing Assistive device: Other (comment) (R rail)   Wheelchair     Assist Is the patient using a wheelchair?: No Type of Wheelchair: Manual    Wheelchair assist level: Dependent - Patient 0% (pt unable to follow cues for propulsion technique)      Wheelchair 50 feet with 2 turns activity    Assist        Assist Level: Dependent - Patient 0%    Wheelchair 150 feet activity     Assist      Assist Level: Dependent - Patient 0%   Blood pressure 112/73, pulse 81, temperature 98.2 F (36.8 C), temperature source Oral, resp. rate 17, height 5\' 6"  (1.676 m), weight 76.7 kg, SpO2 99 %.  Medical Problem List and Plan: 1. 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            -dc home today  -f/u with NS, [facility]. Needs primary too -helmet for safety  2.  Antithrombotics: -DVT/anticoagulation:  Pharmaceutical: Heparin             -antiplatelet therapy: None 3. Headaches: continue Tylenol, hydrocodone as needed  5/31 -continue topamax  25mg  qhs. Headaches better   -no longer hydrocodone--dc 4. Agitation: resolved             - may dc sleep chart             -antipsychotic agents: can change seroquel to prn only             -melatonin 5. Neuropsych: This patient is not capable of making decisions on her own behalf.   -ritalin has helped arousal/attention/initiation    -improving cognitive linguistic issues   -continue same dosing--can wean as outpt  . 6. Skin/Wound Care: Routine skin care checks             -- dc staples today 7. Fluids/Electrolytes/Nutrition: Intake still minimal -- Dysphagia 3 diet, thin liquids--eating minimal -encourage PO - BUN/Cr -potassium stable   -NG out   -megace for appetite--intake much better   -pushing fluids   -5/29 labs stable      Latest Ref Rng & Units 09/26/2021    6:02 AM 09/23/2021    5:27 AM 09/22/2021    9:08 AM  BMP  Glucose 70 - 99 mg/dL 92   96   09/25/2021    BUN 8 - 23 mg/dL 13   13   19     Creatinine 0.44 - 1.00 mg/dL 09/24/2021   737      Sodium 135 - 145 mmol/L 136   131   135    Potassium 3.5 - 5.1 mmol/L 3.9   3.8   4.0    Chloride 98 - 111 mmol/L 105   103   102    CO2 22 - 32 mmol/L 23   21   23     Calcium 8.9 - 10.3 mg/dL 9.1   9.1   9.3      10: Seizure  activity on admission provoked by [facility]:  --keppra-->depakote d/t rash  11: s/p craniectomy: plan cranioplasty in 6-8 weeks  12. Diffuse rash: appears to be drug rash. No major issues  -the only change we made was to switch to pill form of keppra  -have stopped keppra and started trial of depakote 250mg  bid  -benadryl for rash  -5/31 rash seems somewhat improved   LOS: 16 days A FACE TO FACE EVALUATION WAS PERFORMED  6/31 09/28/2021, 8:41 AM

## 2021-09-28 NOTE — Plan of Care (Signed)
  Problem: RH Swallowing Goal: LTG Patient will consume least restrictive diet using compensatory strategies with assistance (SLP) Description: LTG:  Patient will consume least restrictive diet using compensatory strategies with assistance (SLP) Outcome: Completed/Met Goal: LTG Patient will participate in dysphagia therapy to increase swallow function with assistance (SLP) Description: LTG:  Patient will participate in dysphagia therapy to increase swallow function with assistance (SLP) Outcome: Completed/Met Goal: LTG Pt will demonstrate functional change in swallow as evidenced by bedside/clinical objective assessment (SLP) Description: LTG: Patient will demonstrate functional change in swallow as evidenced by bedside/clinical objective assessment (SLP) Outcome: Completed/Met   Problem: RH Cognition - SLP Goal: RH LTG Patient will demonstrate orientation with cues Description:  LTG:  Patient will demonstrate orientation to person/place/time/situation with cues (SLP)   Outcome: Completed/Met   Problem: RH Expression Communication Goal: LTG Patient will verbally express basic/complex needs(SLP) Description: LTG:  Patient will verbally express basic/complex needs, wants or ideas with cues  (SLP) Outcome: Completed/Met   Problem: RH Problem Solving Goal: LTG Patient will demonstrate problem solving for (SLP) Description: LTG:  Patient will demonstrate problem solving for basic/complex daily situations with cues  (SLP) Outcome: Completed/Met   Problem: RH Memory Goal: LTG Patient will use memory compensatory aids to (SLP) Description: LTG:  Patient will use memory compensatory aids to recall biographical/new, daily complex information with cues (SLP) Outcome: Completed/Met   Problem: RH Attention Goal: LTG Patient will demonstrate this level of attention during functional activites (SLP) Description: LTG:  Patient will will demonstrate this level of attention during functional activites  (SLP) Outcome: Completed/Met   Problem: RH Awareness Goal: LTG: Patient will demonstrate awareness during functional activites type of (SLP) Description: LTG: Patient will demonstrate awareness during functional activites type of (SLP) Outcome: Completed/Met

## 2021-09-28 NOTE — Progress Notes (Signed)
Speech Language Pathology Discharge Summary  Patient Details  Name: Kayla Maynard MRN: [id number] Date of Birth: February 04, 1956  Today's Date: 09/28/2021 SLP Individual Time: 0700-0730 SLP Individual Time Calculation (min): 30 min   Skilled Therapeutic Interventions:  Skilled treatment session focused on cognitive goals. SLP facilitated session by completing the [facility] Mental Status Examination (SLUMS). Patient scored  26/30 points with a score of 27 or above considered normal with deficits noted in problem solving, short-term recall and verbal fluency. Patient requested assistance with gathering her personal belongings to prepare for discharge today. Supervision-Min level verbal cues were needed for safety with tasks as patient attempting to lift a large suitcase onto her bed and for attempting to reach for things that were too far away. Patient left upright in bed with alarm on and all needs within reach.    Patient has met 9 of 9 long term goals.  Patient to discharge at overall Supervision;Min level.   Reasons goals not met: N/A   Clinical Impression/Discharge Summary: Patient has made excellent gains and has met 9 of 9 LTGs this admission. Currently, patient is consuming regular textures with thin liquids with minimal overt s/s of aspiration and is overall Mod I for use of swallowing compensatory strategies. Patient also demonstrates improved verbal expression with minimal high-level word-finding deficits noted at the conversation level. Patient currently requires supervision-Min A verbal cues to complete mildly complex tasks safely in regards to problem solving, recall, anticipatory awareness and divided attention. Patient and family education is complete and patient will discharge home with assistance from family. Patient would benefit from f/u SLP services to maximize her cognitive-linguistic and swallowing function in order to reduce caregiver burden.   Care Partner:   Caregiver Able to Provide Assistance: Yes     Recommendation:  Outpatient SLP;24 hour supervision/assistance  Rationale for SLP Follow Up: Reduce caregiver burden;Maximize cognitive function and independence;Maximize functional communication;Maximize swallowing safety   Equipment: N/A   Reasons for discharge: Discharged from hospital;Treatment goals met   Patient/Family Agrees with Progress Made and Goals Achieved: Yes    [place], [place] 09/28/2021, 6:34 AM

## 2021-09-28 NOTE — Progress Notes (Signed)
Inpatient Rehabilitation Care Coordinator Discharge Note   Patient Details  Name: Kayla Maynard MRN: [id number] Date of Birth: 09-03-1955   Discharge location: D/c to home with intermittent support  Length of Stay: 15 days  Discharge activity level: Supervision without an AD  Home/community participation: Limited  Patient response BS:WHQPRF Literacy - How often do you need to have someone help you when you read instructions, pamphlets, or other written material from your doctor or pharmacy?: Never  Patient response FM:BWGYKZ Isolation - How often do you feel lonely or isolated from those around you?: Never  Services provided included: MD, RD, OT, SLP, CM, Pharmacy, Neuropsych, SW, TR, RN, PT  Financial Services:  Financial Services Utilized: Medicare    Choices offered to/list presented to: yes  Follow-up services arranged:  Outpatient    Outpatient Servicies: [facility] Outpa[facility] PT/OT/SLP      Patient response to transportation need: Is the patient able to respond to transportation needs?: Yes In the past 12 months, has lack of transportation kept you from medical appointments or from getting medications?: No In the past 12 months, has lack of transportation kept you from meetings, work, or from getting things needed for daily living?: No    Comments (or additional information):  Patient/Family verbalized understanding of follow-up arrangements:  Yes  Individual responsible for coordination of the follow-up plan: Contact pt son Ortencia Kick  Confirmed correct DME delivered: Gretchen Short 09/28/2021    Gretchen Short

## 2021-09-28 NOTE — Progress Notes (Signed)
Patient ID: Kayla Maynard, female   DOB: [birth year], 66 y.o.   MRN: [id number]  SW received message from [facility] stating they will accept attending rehab physician to sign orders for patient.   Cecile Sheerer, MSW, LCSWA Office: [phone] Cell: [phone] Fax: [phone]

## 2021-10-04 ENCOUNTER — Telehealth: Payer: Self-pay

## 2021-10-04 NOTE — Telephone Encounter (Addendum)
SW received phone call from [place] asking for order from physician that will sign off on orders since no PCP listed.   SW faxed signed order by previous physician- Dr. Naaman Plummer.   1216pm-SW left message for Butch Penny to confirm if order received.  *SW received message indicating order received. Requesting SLP notes. SW faxed all clinicals again (d/c summaries).   Loralee Pacas, MSW, [place] Office: [phone] Cell: [phone] Fax: [phone]

## 2021-10-10 ENCOUNTER — Telehealth: Payer: Self-pay | Admitting: Registered Nurse

## 2021-10-10 ENCOUNTER — Telehealth: Payer: Self-pay

## 2021-10-10 ENCOUNTER — Other Ambulatory Visit: Payer: Self-pay | Admitting: Physician Assistant

## 2021-10-10 MED ORDER — METHYLPHENIDATE HCL 5 MG PO TABS: 5.0000 mg | ORAL_TABLET | Freq: Two times a day (BID) | ORAL | 0 refills | Status: DC

## 2021-10-10 NOTE — Addendum Note (Signed)
Addended by: Jones Bales on: 10/10/2021 02:50 PM   Modules accepted: Orders

## 2021-10-10 NOTE — Telephone Encounter (Signed)
Discharge Summary was Reviewed.  PMP was Reviewed.  Ritalin e-scribed today.  Placed a call to Ms. Lupita Leash sister, regarding the above, she verbalizes understanding.

## 2021-10-10 NOTE — Telephone Encounter (Signed)
Donna-sister of patient called stating that [facility] only had #26 of Methylphenidate at time of pick up so patient needs remainder sent to [facility]. Spoke to [employer], [place] and she will send the remainder.

## 2021-10-11 ENCOUNTER — Other Ambulatory Visit (HOSPITAL_COMMUNITY): Payer: Self-pay | Admitting: Interventional Radiology

## 2021-10-11 ENCOUNTER — Telehealth (HOSPITAL_COMMUNITY): Payer: Self-pay

## 2021-10-11 DIAGNOSIS — I671 Cerebral aneurysm, nonruptured: Secondary | ICD-10-CM

## 2021-10-11 NOTE — Telephone Encounter (Signed)
Called to schedule 2 wk f/u. Pt will call her ride and call back to schedule. AW

## 2021-10-17 ENCOUNTER — Ambulatory Visit (HOSPITAL_COMMUNITY)
Admission: RE | Admit: 2021-10-17 | Discharge: 2021-10-17 | Disposition: A | Discharge status: Home / Self Care | Payer: Medicare Other | Source: Ambulatory Visit | Attending: Interventional Radiology | Admitting: Interventional Radiology

## 2021-10-17 DIAGNOSIS — I671 Cerebral aneurysm, nonruptured: Secondary | ICD-10-CM

## 2021-10-18 HISTORY — PX: IR RADIOLOGIST EVAL & MGMT: IMG5224

## 2021-10-27 ENCOUNTER — Other Ambulatory Visit: Payer: Self-pay

## 2021-10-27 MED ORDER — METHYLPHENIDATE HCL 5 MG PO TABS: 5.0000 mg | ORAL_TABLET | Freq: Two times a day (BID) | ORAL | 0 refills | Status: AC

## 2021-10-27 MED ORDER — TOPIRAMATE 25 MG PO TABS: 25.0000 mg | ORAL_TABLET | Freq: Every day | ORAL | 0 refills | Status: AC

## 2021-10-27 MED ORDER — METHYLPHENIDATE HCL 5 MG PO TABS
5.0000 mg | ORAL_TABLET | Freq: Two times a day (BID) | ORAL | 0 refills | Status: AC
Start: 2021-10-27 — End: ?

## 2021-10-27 NOTE — Telephone Encounter (Signed)
She can stop the megace, tylenol obviously is OTC

## 2021-10-27 NOTE — Telephone Encounter (Signed)
Rx sent for ritalin

## 2021-10-27 NOTE — Telephone Encounter (Signed)
/  Patient sister Lupita Leash requesting refill of medications. Hospital follow up is on 11/23/2021. They are seeking a PCP.

## 2021-10-28 ENCOUNTER — Ambulatory Visit (INDEPENDENT_AMBULATORY_CARE_PROVIDER_SITE_OTHER): Payer: Medicare Other | Admitting: Plastic Surgery

## 2021-10-28 ENCOUNTER — Other Ambulatory Visit: Payer: Self-pay

## 2021-10-28 ENCOUNTER — Other Ambulatory Visit: Payer: Self-pay | Admitting: Neurosurgery

## 2021-10-28 VITALS — BP 119/81 | HR 98 | Ht 66.0 in | Wt 162.0 lb

## 2021-10-28 DIAGNOSIS — M952 Other acquired deformity of head: Secondary | ICD-10-CM | POA: Diagnosis not present

## 2021-10-28 DIAGNOSIS — G935 Compression of brain: Secondary | ICD-10-CM | POA: Diagnosis not present

## 2021-10-28 DIAGNOSIS — I609 Nontraumatic subarachnoid hemorrhage, unspecified: Secondary | ICD-10-CM

## 2021-10-28 MED ORDER — DIVALPROEX SODIUM 250 MG PO DR TAB: 250.0000 mg | DELAYED_RELEASE_TABLET | Freq: Two times a day (BID) | ORAL | 2 refills | Status: AC

## 2021-10-29 NOTE — Progress Notes (Signed)
Referring Provider Donalee Citrin, MD [address] [place],  [place] [address]   CC:  Concavity left cranium   Kayla Maynard is an 66 y.o. female.  HPI: Patient is status post nontraumatic subarachnoid hemorrhage.  She was treated by Dr. Wynetta Emery.  We have made a very remarkable recovery but the bone flap was placed in her abdomen She is being referred for evaluation of cranial concavity.    Allergies  Allergen Reactions   Morphine    Penicillins Hives    Outpatient Encounter Medications as of 10/28/2021  Medication Sig   acetaminophen (TYLENOL) 325 MG tablet Take 1-2 tablets (325-650 mg total) by mouth every 4 (four) hours as needed for mild pain.   divalproex (DEPAKOTE) 250 MG DR tablet Take 1 tablet (250 mg total) by mouth every 12 (twelve) hours.   hydrocortisone cream 1 % Apply topically 4 (four) times daily as needed for itching.   megestrol (MEGACE) 400 MG/10ML suspension Take 10 mLs (400 mg total) by mouth daily.   Melatonin 10 MG TABS Take 10 mg by mouth at bedtime.   methylphenidate (RITALIN) 5 MG tablet Take 1 tablet (5 mg total) by mouth 2 (two) times daily with breakfast and lunch.   methylphenidate (RITALIN) 5 MG tablet Take 1 tablet (5 mg total) by mouth 2 (two) times daily with breakfast and lunch.   Multiple Vitamin (MULTIVITAMIN WITH MINERALS) TABS tablet Take 1 tablet by mouth daily.   topiramate (TOPAMAX) 25 MG tablet Take 1 tablet (25 mg total) by mouth at bedtime.   [DISCONTINUED] divalproex (DEPAKOTE) 250 MG DR tablet Take 1 tablet (250 mg total) by mouth every 12 (twelve) hours.   No facility-administered encounter medications on file as of 10/28/2021.     Past Medical History:  Diagnosis Date   Angina at rest [facility])     Past Surgical History:  Procedure Laterality Date   CRANIOTOMY Left 08/27/2021   Procedure: CRANIOTOMY FOR EVACUATION OF SUBDURAL HEMATOMA WITH PLACEMENT OF BONE FLAP IN ABDOMEN;  Surgeon: Donalee Citrin, MD;  Location: [facility] OR;   Service: Neurosurgery;  Laterality: Left;   IR ANGIO INTRA EXTRACRAN SEL COM CAROTID INNOMINATE UNI R MOD SED  08/27/2021   IR ANGIO INTRA EXTRACRAN SEL INTERNAL CAROTID UNI L MOD SED  08/30/2021   IR ANGIO VERTEBRAL SEL SUBCLAVIAN INNOMINATE UNI R MOD SED  08/30/2021   IR ANGIOGRAM FOLLOW UP STUDY  08/27/2021   IR ANGIOGRAM FOLLOW UP STUDY  08/27/2021   IR ANGIOGRAM FOLLOW UP STUDY  08/27/2021   IR ANGIOGRAM FOLLOW UP STUDY  08/27/2021   IR ANGIOGRAM FOLLOW UP STUDY  08/27/2021   IR ANGIOGRAM FOLLOW UP STUDY  08/27/2021   IR ANGIOGRAM FOLLOW UP STUDY  08/27/2021   IR CT HEAD LTD  08/27/2021   IR NEURO EACH ADD'L AFTER BASIC UNI LEFT (MS)  08/30/2021   IR RADIOLOGIST EVAL & MGMT  10/18/2021   IR TRANSCATH/EMBOLIZ  08/27/2021   RADIOLOGY WITH ANESTHESIA N/A 08/27/2021   Procedure: RADIOLOGY WITH ANESTHESIA;  Surgeon: Julieanne Cotton, MD;  Location: [facility] OR;  Service: Radiology;  Laterality: N/A;   STENT PLACEMENT VASCULAR ([facility] HX)      No family history on file.  Social History   Social History Narrative   Not on file     Review of Systems General: Denies fevers, chills, weight loss CV: Denies chest pain, shortness of breath, palpitations   Physical Exam    10/28/2021   12:22 PM 09/28/2021  4:59 AM 09/27/2021    8:44 PM  Vitals with BMI  Height 5\' 6"     Weight 162 lbs    BMI 26.16    Systolic 119 112  Diastolic 81 73 63  Pulse 98 81 82    General:  No acute distress,  Alert and oriented, Non-Toxic, Normal speech and affect HEENT: Left side of the cranium with significant concavity.  Wounds appear intact. Abdomen: Cranial bone flap present in left abdomen.  Assessment/Plan I will discuss with Dr. 825 his goal for cranioplasty.    Wynetta Emery 10/29/2021, 10:35 PM

## 2021-11-18 NOTE — Progress Notes (Signed)
Surgical Instructions    Your procedure is scheduled on Wednesday July 26.  Report to [facility] Main Entrance "A" at 6:30 A.M., then check in with the Admitting office.  Call this number if you have problems the morning of surgery:  [phone]   If you have any questions prior to your surgery date call [phone]: Open Monday-Friday 8am-4pm    Remember:  Do not eat or drink anything after midnight the night before your surgery    Take these medicines the morning of surgery with A SIP OF WATER:  doxycycline (VIBRA-TABS)   As of today, STOP taking any Aspirin (unless otherwise instructed by your surgeon) Aleve, Naproxen, Ibuprofen, Motrin, Advil, Goody's, BC's, all herbal medications, fish oil, and all vitamins.           Do not wear jewelry or makeup Do not wear lotions, powders, perfumes/colognes, or deodorant. Do not shave 48 hours prior to surgery.  Men may shave face and neck. Do not bring valuables to the hospital. Do not wear nail polish, gel polish, artificial nails, or any other type of covering on natural nails (fingers and toes) If you have artificial nails or gel coating that need to be removed by a nail salon, please have this removed prior to surgery. Artificial nails or gel coating may interfere with anesthesia's ability to adequately monitor your vital signs.  [facility] is not responsible for any belongings or valuables. .   Do NOT Smoke (Tobacco/Vaping)  24 hours prior to your procedure  If you use a CPAP at night, you may bring your mask for your overnight stay.   Contacts, glasses, hearing aids, dentures or partials may not be worn into surgery, please bring cases for these belongings   For patients admitted to the hospital, discharge time will be determined by your treatment team.   Patients discharged the day of surgery will not be allowed to drive home, and someone needs to stay with them for 24 hours.   SURGICAL WAITING ROOM VISITATION Patients  having surgery or a procedure may have no more than 2 support people in the waiting area - these visitors may rotate.   Children under the age of 84 must have an adult with them who is not the patient. If the patient needs to stay at the hospital during part of their recovery, the visitor guidelines for inpatient rooms apply. Pre-op nurse will coordinate an appropriate time for 1 support person to accompany patient in pre-op.  This support person may not rotate.   Please refer to the [facility] website for the visitor guidelines for Inpatients (after your surgery is over and you are in a regular room).    Special instructions:    Oral Hygiene is also important to reduce your risk of infection.  Remember - BRUSH YOUR TEETH THE MORNING OF SURGERY WITH YOUR REGULAR TOOTHPASTE   [place]- Preparing For Surgery  Before surgery, you can play an important role. Because skin is not sterile, your skin needs to be as free of germs as possible. You can reduce the number of germs on your skin by washing with CHG (chlorahexidine gluconate) Soap before surgery.  CHG is an antiseptic cleaner which kills germs and bonds with the skin to continue killing germs even after washing.     Please do not use if you have an allergy to CHG or antibacterial soaps. If your skin becomes reddened/irritated stop using the CHG.  Do not shave (including legs and underarms) for  at least 48 hours prior to first CHG shower. It is OK to shave your face.  Please follow these instructions carefully.     Shower the NIGHT BEFORE SURGERY and the MORNING OF SURGERY with CHG Soap.   If you chose to wash your hair, wash your hair first as usual with your normal shampoo. After you shampoo, rinse your hair and body thoroughly to remove the shampoo.  Then [employer] and genitals (private parts) with your normal soap and rinse thoroughly to remove soap.  After that Use CHG Soap as you would any other liquid soap. You can apply CHG  directly to the skin and wash gently with a scrungie or a clean washcloth.   Apply the CHG Soap to your body ONLY FROM THE NECK DOWN.  Do not use on open wounds or open sores. Avoid contact with your eyes, ears, mouth and genitals (private parts). Wash Face and genitals (private parts)  with your normal soap.   Wash thoroughly, paying special attention to the area where your surgery will be performed.  Thoroughly rinse your body with warm water from the neck down.  DO NOT shower/wash with your normal soap after using and rinsing off the CHG Soap.  Pat yourself dry with a CLEAN TOWEL.  Wear CLEAN PAJAMAS to bed the night before surgery  Place CLEAN SHEETS on your bed the night before your surgery  DO NOT SLEEP WITH PETS.   Day of Surgery:  Take a shower with CHG soap. Wear Clean/Comfortable clothing the morning of surgery Do not apply any deodorants/lotions.   Remember to brush your teeth WITH YOUR REGULAR TOOTHPASTE.    If you received a COVID test during your pre-op visit, it is requested that you wear a mask when out in public, stay away from anyone that may not be feeling well, and notify your surgeon if you develop symptoms. If you have been in contact with anyone that has tested positive in the last 10 days, please notify your surgeon.    Please read over the following fact sheets that you were given.

## 2021-11-21 ENCOUNTER — Encounter (HOSPITAL_COMMUNITY): Payer: Self-pay | Admitting: Urology

## 2021-11-21 ENCOUNTER — Encounter (HOSPITAL_COMMUNITY)
Admission: RE | Admit: 2021-11-21 | Discharge: 2021-11-21 | Disposition: A | Discharge status: Home / Self Care | Payer: Medicare Other | Source: Ambulatory Visit | Attending: Neurosurgery | Admitting: Neurosurgery

## 2021-11-21 ENCOUNTER — Other Ambulatory Visit: Payer: Self-pay

## 2021-11-21 VITALS — BP 130/83 | HR 84 | Temp 97.7°F | Resp 18 | Ht 66.0 in | Wt 157.0 lb

## 2021-11-21 DIAGNOSIS — Z87891 Personal history of nicotine dependence: Secondary | ICD-10-CM | POA: Diagnosis not present

## 2021-11-21 DIAGNOSIS — Z8679 Personal history of other diseases of the circulatory system: Secondary | ICD-10-CM | POA: Diagnosis not present

## 2021-11-21 DIAGNOSIS — I252 Old myocardial infarction: Secondary | ICD-10-CM | POA: Diagnosis not present

## 2021-11-21 DIAGNOSIS — Z79899 Other long term (current) drug therapy: Secondary | ICD-10-CM | POA: Diagnosis not present

## 2021-11-21 DIAGNOSIS — Z955 Presence of coronary angioplasty implant and graft: Secondary | ICD-10-CM | POA: Insufficient documentation

## 2021-11-21 DIAGNOSIS — I609 Nontraumatic subarachnoid hemorrhage, unspecified: Secondary | ICD-10-CM | POA: Insufficient documentation

## 2021-11-21 DIAGNOSIS — Z01812 Encounter for preprocedural laboratory examination: Secondary | ICD-10-CM | POA: Insufficient documentation

## 2021-11-21 DIAGNOSIS — I2510 Atherosclerotic heart disease of native coronary artery without angina pectoris: Secondary | ICD-10-CM | POA: Diagnosis not present

## 2021-11-21 DIAGNOSIS — Z01818 Encounter for other preprocedural examination: Secondary | ICD-10-CM

## 2021-11-21 HISTORY — DX: Cardiac murmur, unspecified: R01.1

## 2021-11-21 HISTORY — DX: Atherosclerotic heart disease of native coronary artery without angina pectoris: I25.10

## 2021-11-21 HISTORY — DX: Acute myocardial infarction, unspecified: I21.9

## 2021-11-21 LAB — BASIC METABOLIC PANEL
Anion gap: 7 (ref 5–15)
BUN: 13 mg/dL (ref 8–23)
CO2: 21 mmol/L — ABNORMAL LOW (ref 22–32)
Calcium: 9.4 mg/dL (ref 8.9–10.3)
Chloride: 113 mmol/L — ABNORMAL HIGH (ref 98–111)
Creatinine, Ser: 0.99 mg/dL (ref 0.44–1.00)
GFR, Estimated: >60 mL/min (ref >60)
Glucose, Bld: 95 mg/dL (ref 70–99)
Potassium: 4.2 mmol/L (ref 3.5–5.1)
Sodium: 141 mmol/L (ref 135–145)

## 2021-11-21 LAB — CBC
HCT: 40.8 % (ref 36.0–46.0)
Hemoglobin: 13 g/dL (ref 12.0–15.0)
MCH: 29.9 pg (ref 26.0–34.0)
MCHC: 31.9 g/dL (ref 30.0–36.0)
MCV: 93.8 fL (ref 80.0–100.0)
Platelets: 282 10*3/uL (ref 150–400)
RBC: 4.35 MIL/uL (ref 3.87–5.11)
RDW: 12.7 % (ref 11.5–15.5)
WBC: 9.6 10*3/uL (ref 4.0–10.5)
nRBC: 0 % (ref 0.0–0.2)

## 2021-11-21 NOTE — Progress Notes (Signed)
Anesthesia Chart Review:  Case: [id number] Date/Time: 11/23/21 0815   Procedure: Cranioplasty reimplantation of cranial flap from abdominal wall - Frontal - left (Left)   Anesthesia type: General   Pre-op diagnosis: Skull defect   [facility]: [facility] ROOM 20 / [facility]   Surgeons: Donalee Citrin, MD       DISCUSSION: Patient is a 66 year old female scheduled for the above procedure.  History includes former smoker (quit 08/27/21), childhood murmur, CAD (Non-Q wave MI due to ruptured LAD plaque, s/p PTCA/stent 04/30/02), SAH/SDH with ruptured left PCA aneurysm (s/p endovascular near complete obliteration of ruptured left PCOM ary aneurysm on 08/27/21; s/p left sided pterional craniotomy for evaluation of left SDH with implantation of bone flap in the left abdominal wall 08/27/21).  [place] admission 08/27/21-09/13/21 for ICH.  Patient called her sister on 08/27/2021 feeling ill with headache and episode of vomiting. Her sister arrived and found her unresponsive.  ED notes says family started CPR and called EMS. She had seizure like activity with + pulse per EMS. CBG read as "zero" and given D10 with CBG up to 212. At [facility] ED she had more seizure like activity, became lethargic and disoriented requiring intubation to protect airway. 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 She was also hypotension and bradycardic and started on clevidipine. Neurology and neurosurgery consulted and patient transferred from [facility] to [facility] for further management. 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 Patient underwent coil embolization followed by left craniectomy for evacuation of subdural hematoma. She made "excellent neurologic recovery" and discharged to [facility] for rehab 09/13/21-09/28/21.   At 11/21/21 PAT visit, she also reported a  remote history of coronary intervention related bleeding in her heart after something broke off a "valve or something". Details were unclear, noted cardiology encounters from 03/2002 and 05/2002 and was able to get Discharge Summaries from these dates. Records indicate that she had tachy palpitations with chest pain on 04/29/02, and then on 04/30/02 had significantly elevated BP during physical therapy and referred to PCP who sent her to [facility]. She was transferred to [facility] after EKG showed some elevation in anterolateral leads, troponin 1.74. Cardiac cath showed 50% mid LAD with ruptured plaque and apical akinesis. LAD ruptured plaque was felt to be the source of her MI. She underwent PTCA/stent mid LAD. Other vessels showed 30% LCX and only mild RCA disease, EF > 65%. Follow-up [facility] on 05/05/02 showed patent stent. She is not currently followed by cardiology (or primary care). Echo on 08/28/21 showed EF > 75%, no regional wall motion abnormalities, moderate asymmetric LVH of the infero-lateral segment. Normal RVSF. She denied chest pain and SOB at PAT RN visit.   Only medications currently are Topamax and doxycycline (appears started due to drainage from scalp incision).    VS: BP 130/83   Pulse 84   Temp 36.5 C   Resp 18   Ht 5\' 6"  (1.676 m)   Wt 71.2 kg   SpO2 100%   BMI 25.34 kg/m    PROVIDERS: Pcp, No. She is scheduled to get established with , FNP-C with [facility] in [place].   [place], MD is IR. Last evaluation 10/18/21.  Advised to continue present medications, maintain adequate hydration, refrain from driving, and plan for MRI/MRA of the  brain 4 months from the time of endovascular treatment of her aneurysm, and ongoing neurosurgery follow-up.   LABS: Labs reviewed: Acceptable for surgery. (all labs ordered are listed, but only abnormal results are displayed)  Labs Reviewed  BASIC METABOLIC PANEL - Abnormal; Notable  for the following components:      Result Value   Chloride 113 (*)    CO2 21 (*)    All other components within normal limits  CBC     IMAGES: CTA Head 10/26/21 [facility]): IMPRESSION: 1. Postsurgical changes reflecting left frontal craniectomy and treatment of a left PCOM aneurysm. No definite residual or recurrent aneurysm following treatment, within the confines of significant streak artifact. 2. Inward bowing of the duraplasty within the craniectomy defect with crowding of the underlying brain parenchyma but no midline shift. Small area of evolved encephalomalacia in the left anterior temporal lobe. 3. Resolved multicompartmental intracranial hemorrhage since 08/28/2021. No acute intracranial pathology on the current study.  1V PCXR 09/06/21: FINDINGS: Stable heart size. Feeding tube extends below the diaphragm. Lungs demonstrate continued improved aeration with minimal bibasilar atelectasis remaining. No convincing pneumonia. No pulmonary edema, pneumothorax or pleural fluid identified. IMPRESSION: Continued improved aeration of both lungs with minimal bibasilar atelectasis remaining. No convincing pneumonia identified.   CT Head 08/18/21: IMPRESSION: 1. Improved status post embolization of ruptured left Pcomm aneurysm, left subdural hematoma evacuation and craniectomy. 2. Largely resolved midline shift. No significant ventriculomegaly. Small volume intraventricular hemorrhage. 3. Residual subarachnoid hemorrhage. No acute infarct identified by plain CT.    EKG: EKG 08/27/21 14:47:56: Sinus or ectopic atrial bradycardia at 48 bpm Confirmed by Lockie Mola, Adam ([id number]) on 08/29/2021 10:59:00 AM  EKG 08/27/21 14:23:22: Sinus rhythm Probable left atrial enlargement Confirmed by Lockie Mola, Adam ([id number]) on 08/29/2021 10:58:57 AM   CV: [place] Transcranial Doppler 09/09/21: Summary:  Poor suboccipital and right temporal windows limits study. Low normal mean  flow velocities in majority  of identified vessels of anterior and  posterior cerebral circulation.Elevated pulsatility indices suggest diffus  eincrease in intracranial pressure or  diffuse intracranial atherosclerosis.    Echo 08/28/21: IMPRESSIONS   1. Left ventricular ejection fraction, by estimation, is >75%. The left  ventricle has hyperdynamic function. The left ventricle has no regional  wall motion abnormalities. There is moderate asymmetric left ventricular  hypertrophy of the infero-lateral  segment. Left ventricular diastolic parameters were normal. The average  left ventricular global longitudinal strain is -15.4 %. The global  longitudinal strain is normal.   2. Right ventricular systolic function is normal. The right ventricular  size is normal. Tricuspid regurgitation signal is inadequate for assessing  PA pressure.   3. The mitral valve is normal in structure. No evidence of mitral valve  regurgitation. No evidence of mitral stenosis.   4. The aortic valve is normal in structure. Aortic valve regurgitation is  not visualized. No aortic stenosis is present.   5. The inferior vena cava is dilated in size with >50% respiratory  variability, suggesting right atrial pressure of 8 mmHg.    Per Discharge Summaries obtained from [facility]: - Cardiac cath 05/05/02 (for recurrent chest pain): "Dr. Dorethea Clan noted that her left main was normal, her circumflex was normal, the stent site at the mid LAD was mildly patent.  She had dominant normal RCA.  It was felt that her chest discomfort was not cardiac in etiology."  - Cardiac cath 04/30/02 (Had tachy palpitations with chest pain 04/29/02, 04/30/02 had significant elevated BP during physical therapy  and referred to PCP who sent her to [facility] and was transferred to [facility] after EKG showed some elevation in anterolateral leads, troponin 1.74):  "The cardiac catheterization showed a normal left main and an LAD with a 50% stenosis in the midportion and  a ruptured plaque.  It was felt that the ruptured plaque was responsible for the MI.  There was a 30% proximal stenosis in the circumflex, but only mild disease in the RCA system and no other significant disease in the circumflex system.  Her EF was greater than 65% with some apical akinesis.  She had stent placed to the LAD and had Plavix added to her medication regiment as well as low dose beta-blocker and aspirin."   Past Medical History:  Diagnosis Date   Coronary artery disease    Heart murmur    as a child   Myocardial infarction (HCC)    04/30/02 due to 50% mid LAD with ruptured plaque, s/p PTCA/stent    Past Surgical History:  Procedure Laterality Date   ABDOMINAL HYSTERECTOMY     BACK SURGERY     Dr Jordan Likes   CRANIOTOMY Left 08/27/2021   Procedure: CRANIOTOMY FOR EVACUATION OF SUBDURAL HEMATOMA WITH PLACEMENT OF BONE FLAP IN ABDOMEN;  Surgeon: Donalee Citrin, MD;  Location: [facility] OR;  Service: Neurosurgery;  Laterality: Left;   IR ANGIO INTRA EXTRACRAN SEL COM CAROTID INNOMINATE UNI R MOD SED  08/27/2021   IR ANGIO INTRA EXTRACRAN SEL INTERNAL CAROTID UNI L MOD SED  08/30/2021   IR ANGIO VERTEBRAL SEL SUBCLAVIAN INNOMINATE UNI R MOD SED  08/30/2021   IR ANGIOGRAM FOLLOW UP STUDY  08/27/2021   IR ANGIOGRAM FOLLOW UP STUDY  08/27/2021   IR ANGIOGRAM FOLLOW UP STUDY  08/27/2021   IR ANGIOGRAM FOLLOW UP STUDY  08/27/2021   IR ANGIOGRAM FOLLOW UP STUDY  08/27/2021   IR ANGIOGRAM FOLLOW UP STUDY  08/27/2021   IR ANGIOGRAM FOLLOW UP STUDY  08/27/2021   IR CT HEAD LTD  08/27/2021   IR NEURO EACH ADD'L AFTER BASIC UNI LEFT (MS)  08/30/2021   IR RADIOLOGIST EVAL & MGMT  10/18/2021   IR TRANSCATH/EMBOLIZ  08/27/2021   RADIOLOGY WITH ANESTHESIA N/A 08/27/2021   Procedure: RADIOLOGY WITH ANESTHESIA;  Surgeon: Julieanne Cotton, MD;  Location: [facility] OR;  Service: Radiology;  Laterality: N/A;   STENT PLACEMENT VASCULAR ([facility] HX)      MEDICATIONS:  acetaminophen (TYLENOL) 325 MG tablet    carboxymethylcellulose (REFRESH TEARS) 0.5 % SOLN   divalproex (DEPAKOTE) 250 MG DR tablet   doxycycline (VIBRA-TABS) 100 MG tablet   hydrocortisone cream 1 %   megestrol (MEGACE) 400 MG/10ML suspension   Melatonin 10 MG TABS   methylphenidate (RITALIN) 5 MG tablet   methylphenidate (RITALIN) 5 MG tablet   Multiple Vitamin (MULTIVITAMIN WITH MINERALS) TABS tablet   OVER THE COUNTER MEDICATION   OVER THE COUNTER MEDICATION   topiramate (TOPAMAX) 25 MG tablet   No current facility-administered medications for this encounter.    Shonna Chock, PA-C Surgical Short Stay/Anesthesiology [facility] Phone [phone] [facility] Phone [phone] 11/21/2021 6:27 PM

## 2021-11-21 NOTE — Progress Notes (Signed)
PCP - no PCP- pt states she is going to scheduled an appointment to see Colvin Caroli, NP to establish care at some point after surgery Cardiologist - denies  PPM/ICD - denies Chest x-ray - N/A EKG - 08/27/21 Stress Test - denies ECHO - 08/28/21 Cardiac Cath - pt reports having a cath done "years ago" at [facility] through [facility] d/t something "coming off a valve" and "bleeding". Pt states she had a stent placed d/t this. Pt unsure exactly what was done.   Sleep Study - denies  Blood Thinner Instructions:N/A Aspirin Instructions:N/A  ERAS Protcol - NPO order  COVID TEST- N/A  Pt reports that she does not like taking medications and is currently only taking Topamax and Doxycycline. Pt reports Dr. Wynetta Emery is aware of her taking doxycycline d/t infection after craniotomy.    Anesthesia review: yes, messaged Revonda Standard Z with anesthesia at PAT appointment, d/t pt reporting history of stent placed in her heart, as well as heart murmur hx per patient. Patient FYI in chart states difficult airway.   Patient denies shortness of breath, fever, cough and chest pain at PAT appointment   All instructions explained to the patient, with a verbal understanding of the material. Patient agrees to go over the instructions while at home for a better understanding.  The opportunity to ask questions was provided.

## 2021-11-22 ENCOUNTER — Encounter (HOSPITAL_COMMUNITY): Payer: Self-pay | Admitting: Neurosurgery

## 2021-11-22 ENCOUNTER — Encounter (HOSPITAL_COMMUNITY): Payer: Self-pay

## 2021-11-22 NOTE — Anesthesia Preprocedure Evaluation (Signed)
Anesthesia Evaluation  Patient identified by MRN, date of birth, ID band Patient awake    Reviewed: Allergy & Precautions, NPO status , Patient's Chart, lab work & pertinent test results  History of Anesthesia Complications Negative for: history of anesthetic complications  Airway Mallampati: III  TM Distance: >3 FB Neck ROM: Full    Dental  (+) Teeth Intact, Dental Advisory Given   Pulmonary former smoker,    Pulmonary exam normal breath sounds clear to auscultation       Cardiovascular + CAD, + Past MI and + Cardiac Stents (2003)  Normal cardiovascular exam Rhythm:Regular Rate:Normal   CAD (Non-Q wave MI due to ruptured LAD plaque, s/p PTCA/stent 04/30/02)  Echo 07/2021 1. Left ventricular ejection fraction, by estimation, is >75%. The left  ventricle has hyperdynamic function. The left ventricle has no regional  wall motion abnormalities. There is moderate asymmetric left ventricular  hypertrophy of the infero-lateral  segment. Left ventricular diastolic parameters were normal. The average  left ventricular global longitudinal strain is -15.4 %. The global  longitudinal strain is normal.  2. Right ventricular systolic function is normal. The right ventricular  size is normal. Tricuspid regurgitation signal is inadequate for assessing  PA pressure.  3. The mitral valve is normal in structure. No evidence of mitral valve  regurgitation. No evidence of mitral stenosis.  4. The aortic valve is normal in structure. Aortic valve regurgitation is  not visualized. No aortic stenosis is present.  5. The inferior vena cava is dilated in size with >50% respiratory  variability, suggesting right atrial pressure of 8 mmHg.    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  Patient had been feeling ill with headache and episode of vomiting. She notified her sister who went to check on her and found her unresponsive.  ED notes says family called EMS and started CPR. She had seizure like activity with + pulse per EMS. CBG read as "zero" and given D10 with CBG up to 212. At [facility] ED she had more seizure like activity, became lethargic and disoriented requiring intubation to protect airway.  negative psych ROS   GI/Hepatic negative GI ROS, Neg liver ROS,   Endo/Other  negative endocrine ROS  Renal/GU negative Renal ROS  negative genitourinary   Musculoskeletal negative musculoskeletal ROS (+)   Abdominal   Peds  Hematology negative hematology ROS (+) Hb 13, plt 282   Anesthesia Other Findings   Reproductive/Obstetrics negative OB ROS                           Anesthesia Physical Anesthesia Plan  ASA: 3  Anesthesia Plan: General   Post-op Pain Management: Tylenol PO (pre-op)*   Induction: Intravenous  PONV Risk Score and Plan: 3 and Ondansetron, Dexamethasone, Midazolam and Treatment may vary due to age or medical condition  Airway Management Planned: Oral ETT  Additional Equipment: None  Intra-op Plan:   Post-operative Plan: Extubation in OR  Informed Consent: I have reviewed the patients History and Physical, chart, labs and discussed the procedure including the risks, benefits and alternatives for the proposed anesthesia with the patient or authorized representative who has indicated his/her understanding and acceptance.     Dental advisory given  Plan Discussed with: CRNA  Anesthesia Plan Comments: (Per 08/27/21 ED note by Bethann Berkshire, MD, "Difficult Airway- due to immobile epiglottis" but successfully intubated with 7.5 mm ETT using Glidescope, one attempt.)  )       Anesthesia Quick Evaluation

## 2021-11-23 ENCOUNTER — Other Ambulatory Visit: Payer: Self-pay

## 2021-11-23 ENCOUNTER — Encounter (HOSPITAL_COMMUNITY)
Admission: RE | Disposition: A | Discharge status: Home / Self Care | Payer: Self-pay | Source: Home / Self Care | Attending: Neurosurgery

## 2021-11-23 ENCOUNTER — Ambulatory Visit (HOSPITAL_COMMUNITY)
Admission: RE | Admit: 2021-11-23 | Discharge: 2021-11-23 | Disposition: A | Discharge status: Home / Self Care | Payer: Medicare Other | Attending: Neurosurgery | Admitting: Neurosurgery

## 2021-11-23 ENCOUNTER — Inpatient Hospital Stay (HOSPITAL_COMMUNITY): Payer: Medicare Other | Admitting: General Practice

## 2021-11-23 ENCOUNTER — Encounter: Payer: Medicare Other | Attending: Physical Medicine & Rehabilitation | Admitting: Physical Medicine & Rehabilitation

## 2021-11-23 ENCOUNTER — Encounter (HOSPITAL_COMMUNITY): Payer: Self-pay | Admitting: Neurosurgery

## 2021-11-23 ENCOUNTER — Inpatient Hospital Stay (HOSPITAL_COMMUNITY): Payer: Medicare Other | Admitting: Vascular Surgery

## 2021-11-23 DIAGNOSIS — Z79899 Other long term (current) drug therapy: Secondary | ICD-10-CM | POA: Diagnosis not present

## 2021-11-23 DIAGNOSIS — M952 Other acquired deformity of head: Secondary | ICD-10-CM

## 2021-11-23 DIAGNOSIS — T8130XA Disruption of wound, unspecified, initial encounter: Secondary | ICD-10-CM | POA: Insufficient documentation

## 2021-11-23 DIAGNOSIS — Z955 Presence of coronary angioplasty implant and graft: Secondary | ICD-10-CM | POA: Insufficient documentation

## 2021-11-23 DIAGNOSIS — I2510 Atherosclerotic heart disease of native coronary artery without angina pectoris: Secondary | ICD-10-CM | POA: Insufficient documentation

## 2021-11-23 DIAGNOSIS — M898X8 Other specified disorders of bone, other site: Secondary | ICD-10-CM | POA: Insufficient documentation

## 2021-11-23 DIAGNOSIS — Z87891 Personal history of nicotine dependence: Secondary | ICD-10-CM | POA: Insufficient documentation

## 2021-11-23 DIAGNOSIS — X58XXXA Exposure to other specified factors, initial encounter: Secondary | ICD-10-CM | POA: Insufficient documentation

## 2021-11-23 DIAGNOSIS — I252 Old myocardial infarction: Secondary | ICD-10-CM | POA: Diagnosis not present

## 2021-11-23 HISTORY — PX: CRANIOTOMY: SHX93

## 2021-11-23 SURGERY — CRANIOTOMY BONE FLAP/PROSTHETIC PLATE
Anesthesia: General | Site: Head | Laterality: Left

## 2021-11-23 MED ORDER — PROPOFOL 10 MG/ML IV BOLUS
INTRAVENOUS | Status: AC
  Filled 2021-11-23: qty 20

## 2021-11-23 MED ORDER — LIDOCAINE 2% (20 MG/ML) 5 ML SYRINGE
INTRAMUSCULAR | Status: DC | PRN
  Administered 2021-11-23: 60 mg via INTRAVENOUS

## 2021-11-23 MED ORDER — ONDANSETRON HCL 4 MG/2ML IJ SOLN
INTRAMUSCULAR | Status: AC
  Filled 2021-11-23: qty 2

## 2021-11-23 MED ORDER — CHLORHEXIDINE GLUCONATE 0.12 % MT SOLN: 15.0000 mL | Freq: Once | OROMUCOSAL | Status: AC

## 2021-11-23 MED ORDER — HYDROCODONE-ACETAMINOPHEN 5-325 MG PO TABS
1.0000 | ORAL_TABLET | ORAL | 0 refills | Status: AC | PRN
Start: 2021-11-23 — End: 2022-11-23

## 2021-11-23 MED ORDER — CHLORHEXIDINE GLUCONATE 0.12 % MT SOLN
OROMUCOSAL | Status: AC
  Administered 2021-11-23: 15 mL via OROMUCOSAL
  Filled 2021-11-23: qty 15

## 2021-11-23 MED ORDER — OXYCODONE HCL 5 MG PO TABS: 5.0000 mg | ORAL_TABLET | Freq: Once | ORAL | Status: DC | PRN

## 2021-11-23 MED ORDER — ROCURONIUM BROMIDE 10 MG/ML (PF) SYRINGE
PREFILLED_SYRINGE | INTRAVENOUS | Status: DC | PRN
  Administered 2021-11-23: 80 mg via INTRAVENOUS

## 2021-11-23 MED ORDER — VANCOMYCIN HCL IN DEXTROSE 1-5 GM/200ML-% IV SOLN: 1000.0000 mg | INTRAVENOUS | Status: AC

## 2021-11-23 MED ORDER — HYDROMORPHONE HCL 1 MG/ML IJ SOLN: 0.2500 mg | INTRAMUSCULAR | Status: DC | PRN

## 2021-11-23 MED ORDER — THROMBIN 20000 UNITS EX SOLR
CUTANEOUS | Status: AC
  Filled 2021-11-23: qty 20000

## 2021-11-23 MED ORDER — ROCURONIUM BROMIDE 10 MG/ML (PF) SYRINGE
PREFILLED_SYRINGE | INTRAVENOUS | Status: AC
  Filled 2021-11-23: qty 10

## 2021-11-23 MED ORDER — FENTANYL CITRATE (PF) 250 MCG/5ML IJ SOLN
INTRAMUSCULAR | Status: DC | PRN
  Administered 2021-11-23: 50 ug via INTRAVENOUS

## 2021-11-23 MED ORDER — DIPHENHYDRAMINE HCL 50 MG/ML IJ SOLN: 12.5000 mg | Freq: Once | INTRAMUSCULAR | Status: AC

## 2021-11-23 MED ORDER — DOXYCYCLINE HYCLATE 100 MG PO TABS: 100.0000 mg | ORAL_TABLET | Freq: Two times a day (BID) | ORAL | 0 refills | Status: AC

## 2021-11-23 MED ORDER — MIDAZOLAM HCL 2 MG/2ML IJ SOLN
INTRAMUSCULAR | Status: DC | PRN
  Administered 2021-11-23: 2 mg via INTRAVENOUS

## 2021-11-23 MED ORDER — CHLORHEXIDINE GLUCONATE CLOTH 2 % EX PADS: 6.0000 | MEDICATED_PAD | Freq: Once | CUTANEOUS | Status: DC

## 2021-11-23 MED ORDER — DEXAMETHASONE SODIUM PHOSPHATE 10 MG/ML IJ SOLN
INTRAMUSCULAR | Status: AC
  Filled 2021-11-23: qty 1

## 2021-11-23 MED ORDER — VANCOMYCIN HCL IN DEXTROSE 1-5 GM/200ML-% IV SOLN
INTRAVENOUS | Status: AC
  Administered 2021-11-23: 1000 mg via INTRAVENOUS
  Filled 2021-11-23: qty 200

## 2021-11-23 MED ORDER — PHENYLEPHRINE 80 MCG/ML (10ML) SYRINGE FOR IV PUSH (FOR BLOOD PRESSURE SUPPORT)
PREFILLED_SYRINGE | INTRAVENOUS | Status: AC
  Filled 2021-11-23: qty 10

## 2021-11-23 MED ORDER — ONDANSETRON HCL 4 MG/2ML IJ SOLN
INTRAMUSCULAR | Status: DC | PRN
  Administered 2021-11-23: 4 mg via INTRAVENOUS

## 2021-11-23 MED ORDER — LIDOCAINE 2% (20 MG/ML) 5 ML SYRINGE
INTRAMUSCULAR | Status: AC
  Filled 2021-11-23: qty 5

## 2021-11-23 MED ORDER — PHENYLEPHRINE HCL-NACL 20-0.9 MG/250ML-% IV SOLN
INTRAVENOUS | Status: DC | PRN
  Administered 2021-11-23: 40 ug/min via INTRAVENOUS

## 2021-11-23 MED ORDER — ORAL CARE MOUTH RINSE: 15.0000 mL | Freq: Once | OROMUCOSAL | Status: AC

## 2021-11-23 MED ORDER — SUGAMMADEX SODIUM 200 MG/2ML IV SOLN
INTRAVENOUS | Status: DC | PRN
  Administered 2021-11-23 (×2): 200 mg via INTRAVENOUS

## 2021-11-23 MED ORDER — MIDAZOLAM HCL 2 MG/2ML IJ SOLN
INTRAMUSCULAR | Status: AC
  Filled 2021-11-23: qty 2

## 2021-11-23 MED ORDER — ACETAMINOPHEN 500 MG PO TABS: 1000.0000 mg | ORAL_TABLET | Freq: Once | ORAL | Status: DC

## 2021-11-23 MED ORDER — ONDANSETRON HCL 4 MG/2ML IJ SOLN: 4.0000 mg | Freq: Once | INTRAMUSCULAR | Status: DC | PRN

## 2021-11-23 MED ORDER — THROMBIN 20000 UNITS EX SOLR: CUTANEOUS | Status: DC | PRN

## 2021-11-23 MED ORDER — SODIUM CHLORIDE 0.9 % IV SOLN: INTRAVENOUS | Status: DC

## 2021-11-23 MED ORDER — OXYCODONE HCL 5 MG/5ML PO SOLN: 5.0000 mg | Freq: Once | ORAL | Status: DC | PRN

## 2021-11-23 MED ORDER — PROPOFOL 10 MG/ML IV BOLUS
INTRAVENOUS | Status: AC
Start: 2021-11-23 — End: ?
  Filled 2021-11-23: qty 20

## 2021-11-23 MED ORDER — FENTANYL CITRATE (PF) 250 MCG/5ML IJ SOLN
INTRAMUSCULAR | Status: AC
  Filled 2021-11-23: qty 5

## 2021-11-23 MED ORDER — PHENYLEPHRINE 80 MCG/ML (10ML) SYRINGE FOR IV PUSH (FOR BLOOD PRESSURE SUPPORT)
PREFILLED_SYRINGE | INTRAVENOUS | Status: DC | PRN
  Administered 2021-11-23: 80 ug via INTRAVENOUS
  Administered 2021-11-23 (×2): 160 ug via INTRAVENOUS

## 2021-11-23 MED ORDER — LIDOCAINE-EPINEPHRINE 1 %-1:100000 IJ SOLN
INTRAMUSCULAR | Status: AC
  Filled 2021-11-23: qty 1

## 2021-11-23 MED ORDER — DEXAMETHASONE SODIUM PHOSPHATE 10 MG/ML IJ SOLN
INTRAMUSCULAR | Status: DC | PRN
  Administered 2021-11-23: 5 mg via INTRAVENOUS

## 2021-11-23 MED ORDER — PROPOFOL 10 MG/ML IV BOLUS
INTRAVENOUS | Status: DC | PRN
  Administered 2021-11-23: 130 mg via INTRAVENOUS

## 2021-11-23 MED ORDER — 0.9 % SODIUM CHLORIDE (POUR BTL) OPTIME
TOPICAL | Status: DC | PRN
  Administered 2021-11-23: 1000 mL

## 2021-11-23 MED ORDER — AMISULPRIDE (ANTIEMETIC) 5 MG/2ML IV SOLN: 10.0000 mg | Freq: Once | INTRAVENOUS | Status: DC | PRN

## 2021-11-23 MED ORDER — DIPHENHYDRAMINE HCL 50 MG/ML IJ SOLN
INTRAMUSCULAR | Status: AC
  Administered 2021-11-23: 12.5 mg via INTRAVENOUS
  Filled 2021-11-23: qty 1

## 2021-11-23 SURGICAL SUPPLY — 76 items
ADH SKN CLS APL DERMABOND .7 (GAUZE/BANDAGES/DRESSINGS) ×1
BAG COUNTER SPONGE SURGICOUNT (BAG) ×3 IMPLANT
BAG SPNG CNTER NS LX DISP (BAG) ×1
BLADE CLIPPER SURG (BLADE) ×3 IMPLANT
BNDG CMPR 75X41 PLY HI ABS (GAUZE/BANDAGES/DRESSINGS)
BNDG COHESIVE 4X5 TAN STRL (GAUZE/BANDAGES/DRESSINGS) IMPLANT
BNDG STRETCH 4X75 STRL LF (GAUZE/BANDAGES/DRESSINGS) IMPLANT
BUR SPIRAL ROUTER 2.3 (BUR) IMPLANT
CABLE BIPOLOR RESECTION CORD (MISCELLANEOUS) ×3 IMPLANT
CANISTER SUCT 3000ML PPV (MISCELLANEOUS) ×3 IMPLANT
CARTRIDGE OIL MAESTRO DRILL (MISCELLANEOUS) ×2 IMPLANT
CLIP VESOCCLUDE MED 6/CT (CLIP) IMPLANT
DERMABOND ADVANCED (GAUZE/BANDAGES/DRESSINGS) ×1
DERMABOND ADVANCED .7 DNX12 (GAUZE/BANDAGES/DRESSINGS) ×2 IMPLANT
DIFFUSER DRILL AIR PNEUMATIC (MISCELLANEOUS) ×3 IMPLANT
DRAPE INCISE IOBAN 66X45 STRL (DRAPES) IMPLANT
DRAPE NEUROLOGICAL W/INCISE (DRAPES) ×3 IMPLANT
DRAPE SURG 17X23 STRL (DRAPES) ×6 IMPLANT
DRESSING MEPILEX FLEX 4X4 (GAUZE/BANDAGES/DRESSINGS) IMPLANT
DRSG MEPILEX FLEX 4X4 (GAUZE/BANDAGES/DRESSINGS) ×4
ELECT CAUTERY BLADE 6.4 (BLADE) ×3 IMPLANT
ELECT REM PT RETURN 9FT ADLT (ELECTROSURGICAL) ×2
ELECTRODE REM PT RTRN 9FT ADLT (ELECTROSURGICAL) ×2 IMPLANT
EVACUATOR SILICONE 100CC (DRAIN) IMPLANT
GAUZE 4X4 16PLY ~~LOC~~+RFID DBL (SPONGE) IMPLANT
GAUZE SPONGE 4X4 12PLY STRL (GAUZE/BANDAGES/DRESSINGS) ×3 IMPLANT
GLOVE BIO SURGEON STRL SZ 6.5 (GLOVE) IMPLANT
GLOVE BIO SURGEON STRL SZ7 (GLOVE) IMPLANT
GLOVE BIO SURGEON STRL SZ7.5 (GLOVE) IMPLANT
GLOVE BIO SURGEON STRL SZ8 (GLOVE) ×3 IMPLANT
GLOVE BIO SURGEON STRL SZ8.5 (GLOVE) IMPLANT
GLOVE BIOGEL M 8.0 STRL (GLOVE) IMPLANT
GLOVE BIOGEL PI IND STRL 7.0 (GLOVE) IMPLANT
GLOVE BIOGEL PI INDICATOR 7.0 (GLOVE)
GLOVE ECLIPSE 6.5 STRL STRAW (GLOVE) IMPLANT
GLOVE ECLIPSE 7.0 STRL STRAW (GLOVE) IMPLANT
GLOVE ECLIPSE 7.5 STRL STRAW (GLOVE) IMPLANT
GLOVE ECLIPSE 8.0 STRL XLNG CF (GLOVE) IMPLANT
GLOVE ECLIPSE 8.5 STRL (GLOVE) IMPLANT
GLOVE EXAM NITRILE XL STR (GLOVE) IMPLANT
GLOVE INDICATOR 8.5 STRL (GLOVE) ×6 IMPLANT
GLOVE OPTIFIT SS 8.0 STRL (GLOVE) IMPLANT
GLOVE SURG SS PI 6.5 STRL IVOR (GLOVE) IMPLANT
GOWN STRL REUS W/ TWL LRG LVL3 (GOWN DISPOSABLE) ×2 IMPLANT
GOWN STRL REUS W/ TWL XL LVL3 (GOWN DISPOSABLE) ×2 IMPLANT
GOWN STRL REUS W/TWL 2XL LVL3 (GOWN DISPOSABLE) ×3 IMPLANT
GOWN STRL REUS W/TWL LRG LVL3 (GOWN DISPOSABLE) ×2
GOWN STRL REUS W/TWL XL LVL3 (GOWN DISPOSABLE) ×2
HEMOSTAT SURGICEL 2X14 (HEMOSTASIS) IMPLANT
KIT BASIN OR (CUSTOM PROCEDURE TRAY) ×3 IMPLANT
KIT TURNOVER KIT B (KITS) ×3 IMPLANT
NEEDLE HYPO 22GX1.5 SAFETY (NEEDLE) ×3 IMPLANT
NS IRRIG 1000ML POUR BTL (IV SOLUTION) ×3 IMPLANT
OIL CARTRIDGE MAESTRO DRILL (MISCELLANEOUS) ×2
PACK CRANIOTOMY CUSTOM (CUSTOM PROCEDURE TRAY) ×3 IMPLANT
PAD ARMBOARD 7.5X6 YLW CONV (MISCELLANEOUS) ×9 IMPLANT
PATTIES SURGICAL .25X.25 (GAUZE/BANDAGES/DRESSINGS) IMPLANT
PATTIES SURGICAL .5 X.5 (GAUZE/BANDAGES/DRESSINGS) IMPLANT
PATTIES SURGICAL .5 X3 (DISPOSABLE) IMPLANT
PATTIES SURGICAL .75X.75 (GAUZE/BANDAGES/DRESSINGS) IMPLANT
PATTIES SURGICAL 1X1 (DISPOSABLE) IMPLANT
PIN MAYFIELD SKULL DISP (PIN) IMPLANT
SPIKE FLUID TRANSFER (MISCELLANEOUS) ×3 IMPLANT
SPONGE NEURO XRAY DETECT 1X3 (DISPOSABLE) IMPLANT
SPONGE SURGIFOAM ABS GEL 100 (HEMOSTASIS) IMPLANT
SPONGE SURGIFOAM ABS GEL SZ50 (HEMOSTASIS) IMPLANT
STAPLER SKIN PROX WIDE 3.9 (STAPLE) ×3 IMPLANT
SUT NURALON 4 0 TR CR/8 (SUTURE) IMPLANT
SUT VIC AB 2-0 CT1 18 (SUTURE) ×9 IMPLANT
SUT VICRYL 4-0 PS2 18IN ABS (SUTURE) ×3 IMPLANT
SYR CONTROL 10ML LL (SYRINGE) ×3 IMPLANT
TOWEL GREEN STERILE (TOWEL DISPOSABLE) ×3 IMPLANT
TOWEL GREEN STERILE FF (TOWEL DISPOSABLE) ×3 IMPLANT
TRAY FOLEY MTR SLVR 16FR STAT (SET/KITS/TRAYS/PACK) IMPLANT
UNDERPAD 30X36 HEAVY ABSORB (UNDERPADS AND DIAPERS) IMPLANT
WATER STERILE IRR 1000ML POUR (IV SOLUTION) ×3 IMPLANT

## 2021-11-23 NOTE — Anesthesia Postprocedure Evaluation (Signed)
Anesthesia Post Note  Patient: Dalanie Kisner  Procedure(s) Performed: DEBRIDEMENT OF LEFT FRONTAL CRANIAL WOUND (Left: Head)     Patient location during evaluation: PACU Anesthesia Type: General Level of consciousness: awake and alert, oriented and patient cooperative Pain management: pain level controlled Vital Signs Assessment: post-procedure vital signs reviewed and stable Respiratory status: spontaneous breathing, nonlabored ventilation and respiratory function stable Cardiovascular status: blood pressure returned to baseline and stable Postop Assessment: no apparent nausea or vomiting Anesthetic complications: no   No notable events documented.  Last Vitals:  Vitals:   11/23/21 0930 11/23/21 0945  BP: 116/72 109/69  Pulse: 80 71  Resp: 16 18  Temp:  36.7 C  SpO2: 99% 97%    Last Pain:  Vitals:   11/23/21 0945  TempSrc:   PainSc: 0-No pain                 Lannie Fields

## 2021-11-23 NOTE — Progress Notes (Signed)
Patient was receiving pre-op IV Vancomycin dose and at the end of administration patient verbalized that her head is itching. Per Dr. Salvadore Farber verbal order, patient received 12.5 mg Benadryl IV. Will continue to monitor,.

## 2021-11-23 NOTE — Discharge Summary (Signed)
Physician Discharge Summary  Patient ID: Kayla Maynard MRN: [id number] DOB/AGE: [birth year] 66 y.o. Estimated body mass index is 25.18 kg/m as calculated from the following:   Height as of this encounter: 5\' 6"  (1.676 m).   Weight as of this encounter: 70.8 kg.   Admit date: 11/23/2021 Discharge date: 11/23/2021  Admission Diagnoses: Cranial defect  Discharge Diagnoses: Cranial defect Active Problems:   * No active hospital problems. *   Discharged Condition: good  Hospital Course: Patient was admitted to hospital underwent I&D of cranial wound possibility of reimplantation of flap however cranium was not ready to have the flap reimplanted there was partial dehiscence and granulation tissue that needed to heal and granulate in so after extensive discussions with plastic surgery and myself intraoperatively we elected to abort the implantation of the bone flap to allow the incision to heal better with adequate wound care.  Patient will be discharged scheduled follow-up in 1 to 2 weeks with plastic surgery and myself.  Consults: Significant Diagnostic Studies: Treatments: I&D of cranial wound Discharge Exam: Blood pressure (!) 108/93, pulse 73, temperature 98 F (36.7 C), resp. rate 18, height 5\' 6"  (1.676 m), weight 70.8 kg, SpO2 97 %. Awake alert oriented strength 5 out of 5  Disposition: Home   Allergies as of 11/23/2021       Reactions   Keflex [cephalexin] Hives, Swelling   Morphine    Unknown reaction    Penicillins Hives, Swelling        Medication List     TAKE these medications    acetaminophen 325 MG tablet Commonly known as: TYLENOL Take 1-2 tablets (325-650 mg total) by mouth every 4 (four) hours as needed for mild pain.   divalproex 250 MG DR tablet Commonly known as: DEPAKOTE Take 1 tablet (250 mg total) by mouth every 12 (twelve) hours.   doxycycline 100 MG tablet Commonly known as: VIBRA-TABS Take 1 tablet (100 mg total) by mouth 2 (two) times  daily.   HYDROcodone-acetaminophen 5-325 MG tablet Commonly known as: NORCO/VICODIN Take 1 tablet by mouth every 4 (four) hours as needed for moderate pain.   hydrocortisone cream 1 % Apply topically 4 (four) times daily as needed for itching.   megestrol 400 MG/10ML suspension Commonly known as: MEGACE Take 10 mLs (400 mg total) by mouth daily.   Melatonin 10 MG Tabs Take 10 mg by mouth at bedtime.   methylphenidate 5 MG tablet Commonly known as: Ritalin Take 1 tablet (5 mg total) by mouth 2 (two) times daily with breakfast and lunch.   methylphenidate 5 MG tablet Commonly known as: RITALIN Take 1 tablet (5 mg total) by mouth 2 (two) times daily with breakfast and lunch.   multivitamin with minerals Tabs tablet Take 1 tablet by mouth daily.   OVER THE COUNTER MEDICATION Take 1 capsule by mouth 2 (two) times daily. Balance of nature vegetables   OVER THE COUNTER MEDICATION Take 1 capsule by mouth 2 (two) times daily. Balance of nature fruit   Refresh Tears 0.5 % Soln Generic drug: carboxymethylcellulose Place 1 drop into both eyes daily as needed (dry eyes).   topiramate 25 MG tablet Commonly known as: TOPAMAX Take 1 tablet (25 mg total) by mouth at bedtime.        Follow-up Information     , MD Follow up in 1 week(s).   Specialty: Plastic Surgery Contact information: [address] [place] [address] [place] [phone]  Donalee Citrin, MD Follow up in 2 week(s).   Specialty: Neurosurgery Contact information: [address] [place] [address] [phone]                 Signed: Mariam Dollar 11/23/2021, 9:36 AM

## 2021-11-23 NOTE — Anesthesia Procedure Notes (Signed)
Procedure Name: Intubation Date/Time: 11/23/2021 8:32 AM  Performed by: Dorann Lodge, CRNAPre-anesthesia Checklist: Patient identified, Emergency Drugs available, Suction available and Patient being monitored Patient Re-evaluated:Patient Re-evaluated prior to induction Oxygen Delivery Method: Circle System Utilized Preoxygenation: Pre-oxygenation with 100% oxygen Induction Type: IV induction Ventilation: Mask ventilation without difficulty Laryngoscope Size: Mac and 3 Grade View: Grade I Tube type: Oral Tube size: 7.0 mm Number of attempts: 1 Airway Equipment and Method: Stylet Placement Confirmation: ETT inserted through vocal cords under direct vision, positive ETCO2 and breath sounds checked- equal and bilateral Secured at: 21 cm Tube secured with: Tape Dental Injury: Teeth and Oropharynx as per pre-operative assessment

## 2021-11-23 NOTE — H&P (Signed)
Kayla Maynard is an 66 y.o. female.   Chief Complaint: Cranial defect HPI: 66 year old female with history of left P-comm aneurysm and subarachnoid hemorrhage and subdural hematoma underwent coiling of the aneurysm and evacuation of subdural hematoma.  Patient now presents for replacement of cranial flap.  She has had an eschar over the incision that intermittently has some drainage was treated with antibiotics appears to be adequately treated not infected however we have explained to her we will debride her incision remove the eschar provided the area underneath looks adequately treated noninfected we will proceed forward with replacement of cranial flap.  If not we will treat and continue extend antibiotic course and delay replacement the cranial flap until incision and scalp completely healed.  I have gone over the risks and benefits of this operation with her as well as perioperative course expectations of outcome and alternatives to surgery and she understands and agrees to proceed forward.  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, s/p coil embolization, left craniotomy for evacuation of SDH 08/27/21   Myocardial infarction (HCC)    04/30/02 due to 50% mid LAD with ruptured plaque, s/p PTCA/stent    Past Surgical History:  Procedure Laterality Date   ABDOMINAL HYSTERECTOMY     BACK SURGERY     Dr Jordan Likes   CORONARY ANGIOPLASTY  04/30/2002   CRANIOTOMY Left 08/27/2021   Procedure: CRANIOTOMY FOR EVACUATION OF SUBDURAL HEMATOMA WITH PLACEMENT OF BONE FLAP IN ABDOMEN;  Surgeon: Donalee Citrin, MD;  Location: [facility] OR;  Service: Neurosurgery;  Laterality: Left;   IR ANGIO INTRA EXTRACRAN SEL COM CAROTID INNOMINATE UNI R MOD SED  08/27/2021   IR ANGIO INTRA EXTRACRAN SEL INTERNAL CAROTID UNI L MOD SED  08/30/2021   IR ANGIO  VERTEBRAL SEL SUBCLAVIAN INNOMINATE UNI R MOD SED  08/30/2021   IR ANGIOGRAM FOLLOW UP STUDY  08/27/2021   IR ANGIOGRAM FOLLOW UP STUDY  08/27/2021   IR ANGIOGRAM FOLLOW UP STUDY  08/27/2021   IR ANGIOGRAM FOLLOW UP STUDY  08/27/2021   IR ANGIOGRAM FOLLOW UP STUDY  08/27/2021   IR ANGIOGRAM FOLLOW UP STUDY  08/27/2021   IR ANGIOGRAM FOLLOW UP STUDY  08/27/2021   IR CT HEAD LTD  08/27/2021   IR NEURO EACH ADD'L AFTER BASIC UNI LEFT (MS)  08/30/2021   IR RADIOLOGIST EVAL & MGMT  10/18/2021   IR TRANSCATH/EMBOLIZ  08/27/2021   RADIOLOGY WITH ANESTHESIA N/A 08/27/2021   Procedure: RADIOLOGY WITH ANESTHESIA;  Surgeon: Julieanne Cotton, MD;  Location: [facility] OR;  Service: Radiology;  Laterality: N/A;   STENT PLACEMENT VASCULAR ([facility] HX)      History reviewed. No pertinent family history. Social History:  reports that she quit smoking about 2 months ago. Her smoking use included cigarettes. She smoked an average of 1 pack per day. She has never used smokeless tobacco. She reports that she does not currently use alcohol. She reports that she does not use drugs.  Allergies:  Allergies  Allergen Reactions   Keflex [Cephalexin] Hives and Swelling   Morphine     Unknown reaction    Penicillins Hives and Swelling    Medications Prior to Admission  Medication Sig Dispense Refill   carboxymethylcellulose (REFRESH TEARS) 0.5 % SOLN Place 1 drop into both eyes daily as needed (  dry eyes).     doxycycline (VIBRA-TABS) 100 MG tablet Take 100 mg by mouth 2 (two) times daily.     hydrocortisone cream 1 % Apply topically 4 (four) times daily as needed for itching. 30 g 0   OVER THE COUNTER MEDICATION Take 1 capsule by mouth 2 (two) times daily. Balance of nature vegetables     OVER THE COUNTER MEDICATION Take 1 capsule by mouth 2 (two) times daily. Balance of nature fruit     topiramate (TOPAMAX) 25 MG tablet Take 1 tablet (25 mg total) by mouth at bedtime. 30 tablet 0   acetaminophen (TYLENOL) 325 MG  tablet Take 1-2 tablets (325-650 mg total) by mouth every 4 (four) hours as needed for mild pain. (Patient not taking: Reported on 11/14/2021)     divalproex (DEPAKOTE) 250 MG DR tablet Take 1 tablet (250 mg total) by mouth every 12 (twelve) hours. (Patient not taking: Reported on 11/14/2021) 60 tablet 2   megestrol (MEGACE) 400 MG/10ML suspension Take 10 mLs (400 mg total) by mouth daily. (Patient not taking: Reported on 11/14/2021) 480 mL 0   Melatonin 10 MG TABS Take 10 mg by mouth at bedtime. (Patient not taking: Reported on 11/14/2021) 30 tablet 0   methylphenidate (RITALIN) 5 MG tablet Take 1 tablet (5 mg total) by mouth 2 (two) times daily with breakfast and lunch. (Patient not taking: Reported on 11/14/2021) 34 tablet 0   methylphenidate (RITALIN) 5 MG tablet Take 1 tablet (5 mg total) by mouth 2 (two) times daily with breakfast and lunch. (Patient not taking: Reported on 11/14/2021) 60 tablet 0   Multiple Vitamin (MULTIVITAMIN WITH MINERALS) TABS tablet Take 1 tablet by mouth daily. (Patient not taking: Reported on 11/14/2021)      Results for orders placed or performed during the hospital encounter of 11/21/21 (from the past 48 hour(s))  Basic metabolic panel per protocol     Status: Abnormal   Collection Time: 11/21/21 11:22 AM  Result Value Ref Range   Sodium 141 135 - 145 mmol/L   Potassium 4.2 3.5 - 5.1 mmol/L   Chloride 113 (H) 98 - 111 mmol/L   CO2 21 (L) 22 - 32 mmol/L   Glucose, Bld 95 70 - 99 mg/dL    Comment: Glucose reference range applies only to samples taken after fasting for at least 8 hours.   BUN 13 8 - 23 mg/dL   Creatinine, Ser 7.82 0.44 - 1.00 mg/dL   Calcium 9.4 8.9 - 95.6 mg/dL   GFR, Estimated >21 >30 mL/min    Comment: (NOTE) Calculated using the CKD-EPI Creatinine Equation (2021)    Anion gap 7 5 - 15    Comment: Performed at [facility], [address] [place], [place] [address]  CBC per protocol     Status: None   Collection Time: 11/21/21 11:22 AM   Result Value Ref Range   WBC 9.6 4.0 - 10.5 K/uL   RBC 4.35 3.87 - 5.11 MIL/uL   Hemoglobin 13.0 12.0 - 15.0 g/dL   HCT 46.9 62.9 - 52.8 %   MCV 93.8 80.0 - 100.0 fL   MCH 29.9 26.0 - 34.0 pg   MCHC 31.9 30.0 - 36.0 g/dL   RDW 41.3 24.4 - 01.0 %   Platelets 282 150 - 400 K/uL   nRBC 0.0 0.0 - 0.2 %    Comment: Performed at [facility], [address], [place], [place] [address]   No results found.  Review of Systems  Blood pressure 124/78, pulse 92, resp. rate 18, height 5\' 6"  (1.676 m), weight 70.8 kg, SpO2 100 %. Physical Exam HENT:     Right Ear: Tympanic membrane normal.     Nose: Nose normal.     Mouth/Throat:     Mouth: Mucous membranes are moist.  Eyes:     Pupils: Pupils are equal, round, and reactive to light.  Cardiovascular:     Rate and Rhythm: Normal rate.     Pulses: Normal pulses.  Pulmonary:     Effort: Pulmonary effort is normal.  Abdominal:     General: Abdomen is flat.  Musculoskeletal:     Cervical back: Normal range of motion.  Skin:    General: Skin is warm.  Neurological:     Mental Status: She is alert.     Comments: Pupils equal extract movements are intact cranial nerves are intact left-sided skull defect with eschar over incision moves all extremities well 5 out of 5 strength      Assessment/Plan 66 year old presents for replacement of cranial flap  Elaina Hoops, MD 11/23/2021, 8:12 AM

## 2021-11-23 NOTE — Op Note (Signed)
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  Patient presents now for reimplantation of cranial flap.  However cranial incision did have extensive eschar over it and we recommended debridement of the eschar intraoperatively and depending on what we found i.e. whether there is infection or question of healing that we would just I&D the incision and not implant the flap however if the incision looks good we will proceed forward with the surgery as far as repeat implantation of cranial flap.  I extensively explained to the patient preoperatively as well as went over perioperative course expectations of outcome and alternatives of surgery and she understood and agreed to proceed forward.  Operative procedure: Patient was brought into the OR was [facility] general anesthesia and positioned supine shoulder bump under her left shoulder both the left side abdominal wall and the left cranial defect prepped out.  I then scrubbed the cranial incision and debrided the eschar and upon debriding the eschar noted there was a fairly sizable 4 to 5 cm opening of the skin with subcutaneous tissue and granulomatous material herniating through the incision both around the apex of the posterior frontal parietal component of the incision as well as anteriorly along the frontal incision.  I consulted and called Dr. Carlene Coria plastic surgery sent him pictures of the incision and we both agreed that this need to granulate in more and heal prior to opening up the incision and replacing the flap because of concerns of  not being able to get the incision closed and the integrity of the skin.  So at this point we went ahead and continued to prepped the incision with DuraPrep draped the patient and then I did debride some of the herniating granulous tissue with bipolar electrocautery and a Ray-Tec sponge.  Got to good hyperemic tissue and then we placed Mirapex dressings over the defects.  At the end the case all needle count sponge counts were correct and patient was sent to recovery in stable condition.  She will have follow-up with plastic surgery and myself in a week or 2.

## 2021-11-23 NOTE — Transfer of Care (Signed)
Immediate Anesthesia Transfer of Care Note  Patient: Kayla Maynard  Procedure(s) Performed: DEBRIDEMENT OF LEFT FRONTAL CRANIAL WOUND (Left: Head)  Patient Location: PACU  Anesthesia Type:General  Level of Consciousness: awake and alert   Airway & Oxygen Therapy: Patient Spontanous Breathing  Post-op Assessment: Report given to RN and Post -op Vital signs reviewed and stable  Post vital signs: Reviewed and stable  Last Vitals:  Vitals Value Taken Time  BP 108/93 11/23/21 0921  Temp    Pulse 73 11/23/21 0922  Resp 18 11/23/21 0922  SpO2 98 % 11/23/21 0922  Vitals shown include unvalidated device data.  Last Pain:  Vitals:   11/23/21 0652  TempSrc:   PainSc: 0-No pain         Complications: No notable events documented.

## 2021-11-24 ENCOUNTER — Encounter (HOSPITAL_COMMUNITY): Payer: Self-pay | Admitting: Neurosurgery

## 2021-11-30 ENCOUNTER — Ambulatory Visit (INDEPENDENT_AMBULATORY_CARE_PROVIDER_SITE_OTHER): Payer: Medicare Other | Admitting: Plastic Surgery

## 2021-11-30 ENCOUNTER — Encounter: Payer: Self-pay | Admitting: Plastic Surgery

## 2021-11-30 DIAGNOSIS — I609 Nontraumatic subarachnoid hemorrhage, unspecified: Secondary | ICD-10-CM

## 2021-11-30 DIAGNOSIS — T8131XA Disruption of external operation (surgical) wound, not elsewhere classified, initial encounter: Secondary | ICD-10-CM | POA: Diagnosis not present

## 2021-11-30 DIAGNOSIS — G935 Compression of brain: Secondary | ICD-10-CM

## 2021-11-30 NOTE — Progress Notes (Signed)
   Referring Provider No referring provider defined for this encounter.   CC:  Left scalp wound  Kayla Maynard is an 66 y.o. female.  HPI: Patient is a 66 year old who had a previous subarachnoid hemorrhage and had coiling followed by evacuation of subdural hematoma.  Her bone flap is implanted in her abdominal wall.  She has a small wound along her scalp incision.  I am comanaging this patient with Dr. Wynetta Emery.   Review of Systems General: No fever or chills.  Physical Exam    11/23/2021    9:45 AM 11/23/2021    9:30 AM 11/23/2021    9:21 AM  Vitals with BMI  Systolic 109 116 389  Diastolic 69 72 93  Pulse 71 80 73    General:  No acute distress,  Alert and oriented, Non-Toxic, Normal speech and affect HEENT: Small scalp wound along the posterior edge of her incision on the left side of her head.  Concavity at the site of her hematoma evacuation.  Assessment/Plan I discussed with the patient that we would like to have reliable healing of this wound along the area where her bone flap will be replaced.  We discussed that she will undergo local tissue arrangement of the scalp and possible skin graft or skin graft substitute from the area where remove the scalp.  Basically explained that we will be moving the wound to a different place on her scalp to ensure reliable healing at the site where the bone flap will be replaced.  We will plan a co-surgeon with Dr. Wynetta Emery.  She is aware that she will likely have more than 1 stage for additional surgeries.  Janne Napoleon 11/30/2021, 9:10 AM

## 2021-12-16 ENCOUNTER — Telehealth: Payer: Self-pay

## 2021-12-16 NOTE — Telephone Encounter (Signed)
No prior auth required for scalp flap/skin graft per Medicare.

## 2022-01-11 ENCOUNTER — Telehealth: Payer: Self-pay | Admitting: *Deleted

## 2022-01-11 NOTE — Telephone Encounter (Signed)
Spoke to [place] @ Dr. Dola Argyle office. She is confirming surgery schedule with him. Pending 02/24/22 - hold placed on Dr. Domenica Reamer calendar

## 2022-01-23 ENCOUNTER — Telehealth: Payer: Self-pay | Admitting: *Deleted

## 2022-01-23 NOTE — Telephone Encounter (Signed)
LVM to share Pre/Post op appointment dates/times

## 2022-01-25 ENCOUNTER — Telehealth: Payer: Self-pay | Admitting: *Deleted

## 2022-01-25 NOTE — Telephone Encounter (Signed)
VM received 9/26 from patient stating she wanted to cancel her surgery because she doesn't want plastic in her head. LVM for Dr. Windy Carina office and with patient for clarification.

## 2022-02-20 ENCOUNTER — Encounter: Payer: Medicare Other | Admitting: Student

## 2022-02-27 ENCOUNTER — Inpatient Hospital Stay: Admit: 2022-02-27 | Payer: Medicare Other | Admitting: Neurosurgery

## 2022-02-27 SURGERY — CRANIOTOMY BONE FLAP/PROSTHETIC PLATE
Anesthesia: General | Laterality: Left

## 2022-03-06 ENCOUNTER — Encounter: Payer: Medicare Other | Admitting: Plastic Surgery

## 2022-03-30 ENCOUNTER — Other Ambulatory Visit: Payer: Self-pay | Admitting: Neurosurgery

## 2022-04-18 NOTE — Progress Notes (Signed)
Surgical Instructions    Your procedure is scheduled on Friday, December 29th, 2023.   Report to [facility] Main Entrance "A" at 05:30 A.M., then check in with the Admitting office.  Call this number if you have problems the morning of surgery:  [phone]   If you have any questions prior to your surgery date call [phone]: Open Monday-Friday 8am-4pm If you experience any cold or flu symptoms such as cough, fever, chills, shortness of breath, etc. between now and your scheduled surgery, please notify us at the above number     Remember:  Do not eat or drink after midnight the night before your surgery    Take these medicines the morning of surgery with A SIP OF WATER: NONE   As of today, STOP taking any Aspirin (unless otherwise instructed by your surgeon) Aleve, Naproxen, Ibuprofen, Motrin, Advil, Goody's, BC's, all herbal medications, fish oil, and all vitamins.   The day of surgery:          Do not wear jewelry or makeup. Do not wear lotions, powders, perfumes or deodorant. Do not shave 48 hours prior to surgery.   Do not bring valuables to the hospital. Do not wear nail polish, gel polish, artificial nails, or any other type of covering on natural nails (fingers and toes) If you have artificial nails or gel coating that need to be removed by a nail salon, please have this removed prior to surgery. Artificial nails or gel coating may interfere with anesthesia's ability to adequately monitor your vital signs.  [facility] is not responsible for any belongings or valuables.    Do NOT Smoke (Tobacco/Vaping)  24 hours prior to your procedure  If you use a CPAP at night, you may bring your mask for your overnight stay.   Contacts, glasses, hearing aids, dentures or partials may not be worn into surgery, please bring cases for these belongings   For patients admitted to the hospital, discharge time will be determined by your treatment team.   Patients discharged the day of  surgery will not be allowed to drive home, and someone needs to stay with them for 24 hours.   SURGICAL WAITING ROOM VISITATION Patients having surgery or a procedure may have no more than 2 support people in the waiting area - these visitors may rotate.   Children under the age of 85 must have an adult with them who is not the patient. If the patient needs to stay at the hospital during part of their recovery, the visitor guidelines for inpatient rooms apply. Pre-op nurse will coordinate an appropriate time for 1 support person to accompany patient in pre-op.  This support person may not rotate.   Please refer to [email or web address] for the visitor guidelines for Inpatients (after your surgery is over and you are in a regular room).    Special instructions:    Oral Hygiene is also important to reduce your risk of infection.  Remember - BRUSH YOUR TEETH THE MORNING OF SURGERY WITH YOUR REGULAR TOOTHPASTE   [facility]- Preparing For Surgery  Before surgery, you can play an important role. Because skin is not sterile, your skin needs to be as free of germs as possible. You can reduce the number of germs on your skin by washing with CHG (chlorahexidine gluconate) Soap before surgery.  CHG is an antiseptic cleaner which kills germs and bonds with the skin to continue killing germs even after washing.     Please do not use  if you have an allergy to CHG or antibacterial soaps. If your skin becomes reddened/irritated stop using the CHG.  Do not shave (including legs and underarms) for at least 48 hours prior to first CHG shower. It is OK to shave your face.  Please follow these instructions carefully.     Shower the NIGHT BEFORE SURGERY and the MORNING OF SURGERY with CHG Soap.   If you chose to wash your hair, wash your hair first as usual with your normal shampoo. After you shampoo, rinse your hair and body thoroughly to remove the shampoo.   Then [employer] and genitals (private parts) with your normal soap and rinse thoroughly to remove soap.  After that Use CHG Soap as you would any other liquid soap. You can apply CHG directly to the skin and wash gently with a scrungie or a clean washcloth.   Apply the CHG Soap to your body ONLY FROM THE NECK DOWN.  Do not use on open wounds or open sores. Avoid contact with your eyes, ears, mouth and genitals (private parts). Wash Face and genitals (private parts)  with your normal soap.   Wash thoroughly, paying special attention to the area where your surgery will be performed.  Thoroughly rinse your body with warm water from the neck down.  DO NOT shower/wash with your normal soap after using and rinsing off the CHG Soap.  Pat yourself dry with a CLEAN TOWEL.  Wear CLEAN PAJAMAS to bed the night before surgery  Place CLEAN SHEETS on your bed the night before your surgery  DO NOT SLEEP WITH PETS.   Day of Surgery:  Take a shower with CHG soap. Wear Clean/Comfortable clothing the morning of surgery Do not apply any deodorants/lotions.   Remember to brush your teeth WITH YOUR REGULAR TOOTHPASTE.    If you received a COVID test during your pre-op visit, it is requested that you wear a mask when out in public, stay away from anyone that may not be feeling well, and notify your surgeon if you develop symptoms. If you have been in contact with anyone that has tested positive in the last 10 days, please notify your surgeon.    Please read over the following fact sheets that you were given.

## 2022-04-19 ENCOUNTER — Other Ambulatory Visit: Payer: Self-pay

## 2022-04-19 ENCOUNTER — Encounter (HOSPITAL_COMMUNITY)
Admission: RE | Admit: 2022-04-19 | Discharge: 2022-04-19 | Disposition: A | Discharge status: Home / Self Care | Payer: Medicare Other | Source: Ambulatory Visit | Attending: Neurosurgery | Admitting: Neurosurgery

## 2022-04-19 ENCOUNTER — Encounter (HOSPITAL_COMMUNITY): Payer: Self-pay

## 2022-04-19 VITALS — BP 128/87 | HR 45 | Temp 98.3°F | Resp 18 | Ht 66.0 in | Wt 159.4 lb

## 2022-04-19 DIAGNOSIS — Q759 Congenital malformation of skull and face bones, unspecified: Secondary | ICD-10-CM | POA: Diagnosis not present

## 2022-04-19 DIAGNOSIS — I2510 Atherosclerotic heart disease of native coronary artery without angina pectoris: Secondary | ICD-10-CM | POA: Insufficient documentation

## 2022-04-19 DIAGNOSIS — Z87891 Personal history of nicotine dependence: Secondary | ICD-10-CM | POA: Insufficient documentation

## 2022-04-19 DIAGNOSIS — Z01818 Encounter for other preprocedural examination: Secondary | ICD-10-CM

## 2022-04-19 DIAGNOSIS — I252 Old myocardial infarction: Secondary | ICD-10-CM | POA: Insufficient documentation

## 2022-04-19 DIAGNOSIS — Z01812 Encounter for preprocedural laboratory examination: Secondary | ICD-10-CM | POA: Insufficient documentation

## 2022-04-19 DIAGNOSIS — Z955 Presence of coronary angioplasty implant and graft: Secondary | ICD-10-CM | POA: Diagnosis not present

## 2022-04-19 LAB — BASIC METABOLIC PANEL
Anion gap: 7 (ref 5–15)
BUN: 11 mg/dL (ref 8–23)
CO2: 25 mmol/L (ref 22–32)
Calcium: 9.3 mg/dL (ref 8.9–10.3)
Chloride: 108 mmol/L (ref 98–111)
Creatinine, Ser: 0.96 mg/dL (ref 0.44–1.00)
GFR, Estimated: >60 mL/min (ref >60)
Glucose, Bld: 88 mg/dL (ref 70–99)
Potassium: 4.4 mmol/L (ref 3.5–5.1)
Sodium: 140 mmol/L (ref 135–145)

## 2022-04-19 LAB — CBC
HCT: 47.4 % — ABNORMAL HIGH (ref 36.0–46.0)
Hemoglobin: 15.3 g/dL — ABNORMAL HIGH (ref 12.0–15.0)
MCH: 29.1 pg (ref 26.0–34.0)
MCHC: 32.3 g/dL (ref 30.0–36.0)
MCV: 90.3 fL (ref 80.0–100.0)
Platelets: 300 10*3/uL (ref 150–400)
RBC: 5.25 MIL/uL — ABNORMAL HIGH (ref 3.87–5.11)
RDW: 13 % (ref 11.5–15.5)
WBC: 9.6 10*3/uL (ref 4.0–10.5)
nRBC: 0 % (ref 0.0–0.2)

## 2022-04-19 NOTE — Progress Notes (Addendum)
PCP - Colvin Caroli FNP  Cardiologist - Denies  PPM/ICD - Denies Device Orders -  Rep Notified -   Chest x-ray -na  EKG - 08/27/21 Stress Test - none ECHO - 08/28/21 Cardiac Cath - 04/30/02  Sleep Study - denies CPAP -   Fasting Blood Sugar - na Checks Blood Sugar _____ times a day  Last dose of GLP1 agonist-  na GLP1 instructions:   Blood Thinner Instructions:na Aspirin Instructions:na  ERAS Protcol -no PRE-SURGERY Ensure or G2-   COVID TEST- na   Anesthesia review:yes-FYI in chart for difficult airway. Notified Antionette Poles PA via secure chat of pt's HR of 45. Pt asymptomatic. BP WNL. Chart forwarded for review.   Patient denies shortness of breath, fever, cough and chest pain at PAT appointment   All instructions explained to the patient, with a verbal understanding of the material. Patient agrees to go over the instructions while at home for a better understanding. Patient also instructed to wear a mask when out in public prior to surgery. The opportunity to ask questions was provided.

## 2022-04-19 NOTE — Progress Notes (Signed)
error 

## 2022-04-19 NOTE — Anesthesia Preprocedure Evaluation (Addendum)
Anesthesia Evaluation  Patient identified by MRN, date of birth, ID band Patient awake    Reviewed: Allergy & Precautions, NPO status , Patient's Chart, lab work & pertinent test results  Airway Mallampati: II  TM Distance: >3 FB Neck ROM: Full    Dental no notable dental hx. (+) Teeth Intact, Dental Advisory Given   Pulmonary former smoker   Pulmonary exam normal breath sounds clear to auscultation       Cardiovascular + CAD and + Past MI (2003)  Normal cardiovascular exam Rhythm:Regular Rate:Normal  Echo 08/28/21: IMPRESSIONS  1. Left ventricular ejection fraction, by estimation, is >75%. The left  ventricle has hyperdynamic function. The left ventricle has no regional  wall motion abnormalities. There is moderate asymmetric left ventricular  hypertrophy of the infero-lateral  segment. Left ventricular diastolic parameters were normal. The average  left ventricular global longitudinal strain is -15.4 %. The global  longitudinal strain is normal.  2. Right ventricular systolic function is normal. The right ventricular  size is normal. Tricuspid regurgitation signal is inadequate for assessing  PA pressure.  3. The mitral valve is normal in structure. No evidence of mitral valve  regurgitation. No evidence of mitral stenosis.  4. The aortic valve is normal in structure. Aortic valve regurgitation is  not visualized. No aortic stenosis is present.  5. The inferior vena cava is dilated in size with >50% respiratory  variability, suggesting right atrial pressure of 8 mmHg.       Neuro/Psych    GI/Hepatic   Endo/Other    Renal/GU Lab Results      Component                Value               Date                      CREATININE               0.96                04/19/2022                BUN                      11                  04/19/2022                NA                       140                 04/19/2022                 K                        4.4                 04/19/2022                CL                       108                 04/19/2022  CO2                      25                  04/19/2022                Musculoskeletal   Abdominal   Peds  Hematology Lab Results      Component                Value               Date                      WBC                      9.6                 04/19/2022                HGB                      15.3 (H)            04/19/2022                HCT                      47.4 (H)            04/19/2022                MCV                      90.3                04/19/2022                PLT                      300                 04/19/2022              Anesthesia Other Findings ALL: Keflex, Mso4, PCN  Reproductive/Obstetrics                             Anesthesia Physical Anesthesia Plan  ASA: 3  Anesthesia Plan: General   Post-op Pain Management:    Induction: Intravenous  PONV Risk Score and Plan: Treatment may vary due to age or medical condition and Ondansetron  Airway Management Planned: Video Laryngoscope Planned and Oral ETT  Additional Equipment: None  Intra-op Plan:   Post-operative Plan: Extubation in OR  Informed Consent: I have reviewed the patients History and Physical, chart, labs and discussed the procedure including the risks, benefits and alternatives for the proposed anesthesia with the patient or authorized representative who has indicated his/her understanding and acceptance.     Dental advisory given  Plan Discussed with: CRNA and Anesthesiologist  Anesthesia Plan Comments: (PAT note written 04/19/2022 by Shonna Chock, PA-C.  )       Anesthesia Quick Evaluation

## 2022-04-19 NOTE — Progress Notes (Signed)
Anesthesia Chart Review:  Case: [id number] Date/Time: 04/28/22 0715   Procedure: Reimplantation of cranial flap   Anesthesia type: General   Pre-op diagnosis: Skull defect   Location: [place] OR ROOM 20 / [place] OR   Surgeons: Kary Kos, MD       DISCUSSION: Patient is a 66 year old female scheduled for the above procedure. 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 She developed partial dehiscence of the scalp flap and underwent I&D of cranial incision 11/23/21. She is now scheduled for reimplantation of cranial flap.   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   [facility] admission 08/27/21-09/13/21 for ICH.  Patient had been feeling ill with headache and episode of vomiting. She notified her sister who went to check on her and found her unresponsive.  ED notes says family called EMS and started CPR. She had seizure like activity with + pulse per EMS. CBG read as "zero" and given D10 with CBG up to 212. At [facility] ED she had more seizure like activity, became lethargic and disoriented requiring intubation to protect airway. (Per 08/27/21 ED note by Milton Ferguson, MD, "Difficult Airway- due to immobile epiglottis" but successfully intubated with 7.5 mm ETT using Glidescope, one attempt.) Head CT showed ICH secondary to extensive SAH and large left SDH. She was also hypotension and bradycardic and  started on clevidipine. Neurology and neurosurgery consulted, and patient transferred from [facility] to [facility] for further management. 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 Patient underwent coil embolization followed by left craniectomy for evacuation of subdural hematoma. She made "excellent neurologic recovery" and discharged to [facility] for rehab 09/13/21-09/28/21.  - On 11/23/21, oral intubation, 7.0 ETT, MAC and 3, 1 attempt, mask ventilation without difficulty.    In July, prior to last cranial surgery, she had given a vague description of a prior cardiac intervention. I was able to obtain [facility] Discharge Summaries 04/29/02 and 04/30/02 that indicated she had tachy palpitations with chest pain on 04/29/02, and then on 04/30/02 had significantly elevated BP during physical therapy and referred to PCP who sent her to [facility]. She was transferred to [facility] after EKG showed some elevation in anterolateral leads, troponin 1.74. Cardiac cath showed 50% mid LAD with ruptured plaque and apical akinesis. LAD ruptured plaque was felt to be the source of her MI. She underwent PTCA/stent mid LAD. Other vessels showed 30% LCX and only mild RCA disease, EF > 65%. Follow-up LHC on 05/05/02 for chest pain showed patent stent. She is not currently followed by cardiology. Echo on 08/28/21 showed EF > 75%, no regional wall motion abnormalities, moderate asymmetric LVH of the infero-lateral segment. Normal RVSF. She denied chest pain and SOB at PAT RN interview. She is not on any routine medications.   She has tolerated 2 cranial surgeries earlier this year and now needs her cranial flap reimplanted. Anesthesia team to evaluate on the day of surgery.  She was not noted to be a difficult intubation on 11/22/21.   VS: BP 128/87   Pulse (!) 45   Temp 36.8 C (Oral)   Resp 18   Ht _0  (1.676 m)   Wt 72.3 kg   SpO2 100%   BMI 25.73 kg/m  She denied symptoms of bradycardia per  PAT RN interview. EKG on 08/27/21 showed SB at 48 bpm.    PROVIDERS: Rubin Payor, FNP-C with [facility] in [place] is PCP     Luanne Bras, MD is IR. Last evaluation 10/18/21.  Advised to continue present medications, maintain adequate hydration, refrain from driving, and plan for MRI/MRA of the brain 4 months from the time of endovascular treatment of her aneurysm, and ongoing neurosurgery follow-up.    LABS: Labs reviewed: Acceptable for surgery. (all labs ordered are listed, but only abnormal results are displayed)  Labs Reviewed  CBC - Abnormal; Notable for the following components:      Result Value   RBC 5.25 (*)    Hemoglobin 15.3 (*)    HCT 47.4 (*)    All other components within normal limits  BASIC METABOLIC PANEL     IMAGES: 1V PCXR 09/06/21: FINDINGS: Stable heart size. Feeding tube extends below the diaphragm. Lungs demonstrate continued improved aeration with minimal bibasilar atelectasis remaining. No convincing pneumonia. No pulmonary edema, pneumothorax or pleural fluid identified. IMPRESSION: Continued improved aeration of both lungs with minimal bibasilar atelectasis remaining. No convincing pneumonia identified.     EKG: EKG 08/27/21 14:47:56: Sinus or ectopic atrial bradycardia at 48 bpm Confirmed by Ronnald Nian, Adam ([id number]) on 08/29/2021 10:59:00 AM   EKG 08/27/21 14:23:22: Sinus rhythm Probable left atrial enlargement Confirmed by Ronnald Nian, Adam ([id number]) on 08/29/2021 10:58:57 AM     CV: [place] Transcranial Doppler 09/09/21: Summary:  Poor suboccipital and right temporal windows limits study. Low normal mean  flow velocities in majority of identified vessels of anterior and  posterior cerebral circulation.Elevated pulsatility indices suggest diffus  eincrease in intracranial pressure or  diffuse intracranial atherosclerosis.      Echo 08/28/21: IMPRESSIONS   1. Left ventricular ejection fraction, by estimation, is  >75%. The left  ventricle has hyperdynamic function. The left ventricle has no regional  wall motion abnormalities. There is moderate asymmetric left ventricular  hypertrophy of the infero-lateral  segment. Left ventricular diastolic parameters were normal. The average  left ventricular global longitudinal strain is -15.4 %. The global  longitudinal strain is normal.   2. Right ventricular systolic function is normal. The right ventricular  size is normal. Tricuspid regurgitation signal is inadequate for assessing  PA pressure.   3. The mitral valve is normal in structure. No evidence of mitral valve  regurgitation. No evidence of mitral stenosis.   4. The aortic valve is normal in structure. Aortic valve regurgitation is  not visualized. No aortic stenosis is present.   5. The inferior vena cava is dilated in size with >50% respiratory  variability, suggesting right atrial pressure of 8 mmHg.      Per Discharge Summaries obtained from [facility]: - Cardiac cath 05/05/02 (for recurrent chest pain): "Dr. Wilhemina Cash noted that her left main was normal, her circumflex was normal, the stent site at the mid LAD was mildly patent.  She had dominant normal RCA.  It was felt that her chest discomfort was not cardiac in etiology."   - Cardiac cath 04/30/02 (Had tachy palpitations with chest pain 04/29/02, 04/30/02 had  significant elevated BP during physical therapy and referred to PCP who sent her to [facility] and was transferred to [facility] after EKG showed some elevation in anterolateral leads, troponin 1.74):  "The cardiac catheterization showed a normal left main and an LAD with a 50% stenosis in the midportion and a ruptured plaque.  It was felt that the ruptured plaque was responsible for the MI.  There was a 30% proximal stenosis in the circumflex, but only mild disease in the RCA system and no other significant disease in the circumflex system.  Her EF was greater than 65% with some apical  akinesis.  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, s/p coil embolization, left craniotomy for evacuation of SDH 08/27/21   Myocardial infarction ([place])    04/30/02 due to 50% mid LAD with ruptured plaque, s/p PTCA/stent    Past Surgical History:  Procedure Laterality Date   ABDOMINAL HYSTERECTOMY     BACK SURGERY     Dr Annette Stable   CORONARY ANGIOPLASTY  04/30/2002   CRANIOTOMY Left 08/27/2021   Procedure: CRANIOTOMY FOR EVACUATION OF SUBDURAL HEMATOMA WITH PLACEMENT OF BONE FLAP IN ABDOMEN;  Surgeon: Kary Kos, MD;  Location: [place];  Service: Neurosurgery;  Laterality: Left;   CRANIOTOMY Left 11/23/2021   Procedure: DEBRIDEMENT OF LEFT FRONTAL CRANIAL WOUND;  Surgeon: Kary Kos, MD;  Location: [place];  Service: Neurosurgery;  Laterality: Left;   IR ANGIO INTRA EXTRACRAN SEL COM CAROTID INNOMINATE UNI R MOD SED  08/27/2021   IR ANGIO INTRA EXTRACRAN SEL INTERNAL CAROTID UNI L MOD SED  08/30/2021   IR ANGIO VERTEBRAL SEL SUBCLAVIAN INNOMINATE UNI R MOD SED  08/30/2021   IR ANGIOGRAM FOLLOW UP STUDY  08/27/2021   IR ANGIOGRAM FOLLOW UP STUDY  08/27/2021   IR ANGIOGRAM FOLLOW UP STUDY  08/27/2021   IR ANGIOGRAM FOLLOW UP STUDY  08/27/2021   IR ANGIOGRAM FOLLOW UP STUDY  08/27/2021   IR ANGIOGRAM FOLLOW UP STUDY  08/27/2021   IR ANGIOGRAM FOLLOW UP STUDY  08/27/2021   IR CT HEAD LTD  08/27/2021   IR NEURO EACH ADD'L AFTER BASIC UNI LEFT (MS)  08/30/2021   IR RADIOLOGIST EVAL & MGMT  10/18/2021   IR TRANSCATH/EMBOLIZ  08/27/2021   RADIOLOGY WITH ANESTHESIA N/A 08/27/2021   Procedure: RADIOLOGY WITH ANESTHESIA;  Surgeon: Luanne Bras, MD;  Location: [place];  Service: Radiology;   Laterality: N/A;   STENT PLACEMENT VASCULAR ([place] HX)      MEDICATIONS:  acetaminophen (TYLENOL) 325 MG tablet   divalproex (DEPAKOTE) 250 MG DR tablet   doxycycline (VIBRA-TABS) 100 MG tablet   HYDROcodone-acetaminophen (NORCO/VICODIN) 5-325 MG tablet   hydrocortisone cream 1 %   megestrol (MEGACE) 400 MG/10ML suspension   Melatonin 10 MG TABS   methylphenidate (RITALIN) 5 MG tablet   methylphenidate (RITALIN) 5 MG tablet   Multiple Vitamin (MULTIVITAMIN WITH MINERALS) TABS tablet   topiramate (TOPAMAX) 25 MG tablet   No current facility-administered medications for this encounter.  Medications are currently listed as not taking.   Myra Gianotti, PA-C Surgical Short Stay/Anesthesiology [facility] Phone [phone] [facility] Phone [phone] 04/19/2022 1:18 PM

## 2022-04-28 ENCOUNTER — Encounter (HOSPITAL_COMMUNITY)
Admission: RE | Disposition: A | Discharge status: Home / Self Care | Payer: Self-pay | Source: Home / Self Care | Attending: Neurosurgery

## 2022-04-28 ENCOUNTER — Other Ambulatory Visit: Payer: Self-pay

## 2022-04-28 ENCOUNTER — Inpatient Hospital Stay (HOSPITAL_COMMUNITY)
Admission: RE | Admit: 2022-04-28 | Discharge: 2022-04-29 | DRG: 517 | Disposition: A | Discharge status: Home / Self Care | Payer: Medicare Other | Attending: Neurosurgery | Admitting: Neurosurgery

## 2022-04-28 ENCOUNTER — Inpatient Hospital Stay (HOSPITAL_COMMUNITY): Payer: Medicare Other | Admitting: Vascular Surgery

## 2022-04-28 ENCOUNTER — Encounter (HOSPITAL_COMMUNITY): Payer: Self-pay | Admitting: Neurosurgery

## 2022-04-28 DIAGNOSIS — M952 Other acquired deformity of head: Principal | ICD-10-CM | POA: Diagnosis present

## 2022-04-28 DIAGNOSIS — Z955 Presence of coronary angioplasty implant and graft: Secondary | ICD-10-CM

## 2022-04-28 DIAGNOSIS — Z885 Allergy status to narcotic agent status: Secondary | ICD-10-CM | POA: Diagnosis not present

## 2022-04-28 DIAGNOSIS — Z8673 Personal history of transient ischemic attack (TIA), and cerebral infarction without residual deficits: Secondary | ICD-10-CM

## 2022-04-28 DIAGNOSIS — I2510 Atherosclerotic heart disease of native coronary artery without angina pectoris: Secondary | ICD-10-CM

## 2022-04-28 DIAGNOSIS — Z79899 Other long term (current) drug therapy: Secondary | ICD-10-CM | POA: Diagnosis not present

## 2022-04-28 DIAGNOSIS — I252 Old myocardial infarction: Secondary | ICD-10-CM

## 2022-04-28 DIAGNOSIS — Z881 Allergy status to other antibiotic agents status: Secondary | ICD-10-CM

## 2022-04-28 DIAGNOSIS — Q759 Congenital malformation of skull and face bones, unspecified: Principal | ICD-10-CM

## 2022-04-28 DIAGNOSIS — Z87891 Personal history of nicotine dependence: Secondary | ICD-10-CM

## 2022-04-28 DIAGNOSIS — Z880 Allergy status to penicillin: Secondary | ICD-10-CM

## 2022-04-28 HISTORY — PX: CRANIOTOMY: SHX93

## 2022-04-28 LAB — BASIC METABOLIC PANEL
Anion gap: 7 (ref 5–15)
BUN: 13 mg/dL (ref 8–23)
CO2: 24 mmol/L (ref 22–32)
Calcium: 8.8 mg/dL — ABNORMAL LOW (ref 8.9–10.3)
Chloride: 104 mmol/L (ref 98–111)
Creatinine, Ser: 1.07 mg/dL — ABNORMAL HIGH (ref 0.44–1.00)
GFR, Estimated: 58 mL/min — ABNORMAL LOW (ref >60)
Glucose, Bld: 209 mg/dL — ABNORMAL HIGH (ref 70–99)
Potassium: 4.1 mmol/L (ref 3.5–5.1)
Sodium: 135 mmol/L (ref 135–145)

## 2022-04-28 LAB — CBC
HCT: 40.8 % (ref 36.0–46.0)
Hemoglobin: 13.3 g/dL (ref 12.0–15.0)
MCH: 29.3 pg (ref 26.0–34.0)
MCHC: 32.6 g/dL (ref 30.0–36.0)
MCV: 89.9 fL (ref 80.0–100.0)
Platelets: 246 10*3/uL (ref 150–400)
RBC: 4.54 MIL/uL (ref 3.87–5.11)
RDW: 13.1 % (ref 11.5–15.5)
WBC: 9.9 10*3/uL (ref 4.0–10.5)
nRBC: 0 % (ref 0.0–0.2)

## 2022-04-28 SURGERY — CRANIOTOMY BONE FLAP/PROSTHETIC PLATE
Anesthesia: General

## 2022-04-28 MED ORDER — DEXAMETHASONE SODIUM PHOSPHATE 10 MG/ML IJ SOLN
INTRAMUSCULAR | Status: DC | PRN
  Administered 2022-04-28: 5 mg via INTRAVENOUS

## 2022-04-28 MED ORDER — ROCURONIUM BROMIDE 10 MG/ML (PF) SYRINGE
PREFILLED_SYRINGE | INTRAVENOUS | Status: AC
  Filled 2022-04-28: qty 10

## 2022-04-28 MED ORDER — LIDOCAINE-EPINEPHRINE 1 %-1:100000 IJ SOLN
INTRAMUSCULAR | Status: DC | PRN
  Administered 2022-04-28: 10 mL

## 2022-04-28 MED ORDER — DEXAMETHASONE SODIUM PHOSPHATE 10 MG/ML IJ SOLN
INTRAMUSCULAR | Status: AC
  Filled 2022-04-28: qty 1

## 2022-04-28 MED ORDER — PROMETHAZINE HCL 25 MG PO TABS: 12.5000 mg | ORAL_TABLET | ORAL | Status: DC | PRN

## 2022-04-28 MED ORDER — CHLORHEXIDINE GLUCONATE 0.12 % MT SOLN: 15.0000 mL | Freq: Once | OROMUCOSAL | Status: AC

## 2022-04-28 MED ORDER — CHLORHEXIDINE GLUCONATE CLOTH 2 % EX PADS: 6.0000 | MEDICATED_PAD | Freq: Once | CUTANEOUS | Status: DC

## 2022-04-28 MED ORDER — BACITRACIN ZINC 500 UNIT/GM EX OINT
TOPICAL_OINTMENT | CUTANEOUS | Status: AC
  Filled 2022-04-28: qty 28.35

## 2022-04-28 MED ORDER — SODIUM CHLORIDE 0.9 % IV SOLN: INTRAVENOUS | Status: DC | PRN

## 2022-04-28 MED ORDER — PHENYLEPHRINE 80 MCG/ML (10ML) SYRINGE FOR IV PUSH (FOR BLOOD PRESSURE SUPPORT)
PREFILLED_SYRINGE | INTRAVENOUS | Status: DC | PRN
  Administered 2022-04-28: 160 ug via INTRAVENOUS
  Administered 2022-04-28: 320 ug via INTRAVENOUS
  Administered 2022-04-28 (×2): 160 ug via INTRAVENOUS

## 2022-04-28 MED ORDER — DOCUSATE SODIUM 100 MG PO CAPS: 100.0000 mg | ORAL_CAPSULE | Freq: Two times a day (BID) | ORAL | Status: DC

## 2022-04-28 MED ORDER — ONDANSETRON HCL 4 MG PO TABS: 4.0000 mg | ORAL_TABLET | ORAL | Status: DC | PRN

## 2022-04-28 MED ORDER — ACETAMINOPHEN 325 MG PO TABS: 325.0000 mg | ORAL_TABLET | ORAL | Status: DC | PRN

## 2022-04-28 MED ORDER — MELATONIN 5 MG PO TABS: 10.0000 mg | ORAL_TABLET | Freq: Every day | ORAL | Status: DC

## 2022-04-28 MED ORDER — OXYCODONE HCL 5 MG/5ML PO SOLN: 5.0000 mg | Freq: Once | ORAL | Status: DC | PRN

## 2022-04-28 MED ORDER — CHLORHEXIDINE GLUCONATE 0.12 % MT SOLN
OROMUCOSAL | Status: AC
  Administered 2022-04-28: 15 mL via OROMUCOSAL
  Filled 2022-04-28: qty 15

## 2022-04-28 MED ORDER — ONDANSETRON HCL 4 MG/2ML IJ SOLN: 4.0000 mg | Freq: Once | INTRAMUSCULAR | Status: DC | PRN

## 2022-04-28 MED ORDER — THROMBIN 20000 UNITS EX SOLR
CUTANEOUS | Status: AC
  Filled 2022-04-28: qty 20000

## 2022-04-28 MED ORDER — 0.9 % SODIUM CHLORIDE (POUR BTL) OPTIME
TOPICAL | Status: DC | PRN
  Administered 2022-04-28: 2000 mL

## 2022-04-28 MED ORDER — ONDANSETRON HCL 4 MG/2ML IJ SOLN: 4.0000 mg | INTRAMUSCULAR | Status: DC | PRN

## 2022-04-28 MED ORDER — FENTANYL CITRATE (PF) 250 MCG/5ML IJ SOLN
INTRAMUSCULAR | Status: AC
  Filled 2022-04-28: qty 5

## 2022-04-28 MED ORDER — DOXYCYCLINE HYCLATE 100 MG PO TABS
100.0000 mg | ORAL_TABLET | Freq: Two times a day (BID) | ORAL | Status: DC
  Filled 2022-04-28: qty 1

## 2022-04-28 MED ORDER — VANCOMYCIN HCL IN DEXTROSE 1-5 GM/200ML-% IV SOLN: 1000.0000 mg | INTRAVENOUS | Status: AC

## 2022-04-28 MED ORDER — VANCOMYCIN HCL IN DEXTROSE 1-5 GM/200ML-% IV SOLN
INTRAVENOUS | Status: AC
  Administered 2022-04-28: 1000 mg via INTRAVENOUS
  Filled 2022-04-28: qty 200

## 2022-04-28 MED ORDER — PHENYLEPHRINE HCL-NACL 20-0.9 MG/250ML-% IV SOLN
INTRAVENOUS | Status: DC | PRN
  Administered 2022-04-28: 20 ug/min via INTRAVENOUS

## 2022-04-28 MED ORDER — TOPIRAMATE 25 MG PO TABS
25.0000 mg | ORAL_TABLET | Freq: Every day | ORAL | Status: DC
  Administered 2022-04-28: 25 mg via ORAL
  Filled 2022-04-28: qty 1

## 2022-04-28 MED ORDER — POTASSIUM CHLORIDE IN NACL 20-0.9 MEQ/L-% IV SOLN
INTRAVENOUS | Status: DC
  Filled 2022-04-28: qty 1000

## 2022-04-28 MED ORDER — CHLORHEXIDINE GLUCONATE CLOTH 2 % EX PADS: 6.0000 | MEDICATED_PAD | Freq: Every day | CUTANEOUS | Status: DC

## 2022-04-28 MED ORDER — THROMBIN 5000 UNITS EX SOLR
CUTANEOUS | Status: AC
  Filled 2022-04-28: qty 5000

## 2022-04-28 MED ORDER — PROPOFOL 10 MG/ML IV BOLUS
INTRAVENOUS | Status: AC
  Filled 2022-04-28: qty 20

## 2022-04-28 MED ORDER — PROPOFOL 10 MG/ML IV BOLUS
INTRAVENOUS | Status: DC | PRN
  Administered 2022-04-28: 150 mg via INTRAVENOUS

## 2022-04-28 MED ORDER — LABETALOL HCL 5 MG/ML IV SOLN: 10.0000 mg | INTRAVENOUS | Status: DC | PRN

## 2022-04-28 MED ORDER — PANTOPRAZOLE SODIUM 40 MG IV SOLR: 40.0000 mg | Freq: Every day | INTRAVENOUS | Status: DC

## 2022-04-28 MED ORDER — VANCOMYCIN HCL 1250 MG/250ML IV SOLN
1250.0000 mg | INTRAVENOUS | Status: DC
  Administered 2022-04-29: 1250 mg via INTRAVENOUS
  Filled 2022-04-28: qty 250

## 2022-04-28 MED ORDER — BACITRACIN ZINC 500 UNIT/GM EX OINT
TOPICAL_OINTMENT | CUTANEOUS | Status: DC | PRN
  Administered 2022-04-28: 1 via TOPICAL

## 2022-04-28 MED ORDER — ALBUTEROL SULFATE (2.5 MG/3ML) 0.083% IN NEBU
INHALATION_SOLUTION | RESPIRATORY_TRACT | Status: AC
  Filled 2022-04-28: qty 3

## 2022-04-28 MED ORDER — SUGAMMADEX SODIUM 200 MG/2ML IV SOLN
INTRAVENOUS | Status: DC | PRN
  Administered 2022-04-28: 200 mg via INTRAVENOUS

## 2022-04-28 MED ORDER — HYDROMORPHONE HCL 1 MG/ML IJ SOLN
0.2500 mg | INTRAMUSCULAR | Status: DC | PRN
  Administered 2022-04-28 (×2): 0.5 mg via INTRAVENOUS

## 2022-04-28 MED ORDER — HYDROCODONE-ACETAMINOPHEN 5-325 MG PO TABS: 1.0000 | ORAL_TABLET | ORAL | Status: DC | PRN

## 2022-04-28 MED ORDER — LIDOCAINE 2% (20 MG/ML) 5 ML SYRINGE
INTRAMUSCULAR | Status: DC | PRN
  Administered 2022-04-28: 20 mg via INTRAVENOUS
  Administered 2022-04-28: 80 mg via INTRAVENOUS

## 2022-04-28 MED ORDER — LIDOCAINE 2% (20 MG/ML) 5 ML SYRINGE
INTRAMUSCULAR | Status: AC
  Filled 2022-04-28: qty 5

## 2022-04-28 MED ORDER — ONDANSETRON HCL 4 MG/2ML IJ SOLN
INTRAMUSCULAR | Status: DC | PRN
  Administered 2022-04-28: 4 mg via INTRAVENOUS

## 2022-04-28 MED ORDER — HYDROMORPHONE HCL 1 MG/ML IJ SOLN: 0.5000 mg | INTRAMUSCULAR | Status: DC | PRN

## 2022-04-28 MED ORDER — OXYCODONE HCL 5 MG PO TABS: 5.0000 mg | ORAL_TABLET | Freq: Once | ORAL | Status: DC | PRN

## 2022-04-28 MED ORDER — AMISULPRIDE (ANTIEMETIC) 5 MG/2ML IV SOLN: 10.0000 mg | Freq: Once | INTRAVENOUS | Status: DC | PRN

## 2022-04-28 MED ORDER — ALBUTEROL SULFATE (2.5 MG/3ML) 0.083% IN NEBU: 2.5000 mg | INHALATION_SOLUTION | Freq: Four times a day (QID) | RESPIRATORY_TRACT | Status: DC | PRN

## 2022-04-28 MED ORDER — THROMBIN 20000 UNITS EX SOLR
CUTANEOUS | Status: DC | PRN
  Administered 2022-04-28: 20 mL via TOPICAL

## 2022-04-28 MED ORDER — ADULT MULTIVITAMIN W/MINERALS CH: 1.0000 | ORAL_TABLET | Freq: Every day | ORAL | Status: DC

## 2022-04-28 MED ORDER — PHENYLEPHRINE 80 MCG/ML (10ML) SYRINGE FOR IV PUSH (FOR BLOOD PRESSURE SUPPORT)
PREFILLED_SYRINGE | INTRAVENOUS | Status: AC
  Filled 2022-04-28: qty 20

## 2022-04-28 MED ORDER — ROCURONIUM BROMIDE 10 MG/ML (PF) SYRINGE
PREFILLED_SYRINGE | INTRAVENOUS | Status: DC | PRN
  Administered 2022-04-28: 60 mg via INTRAVENOUS
  Administered 2022-04-28: 40 mg via INTRAVENOUS

## 2022-04-28 MED ORDER — LIDOCAINE-EPINEPHRINE 1 %-1:100000 IJ SOLN
INTRAMUSCULAR | Status: AC
  Filled 2022-04-28: qty 1

## 2022-04-28 MED ORDER — FENTANYL CITRATE (PF) 100 MCG/2ML IJ SOLN
INTRAMUSCULAR | Status: DC | PRN
  Administered 2022-04-28: 50 ug via INTRAVENOUS
  Administered 2022-04-28: 100 ug via INTRAVENOUS
  Administered 2022-04-28 (×2): 50 ug via INTRAVENOUS

## 2022-04-28 MED ORDER — DIVALPROEX SODIUM 250 MG PO DR TAB
250.0000 mg | DELAYED_RELEASE_TABLET | Freq: Two times a day (BID) | ORAL | Status: DC
  Administered 2022-04-28 – 2022-04-29 (×2): 250 mg via ORAL
  Filled 2022-04-28 (×2): qty 1

## 2022-04-28 MED ORDER — ONDANSETRON HCL 4 MG/2ML IJ SOLN
INTRAMUSCULAR | Status: AC
  Filled 2022-04-28: qty 2

## 2022-04-28 MED ORDER — HYDROMORPHONE HCL 1 MG/ML IJ SOLN
INTRAMUSCULAR | Status: AC
  Filled 2022-04-28: qty 1

## 2022-04-28 MED ORDER — ACETAMINOPHEN 10 MG/ML IV SOLN: 1000.0000 mg | Freq: Once | INTRAVENOUS | Status: DC | PRN

## 2022-04-28 MED ORDER — MEGESTROL ACETATE 400 MG/10ML PO SUSP
400.0000 mg | Freq: Every day | ORAL | Status: DC
  Filled 2022-04-28 (×2): qty 10

## 2022-04-28 MED ORDER — ORAL CARE MOUTH RINSE: 15.0000 mL | Freq: Once | OROMUCOSAL | Status: AC

## 2022-04-28 MED ORDER — LACTATED RINGERS IV SOLN: INTRAVENOUS | Status: DC

## 2022-04-28 SURGICAL SUPPLY — 81 items
ADH SKN CLS APL DERMABOND .7 (GAUZE/BANDAGES/DRESSINGS) ×1
BAG COUNTER SPONGE SURGICOUNT (BAG) ×2 IMPLANT
BAG SPNG CNTER NS LX DISP (BAG) ×1
BLADE CLIPPER SURG (BLADE) ×2 IMPLANT
BNDG CMPR 75X41 PLY ABS (GAUZE/BANDAGES/DRESSINGS) ×1
BNDG CMPR 75X41 PLY HI ABS (GAUZE/BANDAGES/DRESSINGS)
BNDG COHESIVE 4X5 TAN STRL (GAUZE/BANDAGES/DRESSINGS) IMPLANT
BNDG GAUZE DERMACEA FLUFF 4 (GAUZE/BANDAGES/DRESSINGS) IMPLANT
BNDG GZE DERMACEA 4 6PLY (GAUZE/BANDAGES/DRESSINGS) ×1
BNDG STRETCH 4X75 NS LF (GAUZE/BANDAGES/DRESSINGS) IMPLANT
BNDG STRETCH 4X75 STRL LF (GAUZE/BANDAGES/DRESSINGS) IMPLANT
BUR SPIRAL ROUTER 2.3 (BUR) IMPLANT
CABLE BIPOLOR RESECTION CORD (MISCELLANEOUS) ×2 IMPLANT
CANISTER SUCT 3000ML PPV (MISCELLANEOUS) ×2 IMPLANT
CLIP VESOCCLUDE MED 6/CT (CLIP) IMPLANT
DERMABOND ADVANCED .7 DNX12 (GAUZE/BANDAGES/DRESSINGS) ×2 IMPLANT
DRAPE INCISE IOBAN 66X45 STRL (DRAPES) IMPLANT
DRAPE NEUROLOGICAL W/INCISE (DRAPES) ×2 IMPLANT
DRAPE SURG 17X23 STRL (DRAPES) ×4 IMPLANT
DRSG OPSITE POSTOP 4X6 (GAUZE/BANDAGES/DRESSINGS) IMPLANT
ELECT CAUTERY BLADE 6.4 (BLADE) ×2 IMPLANT
ELECT REM PT RETURN 9FT ADLT (ELECTROSURGICAL) ×1
ELECTRODE REM PT RTRN 9FT ADLT (ELECTROSURGICAL) ×2 IMPLANT
EVACUATOR SILICONE 100CC (DRAIN) IMPLANT
GAUZE 4X4 16PLY ~~LOC~~+RFID DBL (SPONGE) IMPLANT
GAUZE SPONGE 4X4 12PLY STRL (GAUZE/BANDAGES/DRESSINGS) ×2 IMPLANT
GLOVE BIO SURGEON STRL SZ 6.5 (GLOVE) IMPLANT
GLOVE BIO SURGEON STRL SZ7 (GLOVE) IMPLANT
GLOVE BIO SURGEON STRL SZ7.5 (GLOVE) IMPLANT
GLOVE BIO SURGEON STRL SZ8 (GLOVE) ×2 IMPLANT
GLOVE BIO SURGEON STRL SZ8.5 (GLOVE) IMPLANT
GLOVE BIOGEL M 8.0 STRL (GLOVE) IMPLANT
GLOVE BIOGEL PI IND STRL 7.0 (GLOVE) IMPLANT
GLOVE BIOGEL PI IND STRL 7.5 (GLOVE) IMPLANT
GLOVE ECLIPSE 6.5 STRL STRAW (GLOVE) IMPLANT
GLOVE ECLIPSE 7.0 STRL STRAW (GLOVE) IMPLANT
GLOVE ECLIPSE 7.5 STRL STRAW (GLOVE) IMPLANT
GLOVE ECLIPSE 8.0 STRL XLNG CF (GLOVE) IMPLANT
GLOVE ECLIPSE 8.5 STRL (GLOVE) IMPLANT
GLOVE EXAM NITRILE XL STR (GLOVE) IMPLANT
GLOVE INDICATOR 8.5 STRL (GLOVE) ×4 IMPLANT
GLOVE OPTIFIT SS 8.0 STRL (GLOVE) IMPLANT
GLOVE SURG SS PI 6.5 STRL IVOR (GLOVE) IMPLANT
GLOVE SURG SS PI 7.5 STRL IVOR (GLOVE) IMPLANT
GOWN STRL REUS W/ TWL LRG LVL3 (GOWN DISPOSABLE) ×2 IMPLANT
GOWN STRL REUS W/ TWL XL LVL3 (GOWN DISPOSABLE) ×2 IMPLANT
GOWN STRL REUS W/TWL 2XL LVL3 (GOWN DISPOSABLE) ×2 IMPLANT
GOWN STRL REUS W/TWL LRG LVL3 (GOWN DISPOSABLE) ×1
GOWN STRL REUS W/TWL XL LVL3 (GOWN DISPOSABLE) ×2
HEMOSTAT SURGICEL 2X14 (HEMOSTASIS) IMPLANT
KIT BASIN OR (CUSTOM PROCEDURE TRAY) ×2 IMPLANT
KIT TURNOVER KIT B (KITS) ×2 IMPLANT
NEEDLE HYPO 22GX1.5 SAFETY (NEEDLE) ×2 IMPLANT
NS IRRIG 1000ML POUR BTL (IV SOLUTION) ×2 IMPLANT
PACK BATTERY CMF DISP FOR DVR (ORTHOPEDIC DISPOSABLE SUPPLIES) IMPLANT
PACK CRANIOTOMY CUSTOM (CUSTOM PROCEDURE TRAY) ×2 IMPLANT
PAD ARMBOARD 7.5X6 YLW CONV (MISCELLANEOUS) ×6 IMPLANT
PATTIES SURGICAL .25X.25 (GAUZE/BANDAGES/DRESSINGS) IMPLANT
PATTIES SURGICAL .5 X.5 (GAUZE/BANDAGES/DRESSINGS) IMPLANT
PATTIES SURGICAL .5 X3 (DISPOSABLE) IMPLANT
PATTIES SURGICAL .75X.75 (GAUZE/BANDAGES/DRESSINGS) IMPLANT
PATTIES SURGICAL 1X1 (DISPOSABLE) IMPLANT
PIN MAYFIELD SKULL DISP (PIN) IMPLANT
PLATE DOUBLE Y CMF 6H (Plate) IMPLANT
SCREW UNIII AXS SD 1.5X4 (Screw) IMPLANT
SPIKE FLUID TRANSFER (MISCELLANEOUS) ×2 IMPLANT
SPONGE NEURO XRAY DETECT 1X3 (DISPOSABLE) IMPLANT
SPONGE SURGIFOAM ABS GEL 100 (HEMOSTASIS) IMPLANT
SPONGE SURGIFOAM ABS GEL SZ50 (HEMOSTASIS) IMPLANT
STAPLER SKIN PROX WIDE 3.9 (STAPLE) ×2 IMPLANT
STAPLER VISISTAT (STAPLE) IMPLANT
SUT NURALON 4 0 TR CR/8 (SUTURE) IMPLANT
SUT VIC AB 2-0 CT1 18 (SUTURE) ×6 IMPLANT
SUT VICRYL 4-0 PS2 18IN ABS (SUTURE) ×2 IMPLANT
SYR CONTROL 10ML LL (SYRINGE) ×2 IMPLANT
TOWEL GREEN STERILE (TOWEL DISPOSABLE) ×2 IMPLANT
TOWEL GREEN STERILE FF (TOWEL DISPOSABLE) ×2 IMPLANT
TRAY FOLEY MTR SLVR 14FR STAT (SET/KITS/TRAYS/PACK) IMPLANT
TRAY FOLEY MTR SLVR 16FR STAT (SET/KITS/TRAYS/PACK) IMPLANT
UNDERPAD 30X36 HEAVY ABSORB (UNDERPADS AND DIAPERS) IMPLANT
WATER STERILE IRR 1000ML POUR (IV SOLUTION) ×2 IMPLANT

## 2022-04-28 NOTE — Anesthesia Procedure Notes (Signed)
Procedure Name: Intubation Date/Time: 04/28/2022 7:42 AM  Performed by: Barrington Ellison, CRNAPre-anesthesia Checklist: Patient identified, Emergency Drugs available, Suction available and Patient being monitored Patient Re-evaluated:Patient Re-evaluated prior to induction Oxygen Delivery Method: Circle System Utilized Preoxygenation: Pre-oxygenation with 100% oxygen Induction Type: IV induction Ventilation: Mask ventilation without difficulty Laryngoscope Size: Mac and 3 Grade View: Grade I Tube type: Oral Tube size: 7.0 mm Number of attempts: 1 Airway Equipment and Method: Stylet and Oral airway Placement Confirmation: ETT inserted through vocal cords under direct vision, positive ETCO2 and breath sounds checked- equal and bilateral Secured at: 21 cm Tube secured with: Tape Dental Injury: Teeth and Oropharynx as per pre-operative assessment

## 2022-04-28 NOTE — Transfer of Care (Signed)
Immediate Anesthesia Transfer of Care Note  Patient: Kayla Maynard  Procedure(s) Performed: Reimplantation of cranial flap  Patient Location: PACU  Anesthesia Type:General  Level of Consciousness: awake, alert , and oriented  Airway & Oxygen Therapy: Patient Spontanous Breathing  Post-op Assessment: Report given to RN  Post vital signs: Reviewed and stable  Last Vitals:  Vitals Value Taken Time  BP 146/78 04/28/22 0926  Temp    Pulse 100 04/28/22 0930  Resp 15 04/28/22 0930  SpO2 98 % 04/28/22 0930  Vitals shown include unvalidated device data.  Last Pain:  Vitals:   04/28/22 0627  TempSrc:   PainSc: 3          Complications: No notable events documented.

## 2022-04-28 NOTE — Progress Notes (Signed)
Pharmacy Antibiotic Note  Kayla Maynard is a 66 y.o. female admitted on 04/28/2022 with surgical prophylaxis.  Pharmacy has been consulted for vanc dosing.  Pt is s/p Left frontal parietal plate cranioplasty with harvesting of cranial flap with intracath in place. Order for 7d of post op prophylaxis.   Target trough 10-15  Plan: Bmet in AM Vanc 1.25g IV q24 x7 Adjust dose if needed  Height: 5\' 6"  (167.6 cm) Weight: 70.8 kg (156 lb) IBW/kg (Calculated) : 59.3  Temp (24hrs), Avg:97.7 F (36.5 C), Min:97.5 F (36.4 C), Max:97.8 F (36.6 C)  No results for input(s): "WBC", "CREATININE", "LATICACIDVEN", "VANCOTROUGH", "VANCOPEAK", "VANCORANDOM", "GENTTROUGH", "GENTPEAK", "GENTRANDOM", "TOBRATROUGH", "TOBRAPEAK", "TOBRARND", "AMIKACINPEAK", "AMIKACINTROU", "AMIKACIN" in the last 168 hours.  Estimated Creatinine Clearance: 55.4 mL/min (by C-G formula based on SCr of 0.96 mg/dL).    Allergies  Allergen Reactions   Keflex [Cephalexin] Hives and Swelling   Morphine     Unknown reaction    Penicillins Hives and Swelling    , PharmD, BCIDP, AAHIVP, CPP Infectious Disease Pharmacist 04/28/2022 7:00 PM

## 2022-04-28 NOTE — Progress Notes (Signed)
Pt highly resistant to taking medications. This RN convinced pt to take 2200 medications for seizure prophylaxis, and pt has agreed to take scheduled 1000 seizure medications. Pt has also agreed to take prophylactic antibiotics. However, pt is very reluctant to continue taking ANY medications past 1000. She has already stated that she will not take any medications at home, and she does not want to have to taper off of any medications that she is being given at the hospital. Encouraged pt to continue having discussions about meds/ asking questions to her nurses and the MD when he rounds. Will continue to provide education on ordered medications.

## 2022-04-28 NOTE — Progress Notes (Addendum)
Received patient following reports from PACU Nurse Lillia Abed. Patient in chair upon arrival. Transferred to bed with minimal assist. Patient A & O x 4. MAE 5/5. Vital Signs WNL. On Room Air. With remaining dinner at bedside. Oriented to unit. Call bell within reach. Dressing to cranium cling gauze C/D/I. No skin concerns. Has foley catheter- requesting removal, will reevaluate and likely remove in the AM. Patient reports she is comfortable and doesn't need pain medication/treatment.

## 2022-04-28 NOTE — Anesthesia Postprocedure Evaluation (Signed)
Anesthesia Post Note  Patient: Emmali Karow  Procedure(s) Performed: Reimplantation of cranial flap     Patient location during evaluation: PACU Anesthesia Type: General Level of consciousness: awake and alert Pain management: pain level controlled Vital Signs Assessment: post-procedure vital signs reviewed and stable Respiratory status: spontaneous breathing, nonlabored ventilation, respiratory function stable and patient connected to nasal cannula oxygen Cardiovascular status: blood pressure returned to baseline and stable Postop Assessment: no apparent nausea or vomiting Anesthetic complications: no  No notable events documented.  Last Vitals:  Vitals:   04/28/22 1330 04/28/22 1430  BP: 101/73 104/78  Pulse: 76 84  Resp: 13 15  Temp:    SpO2: 98% 94%    Last Pain:  Vitals:   04/28/22 1430  TempSrc:   PainSc: 0-No pain                 Trevor Iha

## 2022-04-28 NOTE — Op Note (Signed)
Preoperative diagnosis: Cranial defect.  Postoperative diagnosis: Same.  Procedure: Left frontal parietal plate cranioplasty with harvesting of cranial flap previously implanted in the abdominal wall and replacement into the left frontoparietal skull defect..  Surgeon: Donalee Citrin.  Assistant: Julien Girt.  Anesthesia: General.  EBL: Minimal.  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  Patient recovered and is now 6 months postoperatively and presents for replacement of cranial flap.  I extensively gone over the risks and benefits of the operation with her as well as perioperative course expectations of outcome and alternatives to surgery and she understood and agreed to proceed forward.Marland Kitchen  Operative procedure: Patient was brought into the OR was induced under general anesthesia positioned supine with a shoulder bump under her left shoulder exposing the left side of her frontoparietal area as well as her left abdominal wall.  Both previous incisions were then shaved prepped and draped in routine sterile fashion.  Concurrently my assistant opened up the abdominal wall cavity harvested the bone flap and closed primarily with interrupted Vicryl and a running 4 subcuticular concurrently with this I infiltrated the scalp incision and opened this up and and identified the scar tissue and the native dura and dissected the scalp scalp flap off the craniotomy defect.  Exposed all margins of the craniotomy then brought the bone flap back into the field and attached it with 3X plates from the [facility] plating system.  Then the wound was copiously irrigated meticulous hemostasis was maintained I reapproximated the scalp with interrupted Vicryl and and staples in the skin.  Both wounds were dressed at the end of the case all needle count sponge counts were correct and patient went to cover room in stable  condition.  At the end the case all needle counts and sponge counts were correct.

## 2022-04-28 NOTE — Plan of Care (Signed)
  Problem: Education: Goal: Knowledge of the prescribed therapeutic regimen will improve Outcome: Progressing   Problem: Clinical Measurements: Goal: Usual level of consciousness will be regained or maintained. Outcome: Progressing Goal: Neurologic status will improve Outcome: Progressing Goal: Ability to maintain intracranial pressure will improve Outcome: Progressing   Problem: Skin Integrity: Goal: Demonstration of wound healing without infection will improve Outcome: Progressing   Problem: Education: Goal: Knowledge of General Education information will improve Description: Including pain rating scale, medication(s)/side effects and non-pharmacologic comfort measures Outcome: Progressing   Problem: Health Behavior/Discharge Planning: Goal: Ability to manage health-related needs will improve Outcome: Progressing   Problem: Clinical Measurements: Goal: Ability to maintain clinical measurements within normal limits will improve Outcome: Progressing Goal: Will remain free from infection Outcome: Progressing Goal: Diagnostic test results will improve Outcome: Progressing Goal: Respiratory complications will improve Outcome: Progressing Goal: Cardiovascular complication will be avoided Outcome: Progressing   Problem: Activity: Goal: Risk for activity intolerance will decrease Outcome: Progressing   Problem: Nutrition: Goal: Adequate nutrition will be maintained Outcome: Progressing   Problem: Coping: Goal: Level of anxiety will decrease Outcome: Progressing   Problem: Elimination: Goal: Will not experience complications related to urinary retention Outcome: Progressing   Problem: Pain Managment: Goal: General experience of comfort will improve Outcome: Progressing   Problem: Safety: Goal: Ability to remain free from injury will improve Outcome: Progressing   Problem: Skin Integrity: Goal: Risk for impaired skin integrity will decrease Outcome:  Progressing   Problem: Elimination: Goal: Will not experience complications related to bowel motility Outcome: Not Progressing

## 2022-04-28 NOTE — H&P (Signed)
Kayla Maynard is an 66 y.o. female.   Chief Complaint: Cranial defect HPI: 66 year old female presented with a ruptured aneurysm and subdural hematoma.  Patient was emergently taken to interventional radiology underwent coiling of the aneurysm and evacuation of the subdural hematoma.  Due to the cerebral edema the skull flap was implanted in the abdominal wall.  Patient now presents for reimplantation of cranial flap harvesting from abdominal wall and reimplantation left frontal parietal.  I extensively went over the risks and benefits of the operation with her as well as perioperative course expectations of outcome and alternatives to surgery and she understands and agrees to proceed forward.  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, s/p coil embolization, left craniotomy for evacuation of SDH 08/27/21   Myocardial infarction (HCC)    04/30/02 due to 50% mid LAD with ruptured plaque, s/p PTCA/stent    Past Surgical History:  Procedure Laterality Date   ABDOMINAL HYSTERECTOMY     BACK SURGERY     Dr Jordan Likes   CORONARY ANGIOPLASTY  04/30/2002   CRANIOTOMY Left 08/27/2021   Procedure: CRANIOTOMY FOR EVACUATION OF SUBDURAL HEMATOMA WITH PLACEMENT OF BONE FLAP IN ABDOMEN;  Surgeon: Donalee Citrin, MD;  Location: [facility] OR;  Service: Neurosurgery;  Laterality: Left;   CRANIOTOMY Left 11/23/2021   Procedure: DEBRIDEMENT OF LEFT FRONTAL CRANIAL WOUND;  Surgeon: Donalee Citrin, MD;  Location: [facility];  Service: Neurosurgery;  Laterality: Left;   IR ANGIO INTRA EXTRACRAN SEL COM CAROTID INNOMINATE UNI R MOD SED  08/27/2021   IR ANGIO INTRA EXTRACRAN SEL INTERNAL CAROTID UNI L MOD SED  08/30/2021   IR ANGIO VERTEBRAL SEL SUBCLAVIAN INNOMINATE UNI R MOD SED  08/30/2021   IR ANGIOGRAM FOLLOW UP STUDY  08/27/2021   IR ANGIOGRAM FOLLOW UP STUDY   08/27/2021   IR ANGIOGRAM FOLLOW UP STUDY  08/27/2021   IR ANGIOGRAM FOLLOW UP STUDY  08/27/2021   IR ANGIOGRAM FOLLOW UP STUDY  08/27/2021   IR ANGIOGRAM FOLLOW UP STUDY  08/27/2021   IR ANGIOGRAM FOLLOW UP STUDY  08/27/2021   IR CT HEAD LTD  08/27/2021   IR NEURO EACH ADD'L AFTER BASIC UNI LEFT (MS)  08/30/2021   IR RADIOLOGIST EVAL & MGMT  10/18/2021   IR TRANSCATH/EMBOLIZ  08/27/2021   RADIOLOGY WITH ANESTHESIA N/A 08/27/2021   Procedure: RADIOLOGY WITH ANESTHESIA;  Surgeon: Julieanne Cotton, MD;  Location: [facility] OR;  Service: Radiology;  Laterality: N/A;   STENT PLACEMENT VASCULAR ([facility] HX)      History reviewed. No pertinent family history. Social History:  reports that she quit smoking about 8 months ago. Her smoking use included cigarettes. She smoked an average of 1 pack per day. She has never used smokeless tobacco. She reports that she does not currently use alcohol. She reports that she does not use drugs.  Allergies:  Allergies  Allergen Reactions   Keflex [Cephalexin] Hives and Swelling   Morphine     Unknown reaction    Penicillins Hives and Swelling    Medications Prior to Admission  Medication Sig Dispense Refill   acetaminophen (TYLENOL) 325 MG tablet Take 1-2 tablets (325-650 mg total) by mouth every 4 (four) hours as needed for mild pain. (Patient not taking: Reported on 11/30/2021)     divalproex (DEPAKOTE) 250 MG  DR tablet Take 1 tablet (250 mg total) by mouth every 12 (twelve) hours. (Patient not taking: Reported on 11/30/2021) 60 tablet 2   doxycycline (VIBRA-TABS) 100 MG tablet Take 1 tablet (100 mg total) by mouth 2 (two) times daily. (Patient not taking: Reported on 04/18/2022) 20 tablet 0   HYDROcodone-acetaminophen (NORCO/VICODIN) 5-325 MG tablet Take 1 tablet by mouth every 4 (four) hours as needed for moderate pain. (Patient not taking: Reported on 11/30/2021) 20 tablet 0   hydrocortisone cream 1 % Apply topically 4 (four) times daily as needed for itching.  (Patient not taking: Reported on 11/30/2021) 30 g 0   megestrol (MEGACE) 400 MG/10ML suspension Take 10 mLs (400 mg total) by mouth daily. (Patient not taking: Reported on 11/30/2021) 480 mL 0   Melatonin 10 MG TABS Take 10 mg by mouth at bedtime. (Patient not taking: Reported on 11/30/2021) 30 tablet 0   methylphenidate (RITALIN) 5 MG tablet Take 1 tablet (5 mg total) by mouth 2 (two) times daily with breakfast and lunch. (Patient not taking: Reported on 11/30/2021) 34 tablet 0   methylphenidate (RITALIN) 5 MG tablet Take 1 tablet (5 mg total) by mouth 2 (two) times daily with breakfast and lunch. (Patient not taking: Reported on 11/30/2021) 60 tablet 0   Multiple Vitamin (MULTIVITAMIN WITH MINERALS) TABS tablet Take 1 tablet by mouth daily. (Patient not taking: Reported on 11/30/2021)     topiramate (TOPAMAX) 25 MG tablet Take 1 tablet (25 mg total) by mouth at bedtime. (Patient not taking: Reported on 11/30/2021) 30 tablet 0    No results found for this or any previous visit (from the past 48 hour(s)). No results found.  Review of Systems  Neurological:  Positive for headaches.    Blood pressure 118/80, pulse 85, temperature (!) 97.5 F (36.4 C), temperature source Oral, resp. rate 18, height 5\' 6"  (1.676 m), weight 70.8 kg, SpO2 98 %. Physical Exam HENT:     Head: Normocephalic.     Right Ear: Tympanic membrane normal.     Nose: Nose normal.     Mouth/Throat:     Mouth: Mucous membranes are moist.  Eyes:     Pupils: Pupils are equal, round, and reactive to light.  Cardiovascular:     Rate and Rhythm: Normal rate.  Pulmonary:     Effort: Pulmonary effort is normal.  Abdominal:     General: Abdomen is flat.  Musculoskeletal:        General: Normal range of motion.     Cervical back: Normal range of motion.  Skin:    General: Skin is warm.  Neurological:     Mental Status: She is alert.     Comments: Patient is awake and alert pupils are equal and reactive extract movements are intact  neurologically she is stable does have a left-sided frontal parietal cranial defect      Assessment/Plan 66 year old presents for reimplantation of left-sided cranial flap  Elaina Hoops, MD 04/28/2022, 7:23 AM

## 2022-04-29 LAB — BASIC METABOLIC PANEL
Anion gap: 7 (ref 5–15)
BUN: 17 mg/dL (ref 8–23)
CO2: 23 mmol/L (ref 22–32)
Calcium: 8.5 mg/dL — ABNORMAL LOW (ref 8.9–10.3)
Chloride: 107 mmol/L (ref 98–111)
Creatinine, Ser: 0.96 mg/dL (ref 0.44–1.00)
GFR, Estimated: >60 mL/min (ref >60)
Glucose, Bld: 116 mg/dL — ABNORMAL HIGH (ref 70–99)
Potassium: 4.6 mmol/L (ref 3.5–5.1)
Sodium: 137 mmol/L (ref 135–145)

## 2022-04-29 MED ORDER — ORAL CARE MOUTH RINSE: 15.0000 mL | OROMUCOSAL | Status: DC | PRN

## 2022-04-29 NOTE — Discharge Summary (Signed)
  Physician Discharge Summary  Patient ID: Kayla Maynard MRN: [id number] DOB/AGE: Sep 03, 1955 66 y.o. Estimated body mass index is 25.18 kg/m as calculated from the following:   Height as of this encounter: 5\' 6"  (1.676 m).   Weight as of this encounter: 70.8 kg.   Admit date: 04/28/2022 Discharge date: 04/29/2022  Admission Diagnoses: Cranial defect  Discharge Diagnoses: Same Principal Problem:   Cranial anomaly Active Problems:   Skull deformity   Discharged Condition: good  Hospital Course: Patient was admitted to hospital underwent reimplantation of cranial flap.  Postoperatively patient did very well with covering the floor on the floor was ambulating and voiding spontaneously tolerating regular diet and stable for discharge home.  Patient be discharged with scheduled follow-up in 1 to 2 weeks.  Consults: Significant Diagnostic Studies: Treatments: Reimplantation of cranial flap Discharge Exam: Blood pressure 107/71, pulse 89, temperature 98.1 F (36.7 C), temperature source Oral, resp. rate 13, height 5\' 6"  (1.676 m), weight 70.8 kg, SpO2 95 %. Awake alert neurologically intact  Disposition: Home   Allergies as of 04/29/2022       Reactions   Keflex [cephalexin] Hives, Swelling   Morphine    Unknown reaction    Penicillins Hives, Swelling        Medication List     TAKE these medications    acetaminophen 325 MG tablet Commonly known as: TYLENOL Take 1-2 tablets (325-650 mg total) by mouth every 4 (four) hours as needed for mild pain.   divalproex 250 MG DR tablet Commonly known as: DEPAKOTE Take 1 tablet (250 mg total) by mouth every 12 (twelve) hours.   doxycycline 100 MG tablet Commonly known as: VIBRA-TABS Take 1 tablet (100 mg total) by mouth 2 (two) times daily.   HYDROcodone-acetaminophen 5-325 MG tablet Commonly known as: NORCO/VICODIN Take 1 tablet by mouth every 4 (four) hours as needed for moderate pain.   hydrocortisone cream 1  % Apply topically 4 (four) times daily as needed for itching.   megestrol 400 MG/10ML suspension Commonly known as: MEGACE Take 10 mLs (400 mg total) by mouth daily.   Melatonin 10 MG Tabs Take 10 mg by mouth at bedtime.   methylphenidate 5 MG tablet Commonly known as: Ritalin Take 1 tablet (5 mg total) by mouth 2 (two) times daily with breakfast and lunch.   methylphenidate 5 MG tablet Commonly known as: RITALIN Take 1 tablet (5 mg total) by mouth 2 (two) times daily with breakfast and lunch.   multivitamin with minerals Tabs tablet Take 1 tablet by mouth daily.   topiramate 25 MG tablet Commonly known as: TOPAMAX Take 1 tablet (25 mg total) by mouth at bedtime.         Signed: 04/29/2022, 9:24 AM

## 2022-04-29 NOTE — Progress Notes (Signed)
Dr. Wynetta Emery saw patient this AM, the patient insisting to go home. The patient is very stable neurologically. Incision to head OTA without drainage- staples intact, closed incision margins, no redness.  Patient with great appetite- no difficult. Performed all her own ADLs this AM. Walking within room with no unsteadiness. No complaints of pain. Says she does not take any medications at home but agreed to take her anti seizure medications until directed by Neurosurgery to stop these.  PIV removed.  Belongings with the patient.  Called her son to pick her up.  She lives home alone with family close by.  Discharge instructions reviewed with the patient: Call [facility] to schedule f/u in 1-2 weeks. No head submerged in water over incision. No soaking water over the head. No vigorous scrubbing at incision site. Call doctor for fever greater than 101.5 degrees Farenheit; redness, swelling, excessive drainage at the incision site; change in neurological status including new weakness, numbness; worsening pain not controlled by Tylenol. No heavy lifting over 10 pounds. No frequent bending/stooping/pushing/pulling.  Patient understands and accepts instructions.  Patient refusing to take pain medication and reports she is uncomfortable.  Patient put on her home clothes prepared for discharge.

## 2022-05-02 ENCOUNTER — Encounter (HOSPITAL_COMMUNITY): Payer: Self-pay | Admitting: Neurosurgery

## 2023-10-26 ENCOUNTER — Encounter (HOSPITAL_COMMUNITY): Payer: Self-pay | Admitting: Interventional Radiology

## 2023-12-05 IMAGING — DX DG CHEST 1V PORT
1 series · 1 of 1 positions shown · non-contrast
Comparison: Chest radiograph 1 day prior

CLINICAL DATA: Evaluate pleural effusions, airspace disease,
pneumonia

EXAM:
PORTABLE CHEST 1 VIEW

[chest ap]
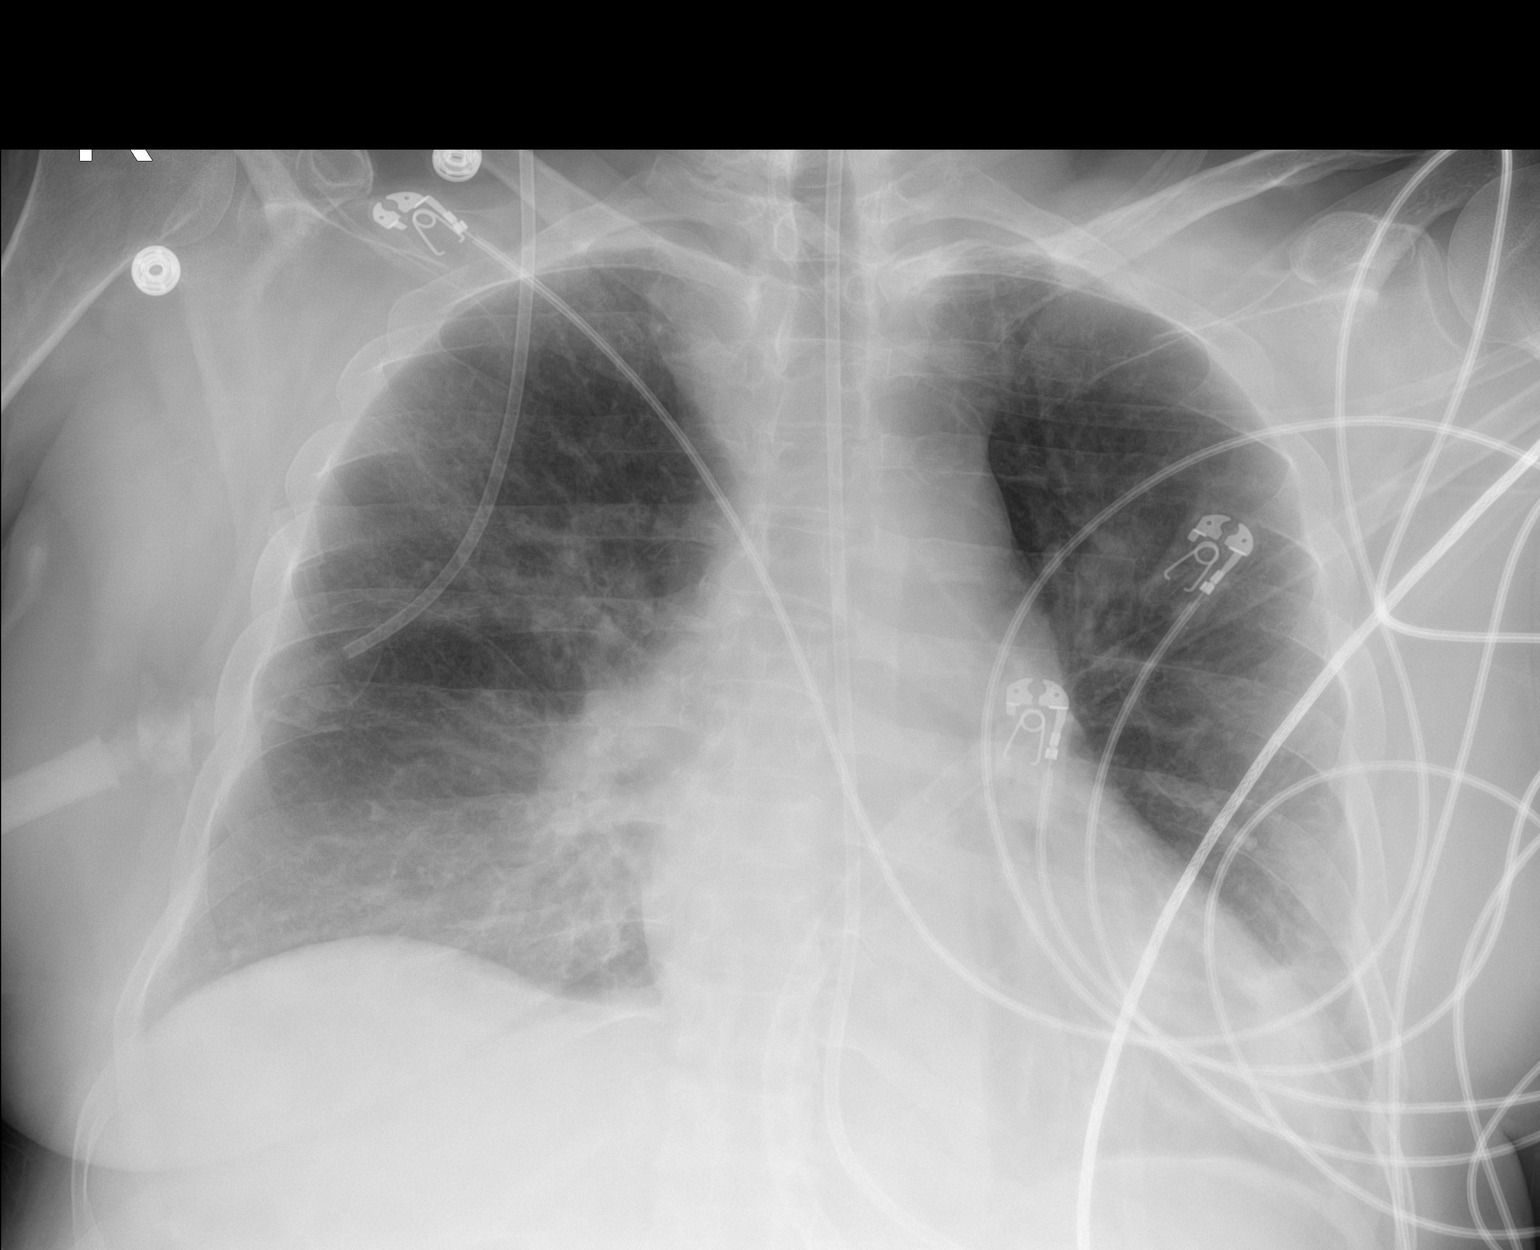

[1 of 1 positions shown; findings below may reference images not displayed]

FINDINGS: The enteric catheter tip is off the field of view. The
cardiomediastinal silhouette is stable.

The bilateral pleural effusions have decreased in size, with
improved aeration of the lung bases. There is no new or worsening
focal airspace disease. There is no pneumothorax

There is no acute osseous abnormality.
IMPRESSION: Decreased size of the bilateral pleural effusions with improved
aeration of the lung bases. No new or worsening focal airspace
disease.

## 2023-12-09 IMAGING — DX DG CHEST 1V PORT
1 series · 1 of 1 positions shown · non-contrast
Comparison: 09/02/2021

CLINICAL DATA: Intracranial hemorrhage and possible pneumonia.

EXAM:
PORTABLE CHEST 1 VIEW

[chest ap]
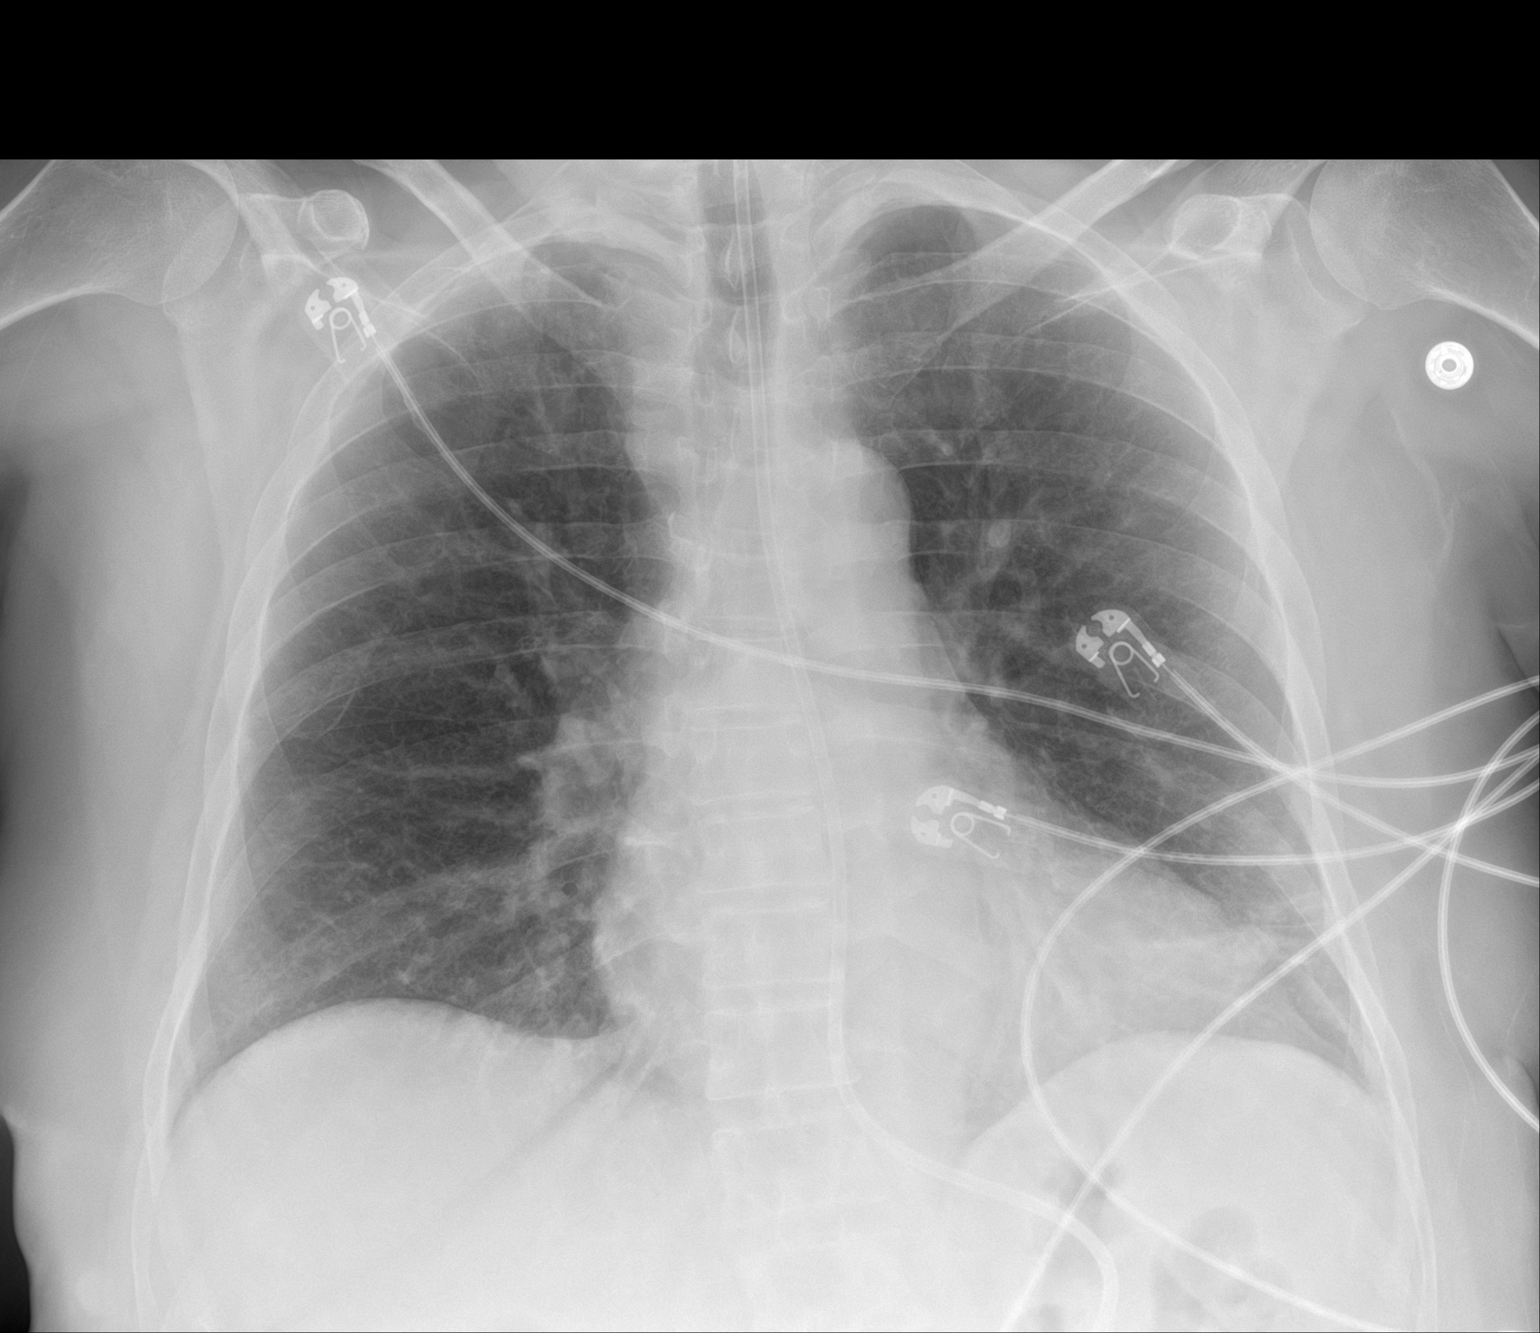

[1 of 1 positions shown; findings below may reference images not displayed]

FINDINGS: Stable heart size. Feeding tube extends below the diaphragm. Lungs
demonstrate continued improved aeration with minimal bibasilar
atelectasis remaining. No convincing pneumonia. No pulmonary edema,
pneumothorax or pleural fluid identified.
IMPRESSION: Continued improved aeration of both lungs with minimal bibasilar
atelectasis remaining. No convincing pneumonia identified.
